# Patient Record
Sex: Female | Born: 1945
Health system: Southern US, Community
[De-identification: ages and names within clinical notes are randomized; demographics above are authoritative.]

## PROBLEM LIST (undated history)

## (undated) DIAGNOSIS — I10 Essential (primary) hypertension: Secondary | ICD-10-CM

## (undated) DIAGNOSIS — K219 Gastro-esophageal reflux disease without esophagitis: Secondary | ICD-10-CM

## (undated) DIAGNOSIS — G4730 Sleep apnea, unspecified: Secondary | ICD-10-CM

## (undated) DIAGNOSIS — I2510 Atherosclerotic heart disease of native coronary artery without angina pectoris: Secondary | ICD-10-CM

## (undated) DIAGNOSIS — I4891 Unspecified atrial fibrillation: Secondary | ICD-10-CM

## (undated) DIAGNOSIS — E785 Hyperlipidemia, unspecified: Secondary | ICD-10-CM

## (undated) DIAGNOSIS — H269 Unspecified cataract: Secondary | ICD-10-CM

## (undated) DIAGNOSIS — E039 Hypothyroidism, unspecified: Secondary | ICD-10-CM

## (undated) DIAGNOSIS — Z8719 Personal history of other diseases of the digestive system: Secondary | ICD-10-CM

## (undated) DIAGNOSIS — C50919 Malignant neoplasm of unspecified site of unspecified female breast: Secondary | ICD-10-CM

## (undated) DIAGNOSIS — M1990 Unspecified osteoarthritis, unspecified site: Secondary | ICD-10-CM

## (undated) HISTORY — DX: Hyperlipidemia, unspecified: E78.5

## (undated) HISTORY — PX: CHOLECYSTECTOMY: SHX55

## (undated) HISTORY — DX: Malignant neoplasm of unspecified site of unspecified female breast: C50.919

## (undated) HISTORY — PX: MASTECTOMY: SHX3

## (undated) HISTORY — DX: Gastro-esophageal reflux disease without esophagitis: K21.9

## (undated) HISTORY — DX: Atherosclerotic heart disease of native coronary artery without angina pectoris: I25.10

## (undated) HISTORY — PX: TUBAL LIGATION: SHX77

## (undated) HISTORY — DX: Essential (primary) hypertension: I10

## (undated) HISTORY — DX: Unspecified atrial fibrillation: I48.91

## (undated) HISTORY — DX: Hypothyroidism, unspecified: E03.9

## (undated) HISTORY — PX: APPENDECTOMY: SHX54

---

## 2010-06-04 ENCOUNTER — Encounter: Payer: Self-pay | Admitting: Physician Assistant

## 2010-06-05 ENCOUNTER — Encounter: Payer: Self-pay | Admitting: Physician Assistant

## 2010-06-05 ENCOUNTER — Encounter: Payer: Self-pay | Admitting: Cardiology

## 2010-06-05 ENCOUNTER — Encounter: Payer: Self-pay | Admitting: *Deleted

## 2010-06-05 ENCOUNTER — Ambulatory Visit (INDEPENDENT_AMBULATORY_CARE_PROVIDER_SITE_OTHER): Payer: 59 | Admitting: Cardiology

## 2010-06-05 DIAGNOSIS — E039 Hypothyroidism, unspecified: Secondary | ICD-10-CM | POA: Insufficient documentation

## 2010-06-05 DIAGNOSIS — E785 Hyperlipidemia, unspecified: Secondary | ICD-10-CM | POA: Insufficient documentation

## 2010-06-05 DIAGNOSIS — R0602 Shortness of breath: Secondary | ICD-10-CM

## 2010-06-05 DIAGNOSIS — R079 Chest pain, unspecified: Secondary | ICD-10-CM | POA: Insufficient documentation

## 2010-06-05 LAB — PROTIME-INR

## 2010-06-05 MED ORDER — NITROGLYCERIN 0.4 MG SL SUBL: 0.4000 mg | SUBLINGUAL_TABLET | SUBLINGUAL | Status: DC | PRN

## 2010-06-05 MED ORDER — METOPROLOL SUCCINATE ER 25 MG PO TB24: 25.0000 mg | ORAL_TABLET | Freq: Every day | ORAL | Status: DC

## 2010-06-05 NOTE — Progress Notes (Addendum)
HPI:  Patient is a 65 year old female, with no known history of heart disease, now referred for evaluation of new onset, exertional chest discomfort.  Patient presents with cardiac risk factors notable only for age, history of dyslipidemia, and family history of premature CAD. She denies any prior formal cardiac evaluation.  Patient states that over the past 2 weeks, or so, she has noted new-onset anterior chest discomfort with associated left bicep pain, precipitated by doing yard work. Symptoms typically resolve after 20 minutes. She denies any symptoms at rest. She denies any associated dyspnea, diaphoresis, nausea, or radiation to the jaw. She denies any similarity to occasional reflux symptoms. Of note, patient denies any similar symptoms precipitated by her usual walking, or household chores.    No Known Allergies  No current outpatient prescriptions on file prior to visit.    Past Medical History  Diagnosis Date  . Hypothyroidism   . Breast cancer   . Hyperlipidemia   . GERD (gastroesophageal reflux disease)     Past Surgical History  Procedure Date  . Mastectomy      left, 1997; S/P chemotherapy/radiation    History   Social History  . Marital Status: Married    Spouse Name: N/A    Number of Children: N/A  . Years of Education: N/A   Occupational History  . Retired    Social History Main Topics  . Smoking status: Never Smoker   . Smokeless tobacco: Never Used  . Alcohol Use: No  . Drug Use: No  . Sexually Active: Not on file   Other Topics Concern  . Not on file   Social History Narrative  . No narrative on file    Family History  Problem Relation Age of Onset  . Heart attack Father 44    ROS:  The patient denies fatigue, malaise, fever, weight gain/loss, vision loss, decreased hearing, hoarseness, chest pain, palpitations, shortness of breath, prolonged cough, wheezing, sleep apnea, coughing up blood, abdominal pain, blood in stool, nausea, vomiting,  diarrhea, heartburn, incontinence, blood in urine, muscle weakness, joint pain, leg swelling, rash, skin lesions, headache, fainting, dizziness, depression, anxiety, enlarged lymph nodes, easy bruising or bleeding, and environmental allergies.     PHYSICAL EXAM:  BP 126/85  Pulse 76  Ht 5\' 4"  (1.626 m)  Wt 197 lb (89.359 kg)  BMI 33.82 kg/m2  General: Well-developed, well-nourished, obese, in no distress head: Normocephalic and atraumatic eyes PERRLA/EOMI intact, conjunctiva and lids normal nose: No deformity or lesions mouth normal dentition, normal posterior pharynx neck: Supple, no JVD.  No masses, thyromegaly or abnormal cervical nodes lungs: Normal breath sounds bilaterally without wheezing.  Normal percussion heart: regular rate and rhythm with normal S1 and S2, no S3 or S4.  PMI is normal.  No pathological murmurs abdomen: Normal bowel sounds, abdomen is soft and nontender without masses, organomegaly or hernias noted.  No hepatosplenomegaly musculoskeletal: Back normal, normal gait muscle strength and tone normal pulsus: Pulse is normal in all 4 extremities Extremities: No peripheral pitting edema neurologic: Alert and oriented x 3 skin: Intact without lesions or rashes cervical nodes: No significant adenopathy psychologic: Normal affect  EKG:  Normal sinus rhythm at 76 bpm; borderline LAD; LVH; approximate 1 mm horizontal ST segment depression in high lateral leads, lead 2, and leads V4-V6, slightly improved from yesterday's study.    ASSESSMENT & PLAN:

## 2010-06-05 NOTE — Assessment & Plan Note (Signed)
Remote history of dyslipidemia. Currently not on medication. We'll need to reassess, following results of cardiac catheterization.

## 2010-06-05 NOTE — Assessment & Plan Note (Signed)
Followed by Dr. Howard. 

## 2010-06-05 NOTE — Assessment & Plan Note (Signed)
Recommendation is to proceed with diagnostic coronary angiography, and possible percutaneous intervention. Patient presents with symptoms worrisome for significant CAD. She denies any symptoms at rest, but does present with an abnormal EKG. We'll try to schedule this for tomorrow in the [facility] lab, or early next week. Patient is on aspirin, and we will add low-dose Toprol and p.r.n. Nitroglycerin. Patient is in agreement with this plan, which was discussed and approved in conjunction with Dr. Simona Huh

## 2010-06-05 NOTE — Progress Notes (Deleted)
   Patient ID: [name] Fry, female    DOB: 07-02-1945, 65 y.o.   MRN: [id number]  HPI    Review of Systems    Physical Exam

## 2010-06-05 NOTE — Patient Instructions (Signed)
Your physician has requested that you have a cardiac catheterization. Cardiac catheterization is used to diagnose and/or treat various heart conditions. Doctors may recommend this procedure for a number of different reasons. The most common reason is to evaluate chest pain. Chest pain can be a symptom of coronary artery disease (CAD), and cardiac catheterization can show whether plaque is narrowing or blocking your heart's arteries. This procedure is also used to evaluate the valves, as well as measure the blood flow and oxygen levels in different parts of your heart. For further information please visit [email or web address] Please follow instruction sheet, as given. Your physician recommends that you go to the [facility] for lab work and a chest x-ray. DO TODAY.

## 2010-06-06 ENCOUNTER — Observation Stay (HOSPITAL_COMMUNITY)
Admission: RE | Admit: 2010-06-06 | Discharge: 2010-06-07 | Disposition: A | Discharge status: Home / Self Care | Payer: Medicare Other | Source: Ambulatory Visit | Attending: Cardiovascular Disease | Admitting: Cardiovascular Disease

## 2010-06-06 ENCOUNTER — Inpatient Hospital Stay (HOSPITAL_BASED_OUTPATIENT_CLINIC_OR_DEPARTMENT_OTHER)
Admission: RE | Admit: 2010-06-06 | Discharge: 2010-06-06 | Disposition: A | Discharge status: Hospice | Payer: Medicare Other | Source: Ambulatory Visit | Attending: Internal Medicine | Admitting: Internal Medicine

## 2010-06-06 DIAGNOSIS — K219 Gastro-esophageal reflux disease without esophagitis: Secondary | ICD-10-CM | POA: Insufficient documentation

## 2010-06-06 DIAGNOSIS — I2510 Atherosclerotic heart disease of native coronary artery without angina pectoris: Principal | ICD-10-CM | POA: Insufficient documentation

## 2010-06-06 DIAGNOSIS — C50919 Malignant neoplasm of unspecified site of unspecified female breast: Secondary | ICD-10-CM | POA: Insufficient documentation

## 2010-06-06 DIAGNOSIS — I200 Unstable angina: Secondary | ICD-10-CM | POA: Insufficient documentation

## 2010-06-06 DIAGNOSIS — E039 Hypothyroidism, unspecified: Secondary | ICD-10-CM | POA: Insufficient documentation

## 2010-06-06 DIAGNOSIS — Z01810 Encounter for preprocedural cardiovascular examination: Secondary | ICD-10-CM | POA: Insufficient documentation

## 2010-06-06 DIAGNOSIS — E785 Hyperlipidemia, unspecified: Secondary | ICD-10-CM | POA: Insufficient documentation

## 2010-06-07 LAB — BASIC METABOLIC PANEL
BUN: 17 mg/dL (ref 6–23)
Chloride: 103 mEq/L (ref 96–112)
Creatinine, Ser: 0.8 mg/dL (ref 0.4–1.2)

## 2010-06-07 LAB — CBC
MCH: 28.5 pg (ref 26.0–34.0)
MCV: 84.1 fL (ref 78.0–100.0)
Platelets: 246 10*3/uL (ref 150–400)
RBC: 4.03 MIL/uL (ref 3.87–5.11)

## 2010-06-11 ENCOUNTER — Telehealth: Payer: Self-pay | Admitting: *Deleted

## 2010-06-11 DIAGNOSIS — E782 Mixed hyperlipidemia: Secondary | ICD-10-CM

## 2010-06-11 DIAGNOSIS — M7090 Unspecified soft tissue disorder related to use, overuse and pressure of unspecified site: Secondary | ICD-10-CM

## 2010-06-11 DIAGNOSIS — Z79899 Other long term (current) drug therapy: Secondary | ICD-10-CM

## 2010-06-11 NOTE — Telephone Encounter (Signed)
Pt states in the evening from 7pm on she feels like heart is pounding too hard. She states she can't sleep b/c of this. She denies chest pain, and actually states she feels "great" other than the pounding at night. She has been taking Metoprolol and Crestor in the evening. She will try metoprolol in the morning and Crestor at bedtime to see if this helps. She is aware a note will be sent to Gene for further review and recommendation.

## 2010-06-12 NOTE — Discharge Summary (Signed)
NAME:  [name] Fry, [name] Fry                   ACCOUNT NO.:  [id number]  MEDICAL RECORD NO.:  [id number]           PATIENT TYPE:  O  LOCATION:  6523                         FACILITY:  [facility]  PHYSICIAN:  Tereso Newcomer, PA-C     DATE OF BIRTH:  04/17/1945  DATE OF ADMISSION:  06/06/2010 DATE OF DISCHARGE:  06/07/2010                              DISCHARGE SUMMARY   CARDIOLOGIST:  Jonelle Sidle, MD  PRIMARY CARE PHYSICIAN:  Selinda Flavin.  REASON FOR ADMISSION:  Coronary artery disease.  DISCHARGE DIAGNOSES: 1. Coronary artery disease.     a.     Status post drug-eluting stent placement to the OM1 this admission. 2. Hypothyroidism. 3. Dyslipidemia. 4. Breast cancer. 5. Gastroesophageal reflux disease.  PROCEDURES PERFORMED THIS ADMISSION: 1. Cardiac catheterization by Dr. Arvilla Meres on June 06, 2010     (please see the dictated note for complete details):  Single vessel     CAD with a 99% OM1 stenosis and normal LV function with an EF of 60%. 2. Status post percutaneous coronary intervention by Dr. Tonny Bollman, June 06, 2010:  Promus Element drug-eluting stent to the     OM1 (4.0 x 16 mm).  ADMISSION HISTORY:  [name] Fry is a 65 year old female who presented to our [facility] on June 05, 2010 for the evaluation of chest discomfort with exertion.  This was felt to represent angina and outpatient cardiac catheterization was arranged.  HOSPITAL COURSE:  The patient presented for outpatient cardiac catheterization on June 06, 2010.  As noted above, she was noted to have high-grade stenosis in obtuse marginal I.  She was admitted for PCI.  This was done by Dr. Excell Seltzer with successful placement of a Promus drug-eluting stent to the obtuse marginal I.  She tolerated the procedure well without any complications.  On the morning of June 07, 2010, she was discharged in stable condition.  She was felt stable for discharge.  LABORATORY DATA:  Hemoglobin 11.5.  Sodium 136,  potassium 3.5, creatinine 0.8, glucose 105.  DISCHARGE MEDICATIONS: 1. She is to continue her amoxicillin 500 mg b.i.d. as previously     directed. 2. Aspirin 325 mg daily. 3. Plavix 75 mg daily. 4. Levothyroxine 112 mcg daily. 5. Metoprolol succinate 25 mg daily. 6. Protonix 40 mg daily - the patient has been advised to stop taking     Prilosec and start on Protonix for her GERD. Marland Kitchen .Of note, she stated     that she just started Prilosec and she was advised to use Protonix     only as needed. 7. Rosuvastatin 40 mg daily.  ALLERGIES:  No known drug allergies.  ACTIVITY:  She is to increase activity slowly.  She is to restrict lifting and sexual activity for 2 weeks.  No driving for 1 week.  DIET:  Low-fat, low-sodium diet.  WOUND CARE: 1. She should call our office in [place] for any swelling, bleeding,     bruising or fever.    FOLLOW UP: 1.  Follow up will be with Dr. Diona Browner in 2 weeks.  The office will contact with an appointment. 2.  She should follow up with Dr. Dimas Aguas as directed. 3.  The patient will need fasting lipids and LFTs drawn when she     reports to the office for her initial followup as statin therapy     was just begun in the setting of significant coronary artery     disease.  Total physician and PA time greater than 30 minutes for this discharge.     Tereso Newcomer, PA-C     SW/MEDQ  D:  06/07/2010  T:  06/07/2010  Job:  [id number]  cc:   Selinda Flavin  Electronically Signed by Tereso Newcomer PA-C on 06/08/2010 12:21:35 PM Electronically Signed by Nona Dell MD on 06/12/2010 06:51:24 PM

## 2010-06-12 NOTE — Procedures (Signed)
  NAME:  Fry, [name]                   ACCOUNT NO.:  [id number]  MEDICAL RECORD NO.:  [id number]           PATIENT TYPE:  O  LOCATION:  6523                         FACILITY:  [facility]  PHYSICIAN:  Bevelyn Buckles. Bensimhon, MDDATE OF BIRTH:  1945/09/26  DATE OF PROCEDURE:  06/06/2010 DATE OF DISCHARGE:                           CARDIAC CATHETERIZATION   PRIMARY CARE PHYSICIAN:  Dr. Selinda Flavin.  CARDIOLOGIST:  Jonelle Sidle, MD  PATIENT IDENTIFICATION:  [name] Fry is a very pleasant 65 year old with history of hyperlipidemia who has had a 2-week history of progressive chest discomfort with exertion.  She was seen by Gene Serpe yesterday and referred for cardiac catheterization.  PROCEDURES PERFORMED: 1. Selective coronary angiography. 2. Left heart catheterization. 3. Left ventriculogram.  DESCRIPTION OF PROCEDURE:  Risks and indications were explained. Consent was signed and placed on the chart.  A 4-French arterial sheath was placed in the right femoral artery using modified Seldinger technique.  Standard catheters including JL-4, 3-DRC, and angled pigtail were used.  All catheter exchanges were made over a wire.  No apparent complications.  Central aortic pressure was 130/72 with a mean of 97.  LV pressure was 130/7 with EDP of 10.  There was no aortic stenosis on pullback.  Left main was normal.  LAD had 20% narrowing proximally and about 30% narrowing in the midsection.  It gave off 2 small diagonals, is otherwise normal.  Left circumflex was made up primarily with a large branching OM-1 which fed the entire lateral wall.  There was a small distal AV groove circumflex.  In the proximal portion of the large OM-1, there was a 99% stenosis.  Right coronary artery is a large dominant vessel that gave off an RV branch and acute marginal, PDA, and 3 posterolaterals.  There was a mild 30% plaque proximally and a 20% plaque distally.  Left ventriculogram done in the RAO  position showed an EF of 60% with no regional wall motion abnormalities.  ASSESSMENT: 1. Severe one-vessel coronary artery disease with high-grade first     obtuse marginal artery lesion.  Otherwise     nonobstructive plaquing. 2. Normal left ventricular function.  PLAN/DISCUSSION:  She will need a percutaneous intervention on her OM-1 today.     Bevelyn Buckles. Bensimhon, MD     DRB/MEDQ  D:  06/06/2010  T:  06/07/2010  Job:  [id number]  cc:   Rozell Searing, PA-C Lyndle Herrlich, MD  Electronically Signed by Arvilla Meres MD on 06/12/2010 06:46:50 PM

## 2010-06-16 NOTE — Procedures (Signed)
  NAME:  Panepinto, Constanza                   ACCOUNT NO.:  [id number]  MEDICAL RECORD NO.:  [id number]           PATIENT TYPE:  O  LOCATION:  6523                         FACILITY:  [facility]  PHYSICIAN:  Veverly Fells. Excell Seltzer, MD  DATE OF BIRTH:  January 17, 1946  DATE OF PROCEDURE:  06/06/2010 DATE OF DISCHARGE:                           CARDIAC CATHETERIZATION   SURGEON:  Veverly Fells. Excell Seltzer, MD  PROCEDURE:  Percutaneous transluminal coronary angioplasty and stenting of the left circumflex.  PROCEDURAL INDICATIONS:  [name] is a very nice 65 year old woman who presented with progressive angina.  She has severe symptoms.  Her catheterization procedure was done in the Outpatient Cath Lab this morning by Dr. Gala Romney.  This demonstrated critical left circumflex/obtuse marginal stenosis with a high-grade 95% lesion.  She otherwise had nonobstructive coronary artery disease.  She was referred for PCI in the setting of single-vessel coronary artery disease.  PROCEDURAL DETAILS:  The right groin had an indwelling 4-French sheath. This was changed out to a 6-French sheath.  This was done under sterile technique.  A 6-French XB 3.5 cm guide catheter was inserted. Bivalirudin was started for anticoagulation.  The patient had been given 600 mg of Plavix in the Outpatient Cath Lab.  Initial angiography confirmed severe focal stenosis of large obtuse marginal branch of the left circumflex.  Once a therapeutic ACT was achieved, a Cougar guidewire was advanced beyond the area of critical stenosis and the vessel was predilated with a 2.5 x 12 mm apex balloon which was dilated to 10 atmospheres.  The vessel was then stented with a 4.0 x 16 Promus Element drug-eluting stent and this was deployed at 12 atmospheres.  It appeared well expanded on the edges, but the midportion of the stent over the lesion appeared somewhat under-expanded.  The stent was then postdilated with a 4.5 x 12 mm [place] Trek balloon which was  positioned in the midportion of the stent and dilated to 14 atmospheres.  Final angiography demonstrated an excellent result with 0% residual stenosis and TIMI 3 flow.  The patient tolerated the procedure well.  There were no immediate complications.  CONCLUSION:  Successful PCI of the mid left circumflex into the first obtuse marginal with a single drug-eluting stent.  RECOMMENDATIONS:  The patient should continue on dual antiplatelet therapy with aspirin and Plavix for a minimum of 12 months.     Veverly Fells. Excell Seltzer, MD     MDC/MEDQ  D:  06/06/2010  T:  06/07/2010  Job:  [id number]  cc:   Jonelle Sidle, MD Selinda Flavin  Electronically Signed by Tonny Bollman MD on 06/16/2010 01:13:22 PM

## 2010-07-04 ENCOUNTER — Ambulatory Visit (INDEPENDENT_AMBULATORY_CARE_PROVIDER_SITE_OTHER): Payer: Medicare Other | Admitting: Cardiology

## 2010-07-04 ENCOUNTER — Encounter: Payer: Self-pay | Admitting: Cardiology

## 2010-07-04 VITALS — BP 125/84 | HR 75 | Ht 64.0 in | Wt 199.0 lb

## 2010-07-04 DIAGNOSIS — E785 Hyperlipidemia, unspecified: Secondary | ICD-10-CM

## 2010-07-04 DIAGNOSIS — I2510 Atherosclerotic heart disease of native coronary artery without angina pectoris: Secondary | ICD-10-CM | POA: Insufficient documentation

## 2010-07-04 MED ORDER — ROSUVASTATIN CALCIUM 40 MG PO TABS: 20.0000 mg | ORAL_TABLET | Freq: Every day | ORAL | Status: DC

## 2010-07-04 NOTE — Progress Notes (Signed)
Clinical Summary [name] Fry is a 65 y.o.female presenting for followup. She was seen recently in late March and referral by Dr. Dimas Fry with symptoms concerning for angina, and was referred for cardiac catheterization, noted below.  She has done well since discharge. Denies any chest pain or unusual shortness of breath. States that she will not be able to do cardiac rehabilitation related to the hours, however plan to start walking regularly. She reports tolerating her medicines in general.  We reviewed her recent lipid profile liver function tests. AST was 23, ALT 25, triglycerides 240, cholesterol 151, HDL 69, LDL 34. She was initiated on high-dose Crestor at [facility].   No Known Allergies  Current outpatient prescriptions:aspirin 325 MG tablet, Take 325 mg by mouth daily.  , Disp: , Rfl: ;  clopidogrel (PLAVIX) 75 MG tablet, Take 75 mg by mouth daily.  , Disp: , Rfl: ;  levothyroxine (SYNTHROID, LEVOTHROID) 112 MCG tablet, Take 112 mcg by mouth daily.  , Disp: , Rfl: ;  metoprolol succinate (TOPROL XL) 25 MG 24 hr tablet, Take 1 tablet (25 mg total) by mouth daily., Disp: 30 tablet, Rfl: 6 nitroGLYCERIN (NITROSTAT) 0.4 MG SL tablet, Place 1 tablet (0.4 mg total) under the tongue every 5 (five) minutes as needed for chest pain., Disp: 25 tablet, Rfl: 3;  rosuvastatin (CRESTOR) 40 MG tablet, Take 40 mg by mouth at bedtime.  , Disp: , Rfl: ;  DISCONTD: aspirin 81 MG tablet, Take 81 mg by mouth daily. Take 4 tablets daily., Disp: , Rfl: ;  DISCONTD: Multiple Vitamin (MULTIVITAMIN) tablet, Take 1 tablet by mouth daily.  , Disp: , Rfl:   Past Medical History  Diagnosis Date  . Hypothyroidism   . Breast cancer   . Hyperlipidemia   . GERD (gastroesophageal reflux disease)   . Coronary atherosclerosis of native coronary artery     DES OM 3/12    Social History [name] Fry reports that she has never smoked. She has never used smokeless tobacco. [name] Fry reports that she does not drink  alcohol.  Review of Systems Occasional palpitations, no syncope. No bleeding problems. Otherwise reviewed and negative.  Physical Examination Filed Vitals:   07/04/10 1339  BP: 125/84  Pulse: 75  Overweight woman in no acute distress. HEENT: Conjunctiva and lids normal, oropharynx with moist mucosa. Neck: Supple, no elevated JVP or bruits. Lungs: Clear to auscultation, nonlabored. Cardiac: Regular rate and rhythm, no S3. Abdomen: Soft, nontender, bowel sounds present. Skin: Warm and dry. Extremities: No pitting edema. Musculoskeletal: No kyphosis. Neuropsychiatric: Alert and oriented x3, affect appropriate.   Studies Cardiac catheterization 06/07/2010: Left main was normal.      LAD had 20% narrowing proximally and about 30% narrowing in the   midsection.  It gave off 2 small diagonals, is otherwise normal.      Left circumflex was made up primarily with a large branching OM-1 which   fed the entire lateral wall.  There was a small distal AV groove   circumflex.  In the proximal portion of the large OM-1, there was a 99%   stenosis.      Right coronary artery is a large dominant vessel that gave off an RV   branch and acute marginal, PDA, and 3 posterolaterals.  There was a mild   30% plaque proximally and a 20% plaque distally.      Left ventriculogram done in the RAO position showed an EF of 60% with no   regional wall motion  abnormalities.  Problem List and Plan

## 2010-07-04 NOTE — Assessment & Plan Note (Signed)
Recent  lipid numbers reviewed. Plan to reduce Crestor to 20 mg daily. Followup fasting lipid profile and liver function tests for her next visit.

## 2010-07-04 NOTE — Assessment & Plan Note (Signed)
Patient clinically stable status post drug-eluting stent placement to the obtuse marginal in March 2012. We discussed the importance of uninterrupted dual antiplatelet therapy at this time. We also discussed diet and exercise. Routine followup to be arranged.

## 2010-07-04 NOTE — Patient Instructions (Signed)
   3 Month follow up as scheduled. Your physician recommends that you go to the [facility] for a FASTING lipid profile and liver function labs. Do not eat or drink after midnight. DO IN 3 MONTHS BEFORE NEXT OFFICE VISIT. Decrease Crestor to 20mg  every night. You may take 1/2 tablet every evening.

## 2010-10-03 ENCOUNTER — Other Ambulatory Visit: Payer: Self-pay | Admitting: Cardiology

## 2010-10-03 DIAGNOSIS — E7800 Pure hypercholesterolemia, unspecified: Secondary | ICD-10-CM

## 2010-10-03 DIAGNOSIS — R0602 Shortness of breath: Secondary | ICD-10-CM

## 2010-10-06 ENCOUNTER — Telehealth: Payer: Self-pay | Admitting: *Deleted

## 2010-10-06 NOTE — Telephone Encounter (Signed)
Spoke with [place] who states pt is asleep. She will notify pt to call.

## 2010-10-06 NOTE — Telephone Encounter (Signed)
Message copied by Arlyss Gandy on Mon Oct 06, 2010  4:06 PM ------      Message from: MCDOWELL, Illene Bolus      Created: Mon Oct 06, 2010  3:26 PM       Reviewed. AST and ALT are normal. Lipid numbers have worsened significantly however, cholesterol of 273, LDL up to 159. At the last visit the recommendation was to decrease Crestor from 40 mg daily to 20 mg daily. If she still taking the medication?

## 2010-10-07 ENCOUNTER — Ambulatory Visit: Payer: Medicare Other | Admitting: Cardiology

## 2010-10-10 ENCOUNTER — Telehealth: Payer: Self-pay | Admitting: *Deleted

## 2010-10-10 NOTE — Telephone Encounter (Signed)
Pt notified of results and verbalized understanding  

## 2010-10-10 NOTE — Telephone Encounter (Signed)
Pt states she stopped the Crestor b/c she was having so much pain in her legs that she couldn't walk. She is willing to try another medication.

## 2010-10-10 NOTE — Telephone Encounter (Signed)
[name] Fry called wanting to know if she could get results of her latest labs. Patient of Dr. Diona Browner

## 2010-10-13 NOTE — Telephone Encounter (Signed)
Noted. Since she was on Crestor 20 mg daily, one option would be to reduce the dose to 5 mg daily to see if this is better tolerated. Otherwise we could switch to a different statin altogether, perhaps atorvastatin 10 mg daily. In either case, would recheck fasting lipid profile and liver function tests in 12 weeks.

## 2010-10-13 NOTE — Telephone Encounter (Signed)
Left message with female at residence for pt to call.

## 2010-10-16 MED ORDER — ROSUVASTATIN CALCIUM 5 MG PO TABS: 5.0000 mg | ORAL_TABLET | Freq: Every day | ORAL | Status: DC

## 2010-10-16 NOTE — Telephone Encounter (Signed)
Left message to call back with female at residence who states there is no other way to reach pt.

## 2010-10-16 NOTE — Telephone Encounter (Signed)
Pt notified and verbalized understanding. She is willing to try 5 mg of Crestor. Pt given voucher for free 30 day supply.

## 2010-12-05 ENCOUNTER — Emergency Department (HOSPITAL_COMMUNITY): Payer: Medicare Other

## 2010-12-05 ENCOUNTER — Encounter (HOSPITAL_COMMUNITY): Payer: Self-pay | Admitting: Emergency Medicine

## 2010-12-05 ENCOUNTER — Other Ambulatory Visit: Payer: Self-pay

## 2010-12-05 ENCOUNTER — Emergency Department (HOSPITAL_COMMUNITY)
Admission: EM | Admit: 2010-12-05 | Discharge: 2010-12-05 | Disposition: A | Discharge status: Home / Self Care | Payer: Medicare Other | Attending: Emergency Medicine | Admitting: Emergency Medicine

## 2010-12-05 DIAGNOSIS — E039 Hypothyroidism, unspecified: Secondary | ICD-10-CM | POA: Insufficient documentation

## 2010-12-05 DIAGNOSIS — Z853 Personal history of malignant neoplasm of breast: Secondary | ICD-10-CM | POA: Insufficient documentation

## 2010-12-05 DIAGNOSIS — Z9861 Coronary angioplasty status: Secondary | ICD-10-CM | POA: Insufficient documentation

## 2010-12-05 DIAGNOSIS — R55 Syncope and collapse: Secondary | ICD-10-CM | POA: Insufficient documentation

## 2010-12-05 DIAGNOSIS — Z9010 Acquired absence of unspecified breast and nipple: Secondary | ICD-10-CM | POA: Insufficient documentation

## 2010-12-05 DIAGNOSIS — I2510 Atherosclerotic heart disease of native coronary artery without angina pectoris: Secondary | ICD-10-CM | POA: Insufficient documentation

## 2010-12-05 DIAGNOSIS — E785 Hyperlipidemia, unspecified: Secondary | ICD-10-CM | POA: Insufficient documentation

## 2010-12-05 LAB — CARDIAC PANEL(CRET KIN+CKTOT+MB+TROPI)
CK, MB: 1.9 ng/mL (ref 0.3–4.0)
Relative Index: INVALID (ref 0.0–2.5)
Total CK: 83 U/L (ref 7–177)
Troponin I: <0.3 ng/mL (ref <0.30)

## 2010-12-05 LAB — DIFFERENTIAL
Lymphocytes Relative: 29 % (ref 12–46)
Lymphs Abs: 2 10*3/uL (ref 0.7–4.0)
Monocytes Relative: 13 % — ABNORMAL HIGH (ref 3–12)
Neutrophils Relative %: 56 % (ref 43–77)

## 2010-12-05 LAB — BASIC METABOLIC PANEL
CO2: 24 mEq/L (ref 19–32)
Calcium: 9.7 mg/dL (ref 8.4–10.5)
GFR calc non Af Amer: >60 mL/min (ref >60)
Glucose, Bld: 124 mg/dL — ABNORMAL HIGH (ref 70–99)
Potassium: 3.7 mEq/L (ref 3.5–5.1)
Sodium: 137 mEq/L (ref 135–145)

## 2010-12-05 LAB — CBC
Hemoglobin: 13.1 g/dL (ref 12.0–15.0)
MCH: 28.9 pg (ref 26.0–34.0)
Platelets: 235 10*3/uL (ref 150–400)
RBC: 4.54 MIL/uL (ref 3.87–5.11)
WBC: 6.7 10*3/uL (ref 4.0–10.5)

## 2010-12-05 MED ORDER — SODIUM CHLORIDE 0.9 % IV BOLUS (SEPSIS)
500.0000 mL | Freq: Once | INTRAVENOUS | Status: AC
  Administered 2010-12-05: 1000 mL via INTRAVENOUS

## 2010-12-05 MED ORDER — SODIUM CHLORIDE 0.9 % IV SOLN: Freq: Once | INTRAVENOUS | Status: DC

## 2010-12-05 MED ORDER — SODIUM CHLORIDE 0.9 % IV SOLN: INTRAVENOUS | Status: DC

## 2010-12-05 NOTE — ED Provider Notes (Signed)
History   Chart scribed for Donnetta Hutching, MD by Enos Fling; the patient was seen in room APA07/APA07; this patient's care was started at 12:20 PM.    CSN: [id number] Arrival date & time: 12/05/2010 12:30 PM  Chief Complaint  Patient presents with  . Near Syncope    HPI  The history is limited by the condition of the patient.  Level 5 caveat for urgent need for intervention [name] Fry is a 65 y.o. female who presents to the Emergency Department complaining of syncope. Pt has been visiting her mother in the ED for the past several hours; she was sitting at the bedside when she suddenly became very weak, diaphoretic and minimally responsive. This was witnessed by her daughter who denies any h/o syncopal episodes for pt. Pt denies cp or sob. Admits to "sinus issues" over last few days but otherwise has been well.     Past Medical History  Diagnosis Date  . Hypothyroidism   . Breast cancer   . Hyperlipidemia   . GERD (gastroesophageal reflux disease)   . Coronary atherosclerosis of native coronary artery     DES OM 3/12    Past Surgical History  Procedure Date  . Mastectomy      left, 1997; S/P chemotherapy/radiation  . Coronary angioplasty with stent placement   . Cholecystectomy     Family History  Problem Relation Age of Onset  . Heart attack Father 80    History  Substance Use Topics  . Smoking status: Never Smoker   . Smokeless tobacco: Never Used  . Alcohol Use: No    OB History    Grav Para Term Preterm Abortions TAB SAB Ect Mult Living                  Review of Systems  Review of Systems  Unable to perform ROS Level 5 caveat for urgent need for intervention  Allergies  Review of patient's allergies indicates no known allergies.  Home Medications   Current Outpatient Rx  Name Route Sig Dispense Refill  . AMOXICILLIN 500 MG PO CAPS Oral Take 1,500 mg by mouth 2 (two) times daily.      . ASPIRIN 325 MG PO TABS Oral Take 325 mg by mouth at bedtime.      . CLOPIDOGREL BISULFATE 75 MG PO TABS Oral Take 75 mg by mouth daily.      Marland Kitchen LEVOTHYROXINE SODIUM 112 MCG PO TABS Oral Take 112 mcg by mouth daily.     Marland Kitchen METOPROLOL SUCCINATE 25 MG PO TB24 Oral Take 1 tablet (25 mg total) by mouth daily. 30 tablet 6  . NAPROXEN SODIUM 220 MG PO TABS Oral Take 220 mg by mouth 2 (two) times daily with a meal. Pain     . ROSUVASTATIN CALCIUM 5 MG PO TABS Oral Take 1 tablet (5 mg total) by mouth at bedtime. 30 tablet 30  . NITROGLYCERIN 0.4 MG SL SUBL Sublingual Place 1 tablet (0.4 mg total) under the tongue every 5 (five) minutes as needed for chest pain. 25 tablet 3    Physical Exam    BP 113/77  Pulse 75  Resp 16  SpO2 100%  Physical Exam  Nursing note and vitals reviewed. Constitutional: She is oriented to person, place, and time. She appears distressed.       Appears very weak; Appearance consistent with age of record  HENT:  Head: Normocephalic and atraumatic.  Right Ear: External ear normal.  Left Ear: External  ear normal.  Nose: Nose normal.  Mouth/Throat: Oropharynx is clear and moist.  Eyes: Conjunctivae are normal.  Neck: Neck supple.  Cardiovascular: Normal rate and regular rhythm.  Exam reveals no gallop and no friction rub.   No murmur heard. Pulmonary/Chest: Effort normal and breath sounds normal. She has no wheezes. She has no rhonchi. She has no rales. She exhibits no tenderness.  Abdominal: Soft. There is no tenderness.  Musculoskeletal: Normal range of motion.       Normal appearance of extremities  Neurological: She is alert and oriented to person, place, and time. No sensory deficit.  Skin: No rash noted. There is pallor.       Mildly diaphoretic  Psychiatric: She has a normal mood and affect.    ED Course  Procedures - none  OTHER DATA REVIEWED: Nursing notes and vital signs reviewed. Prior records reviewed.   DIAGNOSTIC STUDIES:  EKG:   Date: 12/05/2010  Rate: 59  Rhythm: sinus bradycardia  QRS Axis:  normal  Intervals: normal  ST/T Wave abnormalities: nonspecific ST changes  Conduction Disutrbances:none  Narrative Interpretation:   Old EKG Reviewed: changes noted (06/07/10, NSR, rate 65)    LABS / RADIOLOGY: Results for orders placed during the hospital encounter of 12/05/10  BASIC METABOLIC PANEL      Component Value Range   Sodium 137  135 - 145 (mEq/L)   Potassium 3.7  3.5 - 5.1 (mEq/L)   Chloride 101  96 - 112 (mEq/L)   CO2 24  19 - 32 (mEq/L)   Glucose, Bld 124 (*) 70 - 99 (mg/dL)   BUN 13  6 - 23 (mg/dL)   Creatinine, Ser 1.19  0.50 - 1.10 (mg/dL)   Calcium 9.7  8.4 - 14.7 (mg/dL)   GFR calc non Af Amer >60  >60 (mL/min)   GFR calc Af Amer >60  >60 (mL/min)  CBC      Component Value Range   WBC 6.7  4.0 - 10.5 (K/uL)   RBC 4.54  3.87 - 5.11 (MIL/uL)   Hemoglobin 13.1  12.0 - 15.0 (g/dL)   HCT 82.9  56.2 - 13.0 (%)   MCV 83.7  78.0 - 100.0 (fL)   MCH 28.9  26.0 - 34.0 (pg)   MCHC 34.5  30.0 - 36.0 (g/dL)   RDW 86.5  78.4 - 69.6 (%)   Platelets 235  150 - 400 (K/uL)  DIFFERENTIAL      Component Value Range   Neutrophils Relative 56  43 - 77 (%)   Neutro Abs 3.8  1.7 - 7.7 (K/uL)   Lymphocytes Relative 29  12 - 46 (%)   Lymphs Abs 2.0  0.7 - 4.0 (K/uL)   Monocytes Relative 13 (*) 3 - 12 (%)   Monocytes Absolute 0.9  0.1 - 1.0 (K/uL)   Eosinophils Relative 1  0 - 5 (%)   Eosinophils Absolute 0.0  0.0 - 0.7 (K/uL)   Basophils Relative 1  0 - 1 (%)   Basophils Absolute 0.1  0.0 - 0.1 (K/uL)  CARDIAC PANEL(CRET KIN+CKTOT+MB+TROPI)      Component Value Range   Total CK 83  7 - 177 (U/L)   CK, MB 1.9  0.3 - 4.0 (ng/mL)   Troponin I <0.30  <0.30 (ng/mL)   Relative Index RELATIVE INDEX IS INVALID  0.0 - 2.5     Dg Chest Portable 1 View  12/05/2010  *RADIOLOGY REPORT*  Clinical Data: Presyncopal episode.  PORTABLE CHEST - 1  VIEW  Comparison: None.  Findings: Heart size and vascularity are normal and the lungs are clear.  No osseous abnormality. Surgical clips in the  left axilla.  IMPRESSION: No acute disease.  Original Report Authenticated By: Gwynn Burly, M.D.     ED COURSE: 12:20 PM - Pt has been in ED for several hours with mother who fractured her hip this AM. Pt sitting at bedside when she suddenly had near-syncopal episode and became very weak and diaphoretic; pt denied cp or sob. Pt's daughter present during episode. Placed pt in bed and moved to room 7. Will give IVFs and order EKG, Bmet, CBC, cardiac markers, and chest x-ray. Pt's BP and heart rate checked at onset of symptoms- BP 88/50 and HR 45 1:45 PM - Pt feeling better after 500 ml bolus IVFs, will give more fluids and discharge if still feeling well. All results reviewed and are benign.  MDM: Patient was in emergency department today with her mother who had fractured her hip. Patient became presyncopal. She was monitored and hydrated. EKG and labs done. Patient feeling back to normal on discharge.  IMPRESSION: No diagnosis found.   PLAN: Discharge home with PCP follow up All results reviewed and discussed with pt, questions answered, pt agreeable with plan.   MEDS GIVEN IN ED:  Medications  0.9 %  sodium chloride infusion (not administered)  sodium chloride 0.9 % bolus 500 mL (1000 mL Intravenous Given 12/05/10 1235)  sodium chloride 0.9 % bolus 500 mL (1000 mL Intravenous Given 12/05/10 1351)  0.9 %  sodium chloride infusion (not administered)  naproxen sodium (ALEVE) 220 MG tablet (not administered)  amoxicillin (AMOXIL) 500 MG capsule (not administered)     DISCHARGE MEDICATIONS: New Prescriptions   No medications on file     SCRIBE ATTESTATION: I personally performed the services described in this documentation, which was scribed in my presence. The recorded information has been reviewed and considered. Donnetta Hutching, MD          Donnetta Hutching, MD 12/05/10 [phone]

## 2010-12-05 NOTE — ED Notes (Signed)
Pt repositioned in bed. Skin is pink in color. Pt is alert and oriented, stating she feels better. NAD. Warm blanket provided. NAD.

## 2010-12-05 NOTE — ED Notes (Signed)
Pt visiting mother in ED and became weak, almost unresponsive. In room and pt pale, diaphoretic but responding, staring out. Tries to follow commands. Lips slightly cyanotic. Pt placed in room 7. Pt still responding. Denies pain or sob. Pt c/o being real weak all over. Pt moving all extremities. No s/s of stroke at this time. Pupils perrla.

## 2010-12-05 NOTE — ED Notes (Signed)
cbg 122 

## 2010-12-08 ENCOUNTER — Ambulatory Visit: Payer: Medicare Other | Admitting: Cardiology

## 2010-12-11 LAB — GLUCOSE, CAPILLARY: Glucose-Capillary: 122 mg/dL — ABNORMAL HIGH (ref 70–99)

## 2010-12-23 ENCOUNTER — Telehealth: Payer: Self-pay | Admitting: *Deleted

## 2010-12-23 DIAGNOSIS — E785 Hyperlipidemia, unspecified: Secondary | ICD-10-CM

## 2010-12-23 DIAGNOSIS — Z79899 Other long term (current) drug therapy: Secondary | ICD-10-CM

## 2010-12-23 NOTE — Telephone Encounter (Signed)
Pt left message on voicemail asking for a return call. She would like to know if she needs labs before her next appt on Nov 20th.   Left message to call back on voicemail.  (Pt could repeat FLP/LFT before appt if she wanted too.)

## 2010-12-24 NOTE — Telephone Encounter (Signed)
Pt left message on voice mail asking for return call.  Returned call and left message to call back on voicemail.

## 2010-12-24 NOTE — Telephone Encounter (Signed)
Pt returned call while nurse on the phone with another patient. When attempted to call pt back within about 10 minutes of her call, received voicemail.

## 2010-12-26 NOTE — Telephone Encounter (Signed)
Pt walked into the office. Order given for labs.

## 2011-01-31 ENCOUNTER — Other Ambulatory Visit: Payer: Self-pay | Admitting: Physician Assistant

## 2011-02-03 ENCOUNTER — Encounter: Payer: Self-pay | Admitting: Cardiology

## 2011-02-03 ENCOUNTER — Ambulatory Visit (INDEPENDENT_AMBULATORY_CARE_PROVIDER_SITE_OTHER): Payer: Medicare Other | Admitting: Cardiology

## 2011-02-03 VITALS — BP 138/86 | HR 73 | Ht 64.0 in | Wt 200.4 lb

## 2011-02-03 DIAGNOSIS — I2510 Atherosclerotic heart disease of native coronary artery without angina pectoris: Secondary | ICD-10-CM

## 2011-02-03 DIAGNOSIS — E785 Hyperlipidemia, unspecified: Secondary | ICD-10-CM

## 2011-02-03 MED ORDER — FISH OIL 1000 MG PO CAPS: 1.0000 | ORAL_CAPSULE | Freq: Two times a day (BID) | ORAL | Status: DC

## 2011-02-03 MED ORDER — ASPIRIN EC 81 MG PO TBEC: 81.0000 mg | DELAYED_RELEASE_TABLET | Freq: Every day | ORAL | Status: DC

## 2011-02-03 NOTE — Assessment & Plan Note (Signed)
Symptomatically stable on medical therapy. Reinforced importance of DAPT. Also discussed walking regimen. Continue observation.

## 2011-02-03 NOTE — Progress Notes (Signed)
Clinical Summary [name] Fry is a 65 y.o.female presenting for followup. She was seen in April.  Recent lab work showed normal LFTs, cholesterol 191, triglycerides 368, HDL 64, LDL 53. She reports compliance low dose Crestor. We did discuss omega-3 supplements.  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 We discussed trying to get in some walking  She states she was having some reflux symptoms and decided to stop her aspirin a few weeks ago. I reviewed the importance of continuing the DAPT in light of her drug-eluting stent, and I recommended that she go back on aspirin 81 mg daily, taken with meals.  No Known Allergies  Medication list reviewed.  Past Medical History  Diagnosis Date  . Hypothyroidism   . Breast cancer   . Hyperlipidemia   . GERD (gastroesophageal reflux disease)   . Coronary atherosclerosis of native coronary artery     DES OM 3/12    Past Surgical History  Procedure Date  . Mastectomy      left, 1997; S/P chemotherapy/radiation  . Coronary angioplasty with stent placement   . Cholecystectomy     Family History  Problem Relation Age of Onset  . Heart attack Father 70    Social History [name] Fry reports that she has never smoked. She has never used smokeless tobacco. [name] Fry reports that she does not drink alcohol.  Review of Systems No palpitations, dizziness, syncope. No unusual bleeding problems. Otherwise negative except as outlined.  Physical Examination Filed Vitals:   02/03/11 0938  BP: 138/86  Pulse: 73    Overweight woman in no acute distress.  HEENT: Conjunctiva and lids normal, oropharynx with moist mucosa.  Neck: Supple, no elevated JVP or bruits.  Lungs: Clear to auscultation, nonlabored.  Cardiac: Regular rate and rhythm, no S3.  Abdomen: Soft, nontender, bowel sounds present.  Skin: Warm and dry.  Extremities: No pitting edema.    Problem List and Plan

## 2011-02-03 NOTE — Assessment & Plan Note (Signed)
Continue low-dose Crestor and add omega-3 supplements beginning at 1000 mg p.o. B.i.d. Followup fasting lipid profile and liver function tests for next visit.

## 2011-02-03 NOTE — Patient Instructions (Signed)
Your physician wants you to follow-up in: 6 months. You will receive a reminder letter in the mail one-two months in advance. If you don't receive a letter, please call our office to schedule the follow-up appointment. Your physician recommends that you go to the [facility] for a FASTING lipid profile and liver function labs. Do not eat or drink after midnight. DO IN 6 MONTHS BEFORE YOUR APPT. Omega 3 Fish Oil 1000 mg two times a day. Coated Aspirin 81 mg daily.

## 2011-06-08 ENCOUNTER — Other Ambulatory Visit: Payer: Self-pay | Admitting: *Deleted

## 2011-06-08 MED ORDER — CLOPIDOGREL BISULFATE 75 MG PO TABS: 75.0000 mg | ORAL_TABLET | Freq: Every day | ORAL | Status: DC

## 2011-07-13 ENCOUNTER — Other Ambulatory Visit: Payer: Self-pay | Admitting: *Deleted

## 2011-07-13 DIAGNOSIS — E782 Mixed hyperlipidemia: Secondary | ICD-10-CM

## 2011-07-13 DIAGNOSIS — Z79899 Other long term (current) drug therapy: Secondary | ICD-10-CM

## 2011-08-05 ENCOUNTER — Telehealth: Payer: Self-pay | Admitting: *Deleted

## 2011-08-05 NOTE — Telephone Encounter (Signed)
Patient informed and said she stopped taking crestor about 3 months ago and doesn't take the fish oil on a regular basis. Nurse informed patient that this would be discussed further at office visit.

## 2011-08-05 NOTE — Telephone Encounter (Signed)
Message copied by Eustace Moore on Wed Aug 05, 2011 10:37 AM ------      Message from: Jonelle Sidle      Created: Tue Aug 04, 2011  8:09 AM       Reviewed. Total cholesterol has increased to 260, triglycerides now at 317, and LDL up significantly to 148. At the last visit we added omega-3 to her Crestor regimen. Please check on compliance and we can discuss further at OV.

## 2011-08-05 NOTE — Telephone Encounter (Signed)
Left message for patient to call office.  

## 2011-08-06 ENCOUNTER — Encounter: Payer: Self-pay | Admitting: Cardiology

## 2011-08-06 ENCOUNTER — Ambulatory Visit (INDEPENDENT_AMBULATORY_CARE_PROVIDER_SITE_OTHER): Payer: Medicare Other | Admitting: Cardiology

## 2011-08-06 VITALS — BP 136/92 | HR 70 | Ht 64.0 in | Wt 205.4 lb

## 2011-08-06 DIAGNOSIS — E785 Hyperlipidemia, unspecified: Secondary | ICD-10-CM

## 2011-08-06 DIAGNOSIS — I2510 Atherosclerotic heart disease of native coronary artery without angina pectoris: Secondary | ICD-10-CM

## 2011-08-06 MED ORDER — SIMVASTATIN 10 MG PO TABS: 10.0000 mg | ORAL_TABLET | Freq: Every day | ORAL | Status: DC

## 2011-08-06 NOTE — Patient Instructions (Addendum)
Your physician recommends that you schedule a follow-up appointment in: 6 months. You will receive a reminder letter in the mail in about 4 months reminding you to call and schedule your appointment. If you don't receive this letter, please contact our office.   Your physician recommends that you return for a FASTING lipid/liver profile: in 3 months ONLY IF YOU ARE ABLE TO TOLERATE SIMVASTATIN.  Your physician has recommended you make the following change in your medication: START SIMVASTATIN 10 MG. Take one tablet every other day,then gradually increase to daily if your are able to tolerate this. Your new prescription has been sent to your pharmacy.

## 2011-08-06 NOTE — Progress Notes (Signed)
   Clinical Summary Ms. Braaten is a 66 y.o.female presenting for followup. She was seen in November 2012.  Recent lab work reviewed showing normal LFTs, triglycerides 317, cholesterol 260, HDL 49, LDL 148. This is a significant change compared to last values. Patient states that she has not been taking her Crestor over the last 3 months. She reports achiness which has improved off the medicine.  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 Complains of being fatigued. No chest pain.  Denies any bleeding problems on DAPT.  No Known Allergies  Current Outpatient Prescriptions  Medication Sig Dispense Refill  . aspirin EC 81 MG tablet Take 81 mg by mouth daily. Take 2 tablets daily. One in the morning and one in the evening.      . clopidogrel (PLAVIX) 75 MG tablet Take 1 tablet (75 mg total) by mouth daily.  30 tablet  6  . levothyroxine (SYNTHROID, LEVOTHROID) 112 MCG tablet Take 112 mcg by mouth daily.       . metoprolol succinate (TOPROL-XL) 25 MG 24 hr tablet TAKE 1 TABLET BY MOUTH EVERY DAY  30 tablet  6  . naproxen sodium (ALEVE) 220 MG tablet Take 220 mg by mouth as needed. Pain      . Omega-3 Fatty Acids (FISH OIL) 1000 MG CAPS Take 1 capsule by mouth as needed.      . simvastatin (ZOCOR) 10 MG tablet Take 1 tablet (10 mg total) by mouth at bedtime.  30 tablet  3  . DISCONTD: simvastatin (ZOCOR) 10 MG tablet Take 10 mg by mouth at bedtime.      . nitroGLYCERIN (NITROSTAT) 0.4 MG SL tablet Place 1 tablet (0.4 mg total) under the tongue every 5 (five) minutes as needed for chest pain.  25 tablet  3    Past Medical History  Diagnosis Date  . Hypothyroidism   . Breast cancer   . Hyperlipidemia   . GERD (gastroesophageal reflux disease)   . Coronary atherosclerosis of native coronary artery     DES OM 3/12    Social History Ms. Lemma reports that she has never smoked. She has never used smokeless tobacco. Ms. Olvera reports that she does not  drink alcohol.  Review of Systems Stable appetite. No cough, fevers or chills. Otherwise negative except as outlined.  Physical Examination Filed Vitals:   08/06/11 1423  BP: 136/92  Pulse: 70    Overweight woman in no acute distress.  HEENT: Conjunctiva and lids normal, oropharynx with moist mucosa.  Neck: Supple, no elevated JVP or bruits.  Lungs: Clear to auscultation, nonlabored.  Cardiac: Regular rate and rhythm, no S3.  Abdomen: Soft, nontender, bowel sounds present.  Skin: Warm and dry.  Extremities: No pitting edema.    ECG Sinus rhythm with LVH, PAC, nonspecific ST changes.  Problem List and Plan   Coronary atherosclerosis of native coronary artery Symptomatically stable on medical therapy. Followup ECG reviewed. Continue observation.  Hyperlipidemia Discussed her lipid status today. Plan to try simvastatin beginning at 10 mg every other day, advancing to daily over a few weeks. If she tolerates this, would check FLP and LFT in 3 months.     Jonelle Sidle, M.D., F.A.C.C.

## 2011-08-06 NOTE — Assessment & Plan Note (Signed)
Discussed her lipid status today. Plan to try simvastatin beginning at 10 mg every other day, advancing to daily over a few weeks. If she tolerates this, would check FLP and LFT in 3 months.

## 2011-08-06 NOTE — Assessment & Plan Note (Signed)
Symptomatically stable on medical therapy. Followup ECG reviewed. Continue observation. 

## 2011-09-07 ENCOUNTER — Other Ambulatory Visit: Payer: Self-pay | Admitting: Cardiology

## 2011-11-18 ENCOUNTER — Telehealth: Payer: Self-pay | Admitting: *Deleted

## 2011-11-18 DIAGNOSIS — E785 Hyperlipidemia, unspecified: Secondary | ICD-10-CM

## 2011-11-18 DIAGNOSIS — Z79899 Other long term (current) drug therapy: Secondary | ICD-10-CM

## 2011-11-18 NOTE — Telephone Encounter (Signed)
Message copied by Eustace Moore on Wed Nov 18, 2011 10:03 AM ------      Message from: Eustace Moore      Created: Thu Aug 06, 2011  2:54 PM      Regarding: FLP/LFT IN 3 MONTHS ONLY IF PATIENT ABLE TO TOLERATE ZOCOR       IF SHE DOESN'T TAKE THE MEDS/NO LABS NEEDED IN 3 MONTHS PER MD

## 2011-11-18 NOTE — Telephone Encounter (Signed)
Spoke with patient and she states she is able to tolerate the simvastatin much better than the crestor. Patient states she has some leg pain but its not intolerable. Nurse advised patient that she needed to have f/u labs and the orders would be faxed to [facility] lab. Patient verbalized understanding.

## 2011-11-25 ENCOUNTER — Telehealth: Payer: Self-pay | Admitting: *Deleted

## 2011-11-25 NOTE — Telephone Encounter (Signed)
Patient informed. 

## 2011-11-25 NOTE — Telephone Encounter (Signed)
Message copied by Eustace Moore on Wed Nov 25, 2011  4:15 PM ------      Message from: MCDOWELL, Illene Bolus      Created: Tue Nov 24, 2011  8:53 AM       LFTs normal. Cholesterol has come down significantly to 180, LDL at goal at 74. Triglycerides are mildly elevated 241, but much better than last time.

## 2012-01-06 ENCOUNTER — Other Ambulatory Visit: Payer: Self-pay | Admitting: Cardiology

## 2012-01-06 MED ORDER — SIMVASTATIN 10 MG PO TABS: 10.0000 mg | ORAL_TABLET | Freq: Every day | ORAL | Status: DC

## 2012-02-08 ENCOUNTER — Encounter: Payer: Self-pay | Admitting: Cardiology

## 2012-02-08 ENCOUNTER — Ambulatory Visit (INDEPENDENT_AMBULATORY_CARE_PROVIDER_SITE_OTHER): Payer: Medicare Other | Admitting: Cardiology

## 2012-02-08 VITALS — BP 149/90 | HR 82 | Ht 64.0 in | Wt 203.0 lb

## 2012-02-08 DIAGNOSIS — E785 Hyperlipidemia, unspecified: Secondary | ICD-10-CM

## 2012-02-08 DIAGNOSIS — I2510 Atherosclerotic heart disease of native coronary artery without angina pectoris: Secondary | ICD-10-CM

## 2012-02-08 NOTE — Assessment & Plan Note (Signed)
Symptomatically stable on medical therapy. I encouraged regular exercise, observation for now. Followup is arranged.

## 2012-02-08 NOTE — Progress Notes (Signed)
   Clinical Summary Ms. Dutta is a 66 y.o.female presenting for followup. She was seen in May. She reports no angina, has not required any nitroglycerin. She reports compliance with her medications, has generally tolerated Zocor much better than Crestor. 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  Followup lab work in September showed normal LFTs with improvement in lipid status on Zocor, triglycerides 241, cholesterol 180, HDL 58, and LDL 74. We reviewed this today.  No Known Allergies  Current Outpatient Prescriptions  Medication Sig Dispense Refill  . aspirin EC 81 MG tablet Take 81 mg by mouth daily.      . clopidogrel (PLAVIX) 75 MG tablet Take 1 tablet (75 mg total) by mouth daily.  30 tablet  6  . levothyroxine (SYNTHROID, LEVOTHROID) 112 MCG tablet Take 112 mcg by mouth daily.       . metoprolol succinate (TOPROL-XL) 25 MG 24 hr tablet TAKE 1 TABLET BY MOUTH EVERY DAY  30 tablet  6  . Multiple Vitamin (MULTIVITAMIN) tablet Take 1 tablet by mouth daily.      . naproxen sodium (ALEVE) 220 MG tablet Take 220 mg by mouth as needed. Pain      . Omega-3 Fatty Acids (FISH OIL) 1000 MG CAPS Take 1 capsule by mouth as needed.      . simvastatin (ZOCOR) 10 MG tablet Take 1 tablet (10 mg total) by mouth at bedtime.  30 tablet  3  . nitroGLYCERIN (NITROSTAT) 0.4 MG SL tablet Place 1 tablet (0.4 mg total) under the tongue every 5 (five) minutes as needed for chest pain.  25 tablet  3    Past Medical History  Diagnosis Date  . Hypothyroidism   . Breast cancer   . Hyperlipidemia   . GERD (gastroesophageal reflux disease)   . Coronary atherosclerosis of native coronary artery     DES OM 3/12    Social History Ms. Emery reports that she has never smoked. She has never used smokeless tobacco. Ms. Harewood reports that she does not drink alcohol.  Review of Systems No  reported bleeding episodes, occasional leg pain and achiness. No orthopnea or PND. No palpitations. Otherwise negative.  Physical Examination Filed Vitals:   02/08/12 1301  BP: 149/90  Pulse: 82   Filed Weights   02/08/12 1301  Weight: 203 lb (92.08 kg)    HEENT: Conjunctiva and lids normal, oropharynx with moist mucosa.  Neck: Supple, no elevated JVP or bruits.  Lungs: Clear to auscultation, nonlabored.  Cardiac: Regular rate and rhythm, no S3.  Abdomen: Soft, nontender, bowel sounds present.  Skin: Warm and dry.  Extremities: No pitting edema.    Problem List and Plan   Coronary atherosclerosis of native coronary artery Symptomatically stable on medical therapy. I encouraged regular exercise, observation for now. Followup is arranged.  Hyperlipidemia Good lipid control on low-dose Zocor. No changes made. Followup FLP and LFT for next visit.    Jonelle Sidle, M.D., F.A.C.C.

## 2012-02-08 NOTE — Patient Instructions (Addendum)
Your physician recommends that you schedule a follow-up appointment in: 6 months. You will receive a reminder letter in the mail in about 4 months reminding you to call and schedule your appointment. If you don't receive this letter, please contact our office.  Your physician recommends that you continue on your current medications as directed. Please refer to the Current Medication list given to you today.  Your physician recommends that you return for a FASTING lipid/liver profile in 6 months just before your next visit is due. You will receive a reminder letter/call in about 4 months reminding you to call and have this done. If you don't receive this letter/call, please contact our office.

## 2012-02-08 NOTE — Assessment & Plan Note (Signed)
Good lipid control on low-dose Zocor. No changes made. Followup FLP and LFT for next visit.

## 2012-03-06 ENCOUNTER — Other Ambulatory Visit: Payer: Self-pay | Admitting: Cardiology

## 2012-03-07 NOTE — Telephone Encounter (Signed)
Please advise [name] Fry pt

## 2012-03-24 ENCOUNTER — Other Ambulatory Visit: Payer: Self-pay | Admitting: Cardiology

## 2012-03-24 MED ORDER — METOPROLOL SUCCINATE ER 25 MG PO TB24: 25.0000 mg | ORAL_TABLET | Freq: Every day | ORAL | Status: DC

## 2012-03-24 MED ORDER — SIMVASTATIN 10 MG PO TABS: 10.0000 mg | ORAL_TABLET | Freq: Every day | ORAL | Status: DC

## 2012-05-22 ENCOUNTER — Other Ambulatory Visit: Payer: Self-pay | Admitting: Physician Assistant

## 2012-07-28 ENCOUNTER — Encounter (INDEPENDENT_AMBULATORY_CARE_PROVIDER_SITE_OTHER): Payer: Medicare Other | Admitting: Ophthalmology

## 2012-07-28 DIAGNOSIS — H35379 Puckering of macula, unspecified eye: Secondary | ICD-10-CM

## 2012-07-28 DIAGNOSIS — H43819 Vitreous degeneration, unspecified eye: Secondary | ICD-10-CM

## 2012-07-28 DIAGNOSIS — I10 Essential (primary) hypertension: Secondary | ICD-10-CM

## 2012-07-28 DIAGNOSIS — H35039 Hypertensive retinopathy, unspecified eye: Secondary | ICD-10-CM

## 2012-07-28 DIAGNOSIS — H3530 Unspecified macular degeneration: Secondary | ICD-10-CM

## 2012-08-11 ENCOUNTER — Other Ambulatory Visit: Payer: Self-pay | Admitting: Cardiology

## 2012-09-13 ENCOUNTER — Encounter (HOSPITAL_COMMUNITY): Payer: Self-pay | Admitting: Pharmacy Technician

## 2012-09-13 ENCOUNTER — Other Ambulatory Visit: Payer: Self-pay | Admitting: Cardiology

## 2012-09-13 NOTE — Telephone Encounter (Signed)
Medication sent via escribe for clopidogrel (PLAVIX) 75 MG tablet.

## 2012-09-19 ENCOUNTER — Ambulatory Visit (INDEPENDENT_AMBULATORY_CARE_PROVIDER_SITE_OTHER): Payer: Medicare Other | Admitting: Cardiology

## 2012-09-19 ENCOUNTER — Encounter: Payer: Self-pay | Admitting: Cardiology

## 2012-09-19 VITALS — BP 147/86 | HR 76 | Ht 63.0 in | Wt 202.0 lb

## 2012-09-19 DIAGNOSIS — E785 Hyperlipidemia, unspecified: Secondary | ICD-10-CM

## 2012-09-19 DIAGNOSIS — I2510 Atherosclerotic heart disease of native coronary artery without angina pectoris: Secondary | ICD-10-CM

## 2012-09-19 NOTE — Assessment & Plan Note (Signed)
Symptomatically stable on medical therapy, ECG reviewed. Should have an acceptable perioperative cardiac risk for upcoming eye surgery, and reasonable to hold Plavix for the procedure. We will in fact plan not to resume Plavix afterward, she should go on a full dose aspirin daily at that time. Otherwise continue observation.

## 2012-09-19 NOTE — Assessment & Plan Note (Signed)
Will hold Zocor for a few weeks to see if her leg discomfort improves. If so resume Zocor to see if symptoms return. If this is the case, we can discuss considering a different treatment option.

## 2012-09-19 NOTE — Patient Instructions (Addendum)
After your eye procedure you will continue off the Plavix and just start Asprin 325 mg daily  Ok to hold your Simvastatin (Zocor) for 2 to 3 weeks as discussed. Call back with update  Your physician wants you to follow-up in: 6 months You will receive a reminder letter in the mail two months in advance. If you don't receive a letter, please call our office to schedule the follow-up appointment.

## 2012-09-19 NOTE — Progress Notes (Signed)
   Clinical Summary [name] Fry is a 67 y.o.female last seen in November 2013. 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 Does not report any progressive angina symptoms. She will be having eye surgery in the near future with Dr. Ashley Royalty. She will be coming off Plavix for the procedure.  Cardiac medications reviewed. We discussed not resuming Plavix after her eye surgery since she is greater than 2 years out from previous DES to the OM. ECG reviewed and stable showing sinus rhythm with increased voltage.  She has been having some leg discomfort, questioned whether it could be related to Zocor.   No Known Allergies  Current Outpatient Prescriptions  Medication Sig Dispense Refill  . aspirin EC 81 MG tablet Take 81 mg by mouth as needed.       . cholecalciferol (VITAMIN D) 1000 UNITS tablet Take 1,000 Units by mouth every 7 (seven) days.       . clopidogrel (PLAVIX) 75 MG tablet TAKE 1 TABLET BY MOUTH EVERY DAY  30 tablet  0  . levothyroxine (SYNTHROID, LEVOTHROID) 112 MCG tablet Take 112 mcg by mouth daily.       . metoprolol succinate (TOPROL-XL) 25 MG 24 hr tablet Take 25 mg by mouth daily.      . Multiple Vitamins-Minerals (OCUVITE PRESERVISION PO) Take 2 tablets by mouth daily.      . nitroGLYCERIN (NITROSTAT) 0.4 MG SL tablet Place 0.4 mg under the tongue every 5 (five) minutes as needed for chest pain.      . Omega-3 Fatty Acids (FISH OIL) 1000 MG CAPS Take 1 capsule by mouth as needed.      . simvastatin (ZOCOR) 10 MG tablet Take 10 mg by mouth at bedtime.       No current facility-administered medications for this visit.    Past Medical History  Diagnosis Date  . Hypothyroidism   . Breast cancer   . Hyperlipidemia   . GERD (gastroesophageal reflux disease)   . Coronary atherosclerosis of native coronary artery     DES OM 3/12    Social History [name] Fry reports that she has never smoked. She has never used smokeless tobacco. [name] Fry  reports that she does not drink alcohol.  Review of Systems Negative except as outlined.  Physical Examination Filed Vitals:   09/19/12 1125  BP: 147/86  Pulse: 76   Filed Weights   09/19/12 1125  Weight: 202 lb (91.627 kg)   Comfortable at rest. HEENT: Conjunctiva and lids normal, oropharynx with moist mucosa.  Neck: Supple, no elevated JVP or bruits.  Lungs: Clear to auscultation, nonlabored.  Cardiac: Regular rate and rhythm, no S3.  Abdomen: Soft, nontender, bowel sounds present.  Skin: Warm and dry.  Extremities: No pitting edema.    Problem List and Plan   Coronary atherosclerosis of native coronary artery Symptomatically stable on medical therapy, ECG reviewed. Should have an acceptable perioperative cardiac risk for upcoming eye surgery, and reasonable to hold Plavix for the procedure. We will in fact plan not to resume Plavix afterward, she should go on a full dose aspirin daily at that time. Otherwise continue observation.  Hyperlipidemia Will hold Zocor for a few weeks to see if her leg discomfort improves. If so resume Zocor to see if symptoms return. If this is the case, we can discuss considering a different treatment option.    Jonelle Sidle, M.D., F.A.C.C.

## 2012-09-27 ENCOUNTER — Encounter (INDEPENDENT_AMBULATORY_CARE_PROVIDER_SITE_OTHER): Payer: Medicare Other | Admitting: Ophthalmology

## 2012-09-28 ENCOUNTER — Encounter (HOSPITAL_COMMUNITY): Payer: Self-pay | Admitting: *Deleted

## 2012-09-28 NOTE — Progress Notes (Signed)
Pt denies SOB, chest pain. Pt states that Dr. Diona Browner is her cardiologist. Pt denies having a chest x ray, stress test, and echo but states that she had a cardiac cath in 2012.

## 2012-09-29 ENCOUNTER — Ambulatory Visit (HOSPITAL_COMMUNITY): Payer: Medicare Other

## 2012-09-29 ENCOUNTER — Ambulatory Visit (HOSPITAL_COMMUNITY)
Admission: RE | Admit: 2012-09-29 | Discharge: 2012-09-29 | Disposition: A | Discharge status: Home / Self Care | Payer: Medicare Other | Source: Ambulatory Visit | Attending: Ophthalmology | Admitting: Ophthalmology

## 2012-09-29 ENCOUNTER — Ambulatory Visit (HOSPITAL_COMMUNITY): Payer: Medicare Other | Admitting: Anesthesiology

## 2012-09-29 ENCOUNTER — Encounter (HOSPITAL_COMMUNITY)
Admission: RE | Disposition: A | Discharge status: Home / Self Care | Payer: Self-pay | Source: Ambulatory Visit | Attending: Ophthalmology

## 2012-09-29 ENCOUNTER — Encounter (HOSPITAL_COMMUNITY): Payer: Self-pay | Admitting: *Deleted

## 2012-09-29 ENCOUNTER — Encounter (HOSPITAL_COMMUNITY): Payer: Self-pay | Admitting: Anesthesiology

## 2012-09-29 ENCOUNTER — Encounter (INDEPENDENT_AMBULATORY_CARE_PROVIDER_SITE_OTHER): Payer: Medicare Other | Admitting: Ophthalmology

## 2012-09-29 DIAGNOSIS — H35379 Puckering of macula, unspecified eye: Secondary | ICD-10-CM | POA: Insufficient documentation

## 2012-09-29 DIAGNOSIS — I2510 Atherosclerotic heart disease of native coronary artery without angina pectoris: Secondary | ICD-10-CM | POA: Insufficient documentation

## 2012-09-29 DIAGNOSIS — H43819 Vitreous degeneration, unspecified eye: Secondary | ICD-10-CM

## 2012-09-29 DIAGNOSIS — H3530 Unspecified macular degeneration: Secondary | ICD-10-CM

## 2012-09-29 DIAGNOSIS — H35039 Hypertensive retinopathy, unspecified eye: Secondary | ICD-10-CM

## 2012-09-29 DIAGNOSIS — I10 Essential (primary) hypertension: Secondary | ICD-10-CM

## 2012-09-29 HISTORY — PX: 25 GAUGE PARS PLANA VITRECTOMY WITH 20 GAUGE MVR PORT: SHX6041

## 2012-09-29 HISTORY — PX: MEMBRANE PEEL: SHX5967

## 2012-09-29 HISTORY — PX: LASER PHOTO ABLATION: SHX5942

## 2012-09-29 HISTORY — DX: Personal history of other diseases of the digestive system: Z87.19

## 2012-09-29 HISTORY — DX: Unspecified osteoarthritis, unspecified site: M19.90

## 2012-09-29 HISTORY — DX: Unspecified cataract: H26.9

## 2012-09-29 HISTORY — PX: GAS/FLUID EXCHANGE: SHX5334

## 2012-09-29 LAB — BASIC METABOLIC PANEL
BUN: 18 mg/dL (ref 6–23)
CO2: 25 mEq/L (ref 19–32)
Glucose, Bld: 104 mg/dL — ABNORMAL HIGH (ref 70–99)
Potassium: 4.4 mEq/L (ref 3.5–5.1)
Sodium: 139 mEq/L (ref 135–145)

## 2012-09-29 LAB — CBC
HCT: 38.2 % (ref 36.0–46.0)
Hemoglobin: 13 g/dL (ref 12.0–15.0)
MCH: 28.5 pg (ref 26.0–34.0)
MCV: 83.8 fL (ref 78.0–100.0)
RBC: 4.56 MIL/uL (ref 3.87–5.11)

## 2012-09-29 SURGERY — 25 GAUGE PARS PLANA VITRECTOMY WITH 20 GAUGE MVR PORT
Anesthesia: General | Site: Eye | Laterality: Right

## 2012-09-29 MED ORDER — CEFAZOLIN SODIUM-DEXTROSE 2-3 GM-% IV SOLR
INTRAVENOUS | Status: AC
  Filled 2012-09-29: qty 50

## 2012-09-29 MED ORDER — GLYCOPYRROLATE 0.2 MG/ML IJ SOLN
INTRAMUSCULAR | Status: DC | PRN
  Administered 2012-09-29: 0.6 mg via INTRAVENOUS

## 2012-09-29 MED ORDER — HYPROMELLOSE (GONIOSCOPIC) 2.5 % OP SOLN
OPHTHALMIC | Status: AC
  Filled 2012-09-29: qty 15

## 2012-09-29 MED ORDER — NEOSTIGMINE METHYLSULFATE 1 MG/ML IJ SOLN
INTRAMUSCULAR | Status: DC | PRN
  Administered 2012-09-29: 4 mg via INTRAVENOUS

## 2012-09-29 MED ORDER — SODIUM CHLORIDE 0.9 % IV SOLN
INTRAVENOUS | Status: DC
  Administered 2012-09-29 (×2): via INTRAVENOUS

## 2012-09-29 MED ORDER — FENTANYL CITRATE 0.05 MG/ML IJ SOLN
INTRAMUSCULAR | Status: DC | PRN
  Administered 2012-09-29: 50 ug via INTRAVENOUS

## 2012-09-29 MED ORDER — GATIFLOXACIN 0.5 % OP SOLN
OPHTHALMIC | Status: AC
  Administered 2012-09-29: 1 [drp] via OPHTHALMIC
  Filled 2012-09-29: qty 2.5

## 2012-09-29 MED ORDER — OXYCODONE HCL 5 MG/5ML PO SOLN
5.0000 mg | Freq: Once | ORAL | Status: AC | PRN
  Administered 2012-09-29: 5 mg via ORAL

## 2012-09-29 MED ORDER — SODIUM CHLORIDE 0.9 % IJ SOLN
INTRAMUSCULAR | Status: DC | PRN
  Administered 2012-09-29: 15:00:00

## 2012-09-29 MED ORDER — ROCURONIUM BROMIDE 100 MG/10ML IV SOLN
INTRAVENOUS | Status: DC | PRN
  Administered 2012-09-29: 10 mg via INTRAVENOUS
  Administered 2012-09-29: 30 mg via INTRAVENOUS

## 2012-09-29 MED ORDER — ATROPINE SULFATE 1 % OP SOLN
OPHTHALMIC | Status: AC
  Filled 2012-09-29: qty 2

## 2012-09-29 MED ORDER — BACITRACIN-POLYMYXIN B 500-10000 UNIT/GM OP OINT
TOPICAL_OINTMENT | OPHTHALMIC | Status: DC | PRN
  Administered 2012-09-29: 1 via OPHTHALMIC

## 2012-09-29 MED ORDER — BSS IO SOLN
INTRAOCULAR | Status: AC
  Filled 2012-09-29: qty 15

## 2012-09-29 MED ORDER — TROPICAMIDE 1 % OP SOLN
1.0000 [drp] | OPHTHALMIC | Status: AC | PRN
  Administered 2012-09-29 (×2): 1 [drp] via OPHTHALMIC

## 2012-09-29 MED ORDER — LIDOCAINE HCL (CARDIAC) 20 MG/ML IV SOLN
INTRAVENOUS | Status: DC | PRN
  Administered 2012-09-29: 80 mg via INTRAVENOUS

## 2012-09-29 MED ORDER — HYALURONIDASE HUMAN 150 UNIT/ML IJ SOLN
INTRAMUSCULAR | Status: AC
  Filled 2012-09-29: qty 1

## 2012-09-29 MED ORDER — OXYCODONE HCL 5 MG/5ML PO SOLN
ORAL | Status: AC
  Filled 2012-09-29: qty 5

## 2012-09-29 MED ORDER — EPINEPHRINE HCL 1 MG/ML IJ SOLN
INTRAOCULAR | Status: DC | PRN
  Administered 2012-09-29: 14:00:00

## 2012-09-29 MED ORDER — BUPIVACAINE HCL (PF) 0.75 % IJ SOLN
INTRAMUSCULAR | Status: AC
  Filled 2012-09-29: qty 10

## 2012-09-29 MED ORDER — PHENYLEPHRINE HCL 2.5 % OP SOLN
1.0000 [drp] | OPHTHALMIC | Status: AC | PRN
  Administered 2012-09-29 (×2): 1 [drp] via OPHTHALMIC

## 2012-09-29 MED ORDER — TRIAMCINOLONE ACETONIDE 40 MG/ML IJ SUSP
INTRAMUSCULAR | Status: AC
  Filled 2012-09-29: qty 5

## 2012-09-29 MED ORDER — MINERAL OIL LIGHT 100 % EX OIL
TOPICAL_OIL | CUTANEOUS | Status: AC
  Filled 2012-09-29: qty 25

## 2012-09-29 MED ORDER — PROPOFOL 10 MG/ML IV BOLUS
INTRAVENOUS | Status: DC | PRN
  Administered 2012-09-29: 150 mg via INTRAVENOUS

## 2012-09-29 MED ORDER — SODIUM HYALURONATE 10 MG/ML IO SOLN
INTRAOCULAR | Status: AC
  Filled 2012-09-29: qty 0.85

## 2012-09-29 MED ORDER — CEFAZOLIN SODIUM-DEXTROSE 2-3 GM-% IV SOLR
2.0000 g | INTRAVENOUS | Status: AC
  Administered 2012-09-29: 2 g via INTRAVENOUS

## 2012-09-29 MED ORDER — ONDANSETRON HCL 4 MG/2ML IJ SOLN
INTRAMUSCULAR | Status: DC | PRN
  Administered 2012-09-29: 4 mg via INTRAVENOUS

## 2012-09-29 MED ORDER — OXYCODONE HCL 5 MG PO TABS: 5.0000 mg | ORAL_TABLET | Freq: Once | ORAL | Status: AC | PRN

## 2012-09-29 MED ORDER — CYCLOPENTOLATE HCL 1 % OP SOLN
1.0000 [drp] | OPHTHALMIC | Status: AC | PRN
  Administered 2012-09-29 (×2): 1 [drp] via OPHTHALMIC

## 2012-09-29 MED ORDER — FENTANYL CITRATE 0.05 MG/ML IJ SOLN
25.0000 ug | INTRAMUSCULAR | Status: DC | PRN
  Administered 2012-09-29 (×2): 25 ug via INTRAVENOUS

## 2012-09-29 MED ORDER — FENTANYL CITRATE 0.05 MG/ML IJ SOLN
INTRAMUSCULAR | Status: AC
  Administered 2012-09-29: 25 ug via INTRAVENOUS
  Filled 2012-09-29: qty 2

## 2012-09-29 MED ORDER — ONDANSETRON HCL 4 MG/2ML IJ SOLN: 4.0000 mg | Freq: Four times a day (QID) | INTRAMUSCULAR | Status: DC | PRN

## 2012-09-29 MED ORDER — PHENYLEPHRINE HCL 2.5 % OP SOLN
OPHTHALMIC | Status: AC
  Administered 2012-09-29: 1 [drp] via OPHTHALMIC
  Filled 2012-09-29: qty 2

## 2012-09-29 MED ORDER — CYCLOPENTOLATE HCL 1 % OP SOLN
OPHTHALMIC | Status: AC
  Administered 2012-09-29: 1 [drp] via OPHTHALMIC
  Filled 2012-09-29: qty 2

## 2012-09-29 MED ORDER — SODIUM HYALURONATE 10 MG/ML IO SOLN
INTRAOCULAR | Status: DC | PRN
  Administered 2012-09-29: 0.85 mL via INTRAOCULAR

## 2012-09-29 MED ORDER — 0.9 % SODIUM CHLORIDE (POUR BTL) OPTIME
TOPICAL | Status: DC | PRN
  Administered 2012-09-29: 1000 mL

## 2012-09-29 MED ORDER — DEXAMETHASONE SODIUM PHOSPHATE 10 MG/ML IJ SOLN
INTRAMUSCULAR | Status: DC | PRN
  Administered 2012-09-29: 10 mg

## 2012-09-29 MED ORDER — BACITRACIN-POLYMYXIN B 500-10000 UNIT/GM OP OINT
TOPICAL_OINTMENT | OPHTHALMIC | Status: AC
  Filled 2012-09-29: qty 3.5

## 2012-09-29 MED ORDER — GENTAMICIN SULFATE 40 MG/ML IJ SOLN
INTRAMUSCULAR | Status: AC
  Filled 2012-09-29: qty 2

## 2012-09-29 MED ORDER — ACETAZOLAMIDE SODIUM 500 MG IJ SOLR
INTRAMUSCULAR | Status: AC
  Filled 2012-09-29: qty 500

## 2012-09-29 MED ORDER — HYPROMELLOSE (GONIOSCOPIC) 2.5 % OP SOLN
OPHTHALMIC | Status: DC | PRN
  Administered 2012-09-29: 2 [drp] via OPHTHALMIC

## 2012-09-29 MED ORDER — TROPICAMIDE 1 % OP SOLN
OPHTHALMIC | Status: AC
  Administered 2012-09-29: 1 [drp] via OPHTHALMIC
  Filled 2012-09-29: qty 3

## 2012-09-29 MED ORDER — LIDOCAINE HCL 2 % IJ SOLN
INTRAMUSCULAR | Status: AC
  Filled 2012-09-29: qty 20

## 2012-09-29 MED ORDER — GATIFLOXACIN 0.5 % OP SOLN
1.0000 [drp] | OPHTHALMIC | Status: AC | PRN
  Administered 2012-09-29 (×2): 1 [drp] via OPHTHALMIC

## 2012-09-29 MED ORDER — POLYMYXIN B SULFATE 500000 UNITS IJ SOLR
INTRAMUSCULAR | Status: AC
  Filled 2012-09-29: qty 1

## 2012-09-29 MED ORDER — BUPIVACAINE HCL (PF) 0.75 % IJ SOLN
INTRAMUSCULAR | Status: DC | PRN
  Administered 2012-09-29: 10 mL

## 2012-09-29 MED ORDER — MIDAZOLAM HCL 5 MG/5ML IJ SOLN
INTRAMUSCULAR | Status: DC | PRN
  Administered 2012-09-29: 2 mg via INTRAVENOUS

## 2012-09-29 MED ORDER — EPINEPHRINE HCL 1 MG/ML IJ SOLN
INTRAMUSCULAR | Status: AC
  Filled 2012-09-29: qty 1

## 2012-09-29 MED ORDER — DEXAMETHASONE SODIUM PHOSPHATE 10 MG/ML IJ SOLN
INTRAMUSCULAR | Status: AC
  Filled 2012-09-29: qty 1

## 2012-09-29 SURGICAL SUPPLY — 68 items
APL SRG 3 HI ABS STRL LF PLS (MISCELLANEOUS)
APPLICATOR DR MATTHEWS STRL (MISCELLANEOUS) IMPLANT
BALL CTTN LRG ABS STRL LF (GAUZE/BANDAGES/DRESSINGS) ×3
BLADE MVR KNIFE 19G (BLADE) IMPLANT
BLADE MVR KNIFE 20G (BLADE) IMPLANT
CANNULA DUAL BORE 23G (CANNULA) IMPLANT
CANNULA FLEX TIP 25G (CANNULA) ×1 IMPLANT
CANNULA VLV SOFT TIP 25G (OPHTHALMIC) ×1 IMPLANT
CANNULA VLV SOFT TIP 25GA (OPHTHALMIC) ×4 IMPLANT
CLOTH BEACON ORANGE TIMEOUT ST (SAFETY) ×2 IMPLANT
CORDS BIPOLAR (ELECTRODE) ×1 IMPLANT
COTTONBALL LRG STERILE PKG (GAUZE/BANDAGES/DRESSINGS) ×6 IMPLANT
COVER MAYO STAND STRL (DRAPES) ×2 IMPLANT
DRAPE INCISE 51X51 W/FILM STRL (DRAPES) ×1 IMPLANT
DRAPE OPHTHALMIC 77X100 STRL (CUSTOM PROCEDURE TRAY) ×2 IMPLANT
FORCEPS ECKARDT ILM 25G SERR (OPHTHALMIC RELATED) IMPLANT
FORCEPS GRIESHABER ILM 25G A (INSTRUMENTS) ×1 IMPLANT
GLOVE SS BIOGEL STRL SZ 6.5 (GLOVE) ×1 IMPLANT
GLOVE SS BIOGEL STRL SZ 7 (GLOVE) ×1 IMPLANT
GLOVE SUPERSENSE BIOGEL SZ 6.5 (GLOVE) ×2
GLOVE SUPERSENSE BIOGEL SZ 7 (GLOVE) ×1
GLOVE SURG 8.5 LATEX PF (GLOVE) ×2 IMPLANT
GOWN STRL NON-REIN LRG LVL3 (GOWN DISPOSABLE) ×6 IMPLANT
HANDLE PNEUMATIC FOR CONSTEL (OPHTHALMIC) ×1 IMPLANT
KIT BASIN OR (CUSTOM PROCEDURE TRAY) ×2 IMPLANT
KIT ROOM TURNOVER OR (KITS) ×2 IMPLANT
KNIFE CRESCENT 2.5 55 ANG (BLADE) IMPLANT
MASK EYE SHIELD (GAUZE/BANDAGES/DRESSINGS) ×1 IMPLANT
MICROPICK 25G (MISCELLANEOUS)
NDL 18GX1X1/2 (RX/OR ONLY) (NEEDLE) ×1 IMPLANT
NDL 25GX 5/8IN NON SAFETY (NEEDLE) ×1 IMPLANT
NDL FILTER BLUNT 18X1 1/2 (NEEDLE) ×1 IMPLANT
NDL HYPO 30X.5 LL (NEEDLE) ×1 IMPLANT
NEEDLE 18GX1X1/2 (RX/OR ONLY) (NEEDLE) ×2 IMPLANT
NEEDLE 25GX 5/8IN NON SAFETY (NEEDLE) ×2 IMPLANT
NEEDLE 27GAX1X1/2 (NEEDLE) IMPLANT
NEEDLE FILTER BLUNT 18X 1/2SAF (NEEDLE) ×1
NEEDLE FILTER BLUNT 18X1 1/2 (NEEDLE) ×1 IMPLANT
NEEDLE HYPO 30X.5 LL (NEEDLE) ×2 IMPLANT
NS IRRIG 1000ML POUR BTL (IV SOLUTION) ×2 IMPLANT
PACK VITRECTOMY CUSTOM (CUSTOM PROCEDURE TRAY) ×2 IMPLANT
PAD ARMBOARD 7.5X6 YLW CONV (MISCELLANEOUS) ×4 IMPLANT
PAD EYE OVAL STERILE LF (GAUZE/BANDAGES/DRESSINGS) ×1 IMPLANT
PAK PIK VITRECTOMY CVS 25GA (OPHTHALMIC) ×2 IMPLANT
PAK VITRECTOMY PIK 25 GA (OPHTHALMIC RELATED) ×1 IMPLANT
PENCIL BIPOLAR 25GA STR DISP (OPHTHALMIC RELATED) IMPLANT
PIC ILLUMINATED 25G (OPHTHALMIC) ×2
PICK MICROPICK 25G (MISCELLANEOUS) IMPLANT
PIK ILLUMINATED 25G (OPHTHALMIC) ×1 IMPLANT
PROBE DIRECTIONAL LASER (MISCELLANEOUS) IMPLANT
PROBE LASER ILLUM FLEX CVD 25G (OPHTHALMIC) ×2 IMPLANT
REPL STRA BRUSH NDL (NEEDLE) IMPLANT
REPL STRA BRUSH NEEDLE (NEEDLE) IMPLANT
RESERVOIR BACK FLUSH (MISCELLANEOUS) IMPLANT
ROLLS DENTAL (MISCELLANEOUS) ×4 IMPLANT
SCRAPER DIAMOND DUST MEMBRANE (MISCELLANEOUS) ×1 IMPLANT
SPONGE SURGIFOAM ABS GEL 12-7 (HEMOSTASIS) ×2 IMPLANT
SUT CHROMIC 7 0 TG140 8 (SUTURE) IMPLANT
SUT ETHILON 10 0 CS140 6 (SUTURE) IMPLANT
SUT ETHILON 9 0 TG140 8 (SUTURE) ×1 IMPLANT
SYR 20CC LL (SYRINGE) ×2 IMPLANT
SYR BULB 3OZ (MISCELLANEOUS) ×2 IMPLANT
SYR TB 1ML LUER SLIP (SYRINGE) ×2 IMPLANT
TAPE SURG TRANSPORE 1 IN (GAUZE/BANDAGES/DRESSINGS) IMPLANT
TAPE SURGICAL TRANSPORE 1 IN (GAUZE/BANDAGES/DRESSINGS) ×1
TOWEL OR 17X24 6PK STRL BLUE (TOWEL DISPOSABLE) ×6 IMPLANT
WATER STERILE IRR 1000ML POUR (IV SOLUTION) ×2 IMPLANT
WIPE INSTRUMENT VISIWIPE 73X73 (MISCELLANEOUS) ×2 IMPLANT

## 2012-09-29 NOTE — H&P (Signed)
[name] Fry is an 67 y.o. female.   Chief Complaint: poor vision right eye HPI: progressive loss of vision right eye  Past Medical History  Diagnosis Date  . Hypothyroidism   . Breast cancer   . Hyperlipidemia   . GERD (gastroesophageal reflux disease)   . Coronary atherosclerosis of native coronary artery     DES OM 3/12  . Hypertension   . H/O hiatal hernia   . Arthritis   . Cataracts, bilateral     Hx: of    Past Surgical History  Procedure Laterality Date  . Mastectomy       left, 1997; S/P chemotherapy/radiation  . Coronary angioplasty with stent placement    . Cholecystectomy    . Appendectomy    . Tubal ligation      Family History  Problem Relation Age of Onset  . Heart attack Father 88   Social History:  reports that she has never smoked. She has never used smokeless tobacco. She reports that she does not drink alcohol or use illicit drugs.  Allergies: No Known Allergies  Medications Prior to Admission  Medication Sig Dispense Refill  . cholecalciferol (VITAMIN D) 1000 UNITS tablet Take 1,000 Units by mouth every 7 (seven) days.       Marland Kitchen levothyroxine (SYNTHROID, LEVOTHROID) 112 MCG tablet Take 112 mcg by mouth daily.       . metoprolol succinate (TOPROL-XL) 25 MG 24 hr tablet Take 25 mg by mouth daily.      . Multiple Vitamins-Minerals (OCUVITE PRESERVISION PO) Take 2 tablets by mouth daily.      . Omega-3 Fatty Acids (FISH OIL) 1000 MG CAPS Take 1 capsule by mouth as needed.      . simvastatin (ZOCOR) 10 MG tablet Take 10 mg by mouth at bedtime.      Marland Kitchen aspirin EC 81 MG tablet Take 81 mg by mouth as needed.       . clopidogrel (PLAVIX) 75 MG tablet TAKE 1 TABLET BY MOUTH EVERY DAY  30 tablet  0  . nitroGLYCERIN (NITROSTAT) 0.4 MG SL tablet Place 0.4 mg under the tongue every 5 (five) minutes as needed for chest pain.        Review of systems otherwise negative  Blood pressure 152/85, pulse 76, temperature 97.7 F (36.5 C), temperature source Oral, resp.  rate 18, height 5\' 3"  (1.6 m), weight 202 lb (91.627 kg), last menstrual period 09/28/2012, SpO2 98.00%.  Physical exam: Mental status: oriented x3. Eyes: See eye exam associated with this date of surgery in media tab.  Scanned in by scanning center Ears, Nose, Throat: within normal limits Neck: Within Normal limits General: within normal limits Chest: Within normal limits Breast: deferred Heart: Within normal limits Abdomen: Within normal limits GU: deferred Extremities: within normal limits Skin: within normal limits  Assessment/Plan Pre retinal fibrosis right eye Plan: To [facility] for Pars plana vitrectomy, laser, gas  Right eye  Sherrie George 09/29/2012, 9:26 AM

## 2012-09-29 NOTE — H&P (Signed)
I examined the patient today and there is no change in the medical status 

## 2012-09-29 NOTE — Anesthesia Postprocedure Evaluation (Signed)
Anesthesia Post Note  Patient: [name] Fry  Procedure(s) Performed: Procedure(s) (LRB): 25 GAUGE PARS PLANA VITRECTOMY WITH 20 GAUGE MVR PORT (Right) LASER PHOTO ABLATION (Right) MEMBRANE PEEL (Right) GAS/FLUID EXCHANGE (Right)  Anesthesia type: general  Patient location: PACU  Post pain: Pain level controlled  Post assessment: Patient's Cardiovascular Status Stable  Last Vitals:  Filed Vitals:   09/29/12 1545  BP: 140/66  Pulse: 73  Temp:   Resp: 19    Post vital signs: Reviewed and stable  Level of consciousness: sedated  Complications: No apparent anesthesia complications

## 2012-09-29 NOTE — Transfer of Care (Signed)
Immediate Anesthesia Transfer of Care Note  Patient: [name] Fry  Procedure(s) Performed: Procedure(s) with comments: 25 GAUGE PARS PLANA VITRECTOMY WITH 20 GAUGE MVR PORT (Right) LASER PHOTO ABLATION (Right) - Headscope laser MEMBRANE PEEL (Right) GAS/FLUID EXCHANGE (Right)  Patient Location: PACU  Anesthesia Type:General  Level of Consciousness: awake, alert  and oriented  Airway & Oxygen Therapy: Patient Spontanous Breathing and Patient connected to nasal cannula oxygen  Post-op Assessment: Report given to PACU RN, Post -op Vital signs reviewed and stable and Patient moving all extremities X 4  Post vital signs: Reviewed and stable  Complications: No apparent anesthesia complications

## 2012-09-29 NOTE — Progress Notes (Signed)
Rec'd report from [employer]

## 2012-09-29 NOTE — Preoperative (Signed)
Beta Blockers   Reason not to administer Beta Blockers:Not Applicable  Pt took am toprol.

## 2012-09-29 NOTE — Anesthesia Preprocedure Evaluation (Signed)
Anesthesia Evaluation  Patient identified by MRN, date of birth, ID band Patient awake    Reviewed: Allergy & Precautions, H&P , NPO status , Patient's Chart, lab work & pertinent test results  Airway Mallampati: II  Neck ROM: full    Dental   Pulmonary          Cardiovascular hypertension, + CAD and + Cardiac Stents     Neuro/Psych    GI/Hepatic hiatal hernia, GERD-  ,  Endo/Other  Hypothyroidism obese  Renal/GU      Musculoskeletal  (+) Arthritis -,   Abdominal   Peds  Hematology   Anesthesia Other Findings   Reproductive/Obstetrics                           Anesthesia Physical Anesthesia Plan  ASA: III  Anesthesia Plan: General   Post-op Pain Management:    Induction: Intravenous  Airway Management Planned: Oral ETT  Additional Equipment:   Intra-op Plan:   Post-operative Plan: Extubation in OR  Informed Consent: I have reviewed the patients History and Physical, chart, labs and discussed the procedure including the risks, benefits and alternatives for the proposed anesthesia with the patient or authorized representative who has indicated his/her understanding and acceptance.     Plan Discussed with: CRNA, Anesthesiologist and Surgeon  Anesthesia Plan Comments:         Anesthesia Quick Evaluation

## 2012-09-29 NOTE — Brief Op Note (Signed)
Brief Operative note   Preoperative diagnosis:  Pre-Op Diagnosis Codes:    * Macular puckering of retina, right [362.56] Postoperative diagnosis  Post-Op Diagnosis Codes:    * Macular puckering of retina, right [362.56]  Procedure:  Pars plana vitrectomy, laser treatment, membrane peel, gas injection right eye  Surgeon:  Sherrie George, MD...  Assistant:  Rosalie Doctor SA  Charyl Dancer RN  Anesthesia: General  Specimen: none  Estimated blood loss:  1cc  Complications: none  Patient sent to PACU in good condition  Composed by Sherrie George MD  Dictation number: [phone]

## 2012-09-29 NOTE — Progress Notes (Signed)
Patient assisted to bathroom offered warm blankets

## 2012-09-30 ENCOUNTER — Inpatient Hospital Stay (INDEPENDENT_AMBULATORY_CARE_PROVIDER_SITE_OTHER): Payer: Medicare Other | Admitting: Ophthalmology

## 2012-09-30 DIAGNOSIS — H35379 Puckering of macula, unspecified eye: Secondary | ICD-10-CM

## 2012-09-30 NOTE — Op Note (Signed)
NAME:  Manago, Bronda                   ACCOUNT NO.:  [id number]  MEDICAL RECORD NO.:  [id number]  LOCATION:  [facility]                         FACILITY:  [facility]  PHYSICIAN:  Ariela Mochizuki D. Ashley Royalty, M.D. DATE OF BIRTH:  May 29, 1945  DATE OF PROCEDURE:  09/29/2012 DATE OF DISCHARGE:  09/29/2012                              OPERATIVE REPORT   ADMISSION DIAGNOSIS:  Preretinal fibrosis, right eye.  PROCEDURES:  Pars plana vitrectomy, retinal photocoagulation, membrane peel, gas fluid exchange, right eye.  SURGEON:  Beulah Gandy. Ashley Royalty, M.D.  ASSISTANT:  Rosalie Doctor, SA  ANESTHESIA:  General.  DETAILS:  Usual prep and drape, 25-gauge trocars placed at 8, 10, and 2 o'clock, infusion at 8 o'clock.  Contact lens ring was anchored into place at 6 and 12 o'clock.  The Provisc was placed on the corneal surface in the Y and the magnifying contact lens was placed.  Pars plana vitrectomy was begun just behind the cataractous lens.  The vitrectomy was carried posteriorly down to the macular surface where her white glistening membrane was seen.  The diamond-dusted scraper was used to engage the membrane and create an edge.  The ILM forceps were used to grasp the membrane from different angles and peeled it across the macula.  The membrane came forth in a 1 large sheet.  The diamond-dusted scraper was used again to smooth the retina in the perimacular region. The vitrectomy was carried out into the far periphery.  The contact lens was removed, and the BIOM viewing system was moved into place.  The vitrectomy was carried out in the mid periphery and far periphery.  Once all the vitreous was removed.  The indirect ophthalmoscope laser was moved into place, 887 burns were placed around the retinal periphery. The power was 400 mW 1000 microns each at 0.1 seconds each.  A total gas fluid exchange was carried out with a sterile room air.  The instruments removed from the eye.  The trocars were removed from the eye.   Polymyxin and gentamicin were irrigated into tenon space.  The wounds were tested and found to be secure.  Decadron 10 mg was injected into the lower subconjunctival space.  Marcaine was injected around the globe for postop pain.  Closing pressure was 10 with a [place] tonometer. Polysporin ophthalmic ointment, a patch and shield were placed.  The patient was awakened and taken to recovery in satisfactory condition. Operative time 1 hour.     Beulah Gandy. Ashley Royalty, M.D.     JDM/MEDQ  D:  09/29/2012  T:  09/30/2012  Job:  [id number]

## 2012-09-30 NOTE — Discharge Summary (Signed)
Discharge summary not needed on [facility] patients per medical records. 

## 2012-10-04 ENCOUNTER — Encounter (HOSPITAL_COMMUNITY): Payer: Self-pay | Admitting: Ophthalmology

## 2012-10-05 ENCOUNTER — Inpatient Hospital Stay (INDEPENDENT_AMBULATORY_CARE_PROVIDER_SITE_OTHER): Payer: Medicare Other | Admitting: Ophthalmology

## 2012-10-05 ENCOUNTER — Encounter (INDEPENDENT_AMBULATORY_CARE_PROVIDER_SITE_OTHER): Payer: Medicare Other | Admitting: Ophthalmology

## 2012-10-05 DIAGNOSIS — H35379 Puckering of macula, unspecified eye: Secondary | ICD-10-CM

## 2012-10-28 ENCOUNTER — Encounter (INDEPENDENT_AMBULATORY_CARE_PROVIDER_SITE_OTHER): Payer: Medicare Other | Admitting: Ophthalmology

## 2012-10-28 DIAGNOSIS — H35379 Puckering of macula, unspecified eye: Secondary | ICD-10-CM

## 2012-11-18 ENCOUNTER — Other Ambulatory Visit: Payer: Self-pay | Admitting: Cardiology

## 2012-11-28 ENCOUNTER — Encounter (INDEPENDENT_AMBULATORY_CARE_PROVIDER_SITE_OTHER): Payer: Medicare Other | Admitting: Ophthalmology

## 2012-11-28 DIAGNOSIS — H35379 Puckering of macula, unspecified eye: Secondary | ICD-10-CM

## 2013-01-30 ENCOUNTER — Encounter (INDEPENDENT_AMBULATORY_CARE_PROVIDER_SITE_OTHER): Payer: Medicare Other | Admitting: Ophthalmology

## 2013-01-30 DIAGNOSIS — I10 Essential (primary) hypertension: Secondary | ICD-10-CM

## 2013-01-30 DIAGNOSIS — H43819 Vitreous degeneration, unspecified eye: Secondary | ICD-10-CM

## 2013-01-30 DIAGNOSIS — H3530 Unspecified macular degeneration: Secondary | ICD-10-CM

## 2013-01-30 DIAGNOSIS — H2510 Age-related nuclear cataract, unspecified eye: Secondary | ICD-10-CM

## 2013-01-30 DIAGNOSIS — H35039 Hypertensive retinopathy, unspecified eye: Secondary | ICD-10-CM

## 2013-01-30 DIAGNOSIS — H35379 Puckering of macula, unspecified eye: Secondary | ICD-10-CM

## 2013-04-10 ENCOUNTER — Encounter: Payer: Self-pay | Admitting: Cardiology

## 2013-04-10 NOTE — Progress Notes (Signed)
    Clinical Summary Ms. Axon is a 68 y.o.female last seen in July 2014. Since then Plavix has been stopped and she was taken off Zocor to see if it improved her leg discomfort. She tells me that her legs feel much better since stopping Zocor, she has had prior statin intolerance and does not want to go on a different statin medication.  Her mother has passed away since the last visit - she had been declining with Alzheimer's dementia  We talked about this some today. She seems to be appropriately grieving.  She does admit that she has not been exercising during the cold months her weight has increased. We discussed some options today.  No Known Allergies  Current Outpatient Prescriptions  Medication Sig Dispense Refill  . aspirin 325 MG tablet Take 325 mg by mouth daily.      . cholecalciferol (VITAMIN D) 1000 UNITS tablet Take 1,000 Units by mouth every 7 (seven) days.       . Coenzyme Q10 (CO Q 10) 10 MG CAPS Take 2 tablets by mouth daily.      Marland Kitchen levothyroxine (SYNTHROID, LEVOTHROID) 112 MCG tablet Take 112 mcg by mouth daily.       . metoprolol succinate (TOPROL-XL) 25 MG 24 hr tablet Take 25 mg by mouth daily.      . Multiple Vitamins-Minerals (OCUVITE PRESERVISION PO) Take 2 tablets by mouth daily.      . nitroGLYCERIN (NITROSTAT) 0.4 MG SL tablet Place 0.4 mg under the tongue every 5 (five) minutes as needed for chest pain.      . Omega-3 Fatty Acids (FISH OIL) 1000 MG CAPS Take 1 capsule by mouth as needed.      . Turmeric 500 MG CAPS Take 500 mg by mouth daily.       No current facility-administered medications for this visit.    Past Medical History  Diagnosis Date  . Hypothyroidism   . Breast cancer   . Hyperlipidemia   . GERD (gastroesophageal reflux disease)   . Coronary atherosclerosis of native coronary artery     DES OM 3/12  . Hypertension   . H/O hiatal hernia   . Arthritis   . Cataracts, bilateral     Hx: of    Social History Ms. Diehl reports that she has  never smoked. She has never used smokeless tobacco. Ms. Peery reports that she does not drink alcohol.  Review of Systems No palpitations, dizziness, syncope. No claudication. Otherwise as outlined.  Physical Examination Filed Vitals:   04/11/13 1040  BP: 143/85  Pulse: 63   Filed Weights   04/11/13 1040  Weight: 211 lb (95.709 kg)    Comfortable at rest.  HEENT: Conjunctiva and lids normal, oropharynx with moist mucosa.  Neck: Supple, no elevated JVP or bruits.  Lungs: Clear to auscultation, nonlabored.  Cardiac: Regular rate and rhythm, no S3.  Abdomen: Soft, nontender, bowel sounds present.  Skin: Warm and dry.  Extremities: No pitting edema.  Neuropsychiatric: Alert and oriented x3, affect appropriate.    Problem List and Plan   Coronary atherosclerosis of native coronary artery Symptomatically stable status post DES to the obtuse marginal in 2012. She is now on aspirin, Plavix has been discontinued. Continue observation.  Hyperlipidemia Statin intolerance as outlined. She does not work try a different statin preparation. Continues on coenzyme Q Q10 and omega-3 supplements. Followup FLP.    Satira Sark, M.D., F.A.C.C.

## 2013-04-11 ENCOUNTER — Ambulatory Visit (INDEPENDENT_AMBULATORY_CARE_PROVIDER_SITE_OTHER): Payer: Medicare Other | Admitting: Cardiology

## 2013-04-11 ENCOUNTER — Encounter: Payer: Self-pay | Admitting: Cardiology

## 2013-04-11 VITALS — BP 143/85 | HR 63 | Ht 63.5 in | Wt 211.0 lb

## 2013-04-11 DIAGNOSIS — I2510 Atherosclerotic heart disease of native coronary artery without angina pectoris: Secondary | ICD-10-CM

## 2013-04-11 DIAGNOSIS — E039 Hypothyroidism, unspecified: Secondary | ICD-10-CM

## 2013-04-11 DIAGNOSIS — E785 Hyperlipidemia, unspecified: Secondary | ICD-10-CM

## 2013-04-11 NOTE — Patient Instructions (Addendum)
Your physician recommends that you schedule a follow-up appointment in: 6 months. You will receive a reminder letter in the mail in about 4 months reminding you to call and schedule your appointment. If you don't receive this letter, please contact our office. Your physician recommends that you continue on your current medications as directed. Please refer to the Current Medication list given to you today. Your physician recommends that you have a FASTING lipid profile done. TSH WILL ALSO BE DONE PER PATIENT REQUEST AND SENT TO DR. Lyman Speller OFFICE FOR REVIEW.

## 2013-04-11 NOTE — Assessment & Plan Note (Signed)
Statin intolerance as outlined. She does not work try a different statin preparation. Continues on coenzyme Q Q10 and omega-3 supplements. Followup FLP.

## 2013-04-11 NOTE — Addendum Note (Signed)
Addended by: [name] on: 04/11/2013 11:04 AM   Modules accepted: Orders

## 2013-04-11 NOTE — Assessment & Plan Note (Addendum)
Symptomatically stable status post DES to the obtuse marginal in 2012. She is now on aspirin, Plavix has been discontinued. Continue observation.

## 2013-04-14 ENCOUNTER — Telehealth: Payer: Self-pay | Admitting: *Deleted

## 2013-04-14 MED ORDER — EZETIMIBE 10 MG PO TABS: 10.0000 mg | ORAL_TABLET | Freq: Every day | ORAL | Status: DC

## 2013-04-14 MED ORDER — FISH OIL 1000 MG PO CAPS: 1.0000 | ORAL_CAPSULE | Freq: Two times a day (BID) | ORAL | Status: DC

## 2013-04-14 NOTE — Telephone Encounter (Signed)
Notes Recorded by Laurine Blazer, LPN on 5/44/9201 at 00:71 AM Patient notified and verbalized understanding. Will send new rx to [facility]. Also will mail reference info on diet.

## 2013-04-14 NOTE — Telephone Encounter (Signed)
Message copied by Laurine Blazer on Fri Apr 14, 2013 11:46 AM ------      Message from: MCDOWELL, Aloha Gell      Created: Thu Apr 13, 2013 12:41 PM       Reviewed. Please let her know that lipid numbers are quite high, total cholesterol 309, LDL 187. As per our recent visit, she was not interested in trying a different statin. She could consider Zetia 10 mg daily. Triglycerides 356. Would consider increasing her omega-3 supplements to 1000 mg twice daily and work toward a total of 2 tablets twice daily. ------

## 2013-04-21 ENCOUNTER — Telehealth: Payer: Self-pay | Admitting: Cardiology

## 2013-04-21 NOTE — Telephone Encounter (Signed)
Pt called and stated that when she went to pick up Zetia from pharmacy it was going to cost her $217.00. Pt stated that she could not afford medication. I informed pt that I would call pharmacy to see if this medication needed a PA. Called [facility] and spoke with Joellen Jersey. She informed that pt had deductible. Pharmacy said that pt would need to call her insurance to see what her deductible is. I informed pt of this. Can we change medication. Pt states that she can not afford $217 for medication. Pt still not taking anything for cholesterol?

## 2013-04-21 NOTE — Telephone Encounter (Signed)
She has had statin intolerances, and does not want to try any other statin medications. This is why we picked Zetia. She is already on omega-3 supplements and CoQ10. Other option would be to go and be seen in our lipid clinic in [place].

## 2013-04-24 NOTE — Telephone Encounter (Signed)
Pt does not want to go to lipid clinic in [place]. Pt stated that she is going to contact her insurance company and see how long she has to pay the $217 for the medication. She said if its not long she may just pay the $217 until the deductible is meet. I informed pt to inform office of her decision she verbalized understanding.

## 2013-05-03 ENCOUNTER — Encounter (HOSPITAL_COMMUNITY): Payer: Self-pay | Admitting: Pharmacy Technician

## 2013-05-11 ENCOUNTER — Encounter (HOSPITAL_COMMUNITY): Admission: RE | Admit: 2013-05-11 | Payer: Medicare Other | Source: Ambulatory Visit

## 2013-05-15 ENCOUNTER — Encounter (HOSPITAL_COMMUNITY): Payer: Self-pay

## 2013-05-15 ENCOUNTER — Encounter (HOSPITAL_COMMUNITY)
Admission: RE | Admit: 2013-05-15 | Discharge: 2013-05-15 | Disposition: A | Discharge status: Home / Self Care | Payer: Medicare Other | Source: Ambulatory Visit | Attending: Ophthalmology | Admitting: Ophthalmology

## 2013-05-15 LAB — BASIC METABOLIC PANEL
BUN: 17 mg/dL (ref 6–23)
CHLORIDE: 102 meq/L (ref 96–112)
CO2: 27 meq/L (ref 19–32)
Calcium: 9.9 mg/dL (ref 8.4–10.5)
Creatinine, Ser: 0.7 mg/dL (ref 0.50–1.10)
GFR calc Af Amer: >90 mL/min (ref >90)
GFR calc non Af Amer: 87 mL/min — ABNORMAL LOW (ref >90)
Glucose, Bld: 99 mg/dL (ref 70–99)
Potassium: 4.3 mEq/L (ref 3.7–5.3)
Sodium: 141 mEq/L (ref 137–147)

## 2013-05-15 LAB — HEMOGLOBIN AND HEMATOCRIT, BLOOD
HEMATOCRIT: 36.9 % (ref 36.0–46.0)
Hemoglobin: 12.7 g/dL (ref 12.0–15.0)

## 2013-05-15 NOTE — Patient Instructions (Signed)
[name] Fry  05/15/2013   Your procedure is scheduled on:  05/18/13  Report to [facility] at 06:30 AM.  Call this number if you have problems the morning of surgery: [phone]   Remember:   Do not eat food or drink liquids after midnight.   Take these medicines the morning of surgery with A SIP OF WATER: Metoprolol and Levothyroxine.   Do not wear jewelry, make-up or nail polish.  Do not wear lotions, powders, or perfumes. You may wear deodorant.  Do not shave 48 hours prior to surgery. Men may shave face and neck.  Do not bring valuables to the hospital.  [facility] is not responsible for any belongings or valuables.               Contacts, dentures or bridgework may not be worn into surgery.  Leave suitcase in the car. After surgery it may be brought to your room.  For patients admitted to the hospital, discharge time is determined by your treatment team.               Patients discharged the day of surgery will not be allowed to drive home.    Special Instructions: Start using your eye drops prior to surgery as directed by your eye doctor.   Please read over the following fact sheets that you were given: Anesthesia Post-op Instructions and Care and Recovery After Surgery     Cataract Surgery  A cataract is a clouding of the lens of the eye. When a lens becomes cloudy, vision is reduced based on the degree and nature of the clouding. Surgery may be needed to improve vision. Surgery removes the cloudy lens and usually replaces it with a substitute lens (intraocular lens, IOL). LET YOUR EYE DOCTOR KNOW ABOUT:  Allergies to food or medicine.  Medicines taken including herbs, eyedrops, over-the-counter medicines, and creams.  Use of steroids (by mouth or creams).  Previous problems with anesthetics or numbing medicine.  History of bleeding problems or blood clots.  Previous surgery.  Other health problems, including diabetes and kidney problems.  Possibility of  pregnancy, if this applies. RISKS AND COMPLICATIONS  Infection.  Inflammation of the eyeball (endophthalmitis) that can spread to both eyes (sympathetic ophthalmia).  Poor wound healing.  If an IOL is inserted, it can later fall out of proper position. This is very uncommon.  Clouding of the part of your eye that holds an IOL in place. This is called an "after-cataract." These are uncommon, but easily treated. BEFORE THE PROCEDURE  Do not eat or drink anything except small amounts of water for 8 to 12 before your surgery, or as directed by your caregiver.  Unless you are told otherwise, continue any eyedrops you have been prescribed.  Talk to your primary caregiver about all other medicines that you take (both prescription and non-prescription). In some cases, you may need to stop or change medicines near the time of your surgery. This is most important if you are taking blood-thinning medicine.Do not stop medicines unless you are told to do so.  Arrange for someone to drive you to and from the procedure.  Do not put contact lenses in either eye on the day of your surgery. PROCEDURE There is more than one method for safely removing a cataract. Your doctor can explain the differences and help determine which is best for you. Phacoemulsification surgery is the most common form of cataract surgery.  An injection is given  behind the eye or eyedrops are given to make this a painless procedure.  A small cut (incision) is made on the edge of the clear, dome-shaped surface that covers the front of the eye (cornea).  A tiny probe is painlessly inserted into the eye. This device gives off ultrasound waves that soften and break up the cloudy center of the lens. This makes it easier for the cloudy lens to be removed by suction.  An IOL may be implanted.  The normal lens of the eye is covered by a clear capsule. Part of that capsule is intentionally left in the eye to support the IOL.  Your  surgeon may or may not use stitches to close the incision. There are other forms of cataract surgery that require a larger incision and stiches to close the eye. This approach is taken in cases where the doctor feels that the cataract cannot be easily removed using phacoemulsification. AFTER THE PROCEDURE  When an IOL is implanted, it does not need care. It becomes a permanent part of your eye and cannot be seen or felt.  Your doctor will schedule follow-up exams to check on your progress.  Review your other medicines with your doctor to see which can be resumed after surgery.  Use eyedrops or take medicine as prescribed by your doctor. Document Released: 02/19/2011 Document Revised: 05/25/2011 Document Reviewed: 02/19/2011 [facility] Patient Information 2014 [place], [place].    PATIENT INSTRUCTIONS POST-ANESTHESIA  IMMEDIATELY FOLLOWING SURGERY:  Do not drive or operate machinery for the first twenty four hours after surgery.  Do not make any important decisions for twenty four hours after surgery or while taking narcotic pain medications or sedatives.  If you develop intractable nausea and vomiting or a severe headache please notify your doctor immediately.  FOLLOW-UP:  Please make an appointment with your surgeon as instructed. You do not need to follow up with anesthesia unless specifically instructed to do so.  WOUND CARE INSTRUCTIONS (if applicable):  Keep a dry clean dressing on the anesthesia/puncture wound site if there is drainage.  Once the wound has quit draining you may leave it open to air.  Generally you should leave the bandage intact for twenty four hours unless there is drainage.  If the epidural site drains for more than 36-48 hours please call the anesthesia department.  QUESTIONS?:  Please feel free to call your physician or the hospital operator if you have any questions, and they will be happy to assist you.

## 2013-05-17 MED ORDER — LIDOCAINE HCL (PF) 1 % IJ SOLN
INTRAMUSCULAR | Status: AC
  Filled 2013-05-17: qty 2

## 2013-05-17 MED ORDER — CYCLOPENTOLATE-PHENYLEPHRINE OP SOLN OPTIME - NO CHARGE
OPHTHALMIC | Status: AC
  Filled 2013-05-17: qty 2

## 2013-05-17 MED ORDER — NEOMYCIN-POLYMYXIN-DEXAMETH 3.5-10000-0.1 OP SUSP
OPHTHALMIC | Status: AC
  Filled 2013-05-17: qty 5

## 2013-05-17 MED ORDER — PHENYLEPHRINE HCL 2.5 % OP SOLN
OPHTHALMIC | Status: AC
  Filled 2013-05-17: qty 15

## 2013-05-17 MED ORDER — TETRACAINE HCL 0.5 % OP SOLN
OPHTHALMIC | Status: AC
  Filled 2013-05-17: qty 2

## 2013-05-17 MED ORDER — LIDOCAINE HCL 3.5 % OP GEL
OPHTHALMIC | Status: AC
  Filled 2013-05-17: qty 1

## 2013-05-18 ENCOUNTER — Encounter (HOSPITAL_COMMUNITY): Payer: Self-pay | Admitting: *Deleted

## 2013-05-18 ENCOUNTER — Encounter (HOSPITAL_COMMUNITY)
Admission: RE | Disposition: A | Discharge status: Home / Self Care | Payer: Self-pay | Source: Ambulatory Visit | Attending: Ophthalmology

## 2013-05-18 ENCOUNTER — Encounter (HOSPITAL_COMMUNITY): Payer: Medicare Other | Admitting: Anesthesiology

## 2013-05-18 ENCOUNTER — Ambulatory Visit (HOSPITAL_COMMUNITY): Payer: Medicare Other | Admitting: Anesthesiology

## 2013-05-18 ENCOUNTER — Ambulatory Visit (HOSPITAL_COMMUNITY)
Admission: RE | Admit: 2013-05-18 | Discharge: 2013-05-18 | Disposition: A | Discharge status: Home / Self Care | Payer: Medicare Other | Source: Ambulatory Visit | Attending: Ophthalmology | Admitting: Ophthalmology

## 2013-05-18 DIAGNOSIS — Z7982 Long term (current) use of aspirin: Secondary | ICD-10-CM | POA: Insufficient documentation

## 2013-05-18 DIAGNOSIS — M129 Arthropathy, unspecified: Secondary | ICD-10-CM | POA: Insufficient documentation

## 2013-05-18 DIAGNOSIS — E669 Obesity, unspecified: Secondary | ICD-10-CM | POA: Insufficient documentation

## 2013-05-18 DIAGNOSIS — I10 Essential (primary) hypertension: Secondary | ICD-10-CM | POA: Insufficient documentation

## 2013-05-18 DIAGNOSIS — H2589 Other age-related cataract: Secondary | ICD-10-CM | POA: Insufficient documentation

## 2013-05-18 HISTORY — PX: CATARACT EXTRACTION W/PHACO: SHX586

## 2013-05-18 SURGERY — PHACOEMULSIFICATION, CATARACT, WITH IOL INSERTION
Anesthesia: Monitor Anesthesia Care | Site: Eye | Laterality: Right

## 2013-05-18 MED ORDER — LACTATED RINGERS IV SOLN
INTRAVENOUS | Status: DC
  Administered 2013-05-18: 08:00:00 via INTRAVENOUS

## 2013-05-18 MED ORDER — EPINEPHRINE HCL 1 MG/ML IJ SOLN
INTRAOCULAR | Status: DC | PRN
  Administered 2013-05-18: 08:00:00

## 2013-05-18 MED ORDER — TETRACAINE HCL 0.5 % OP SOLN
1.0000 [drp] | OPHTHALMIC | Status: AC
  Administered 2013-05-18 (×3): 1 [drp] via OPHTHALMIC

## 2013-05-18 MED ORDER — EPINEPHRINE HCL 1 MG/ML IJ SOLN
INTRAMUSCULAR | Status: AC
  Filled 2013-05-18: qty 1

## 2013-05-18 MED ORDER — LIDOCAINE HCL 3.5 % OP GEL
1.0000 "application " | Freq: Once | OPHTHALMIC | Status: AC
  Administered 2013-05-18: 1 via OPHTHALMIC

## 2013-05-18 MED ORDER — LIDOCAINE HCL (PF) 1 % IJ SOLN
INTRAMUSCULAR | Status: DC | PRN
  Administered 2013-05-18: .5 mL

## 2013-05-18 MED ORDER — PROVISC 10 MG/ML IO SOLN
INTRAOCULAR | Status: DC | PRN
Start: 2013-05-18 — End: 2013-05-18
  Administered 2013-05-18: 0.85 mL via INTRAOCULAR

## 2013-05-18 MED ORDER — PHENYLEPHRINE HCL 2.5 % OP SOLN
1.0000 [drp] | OPHTHALMIC | Status: AC
  Administered 2013-05-18 (×3): 1 [drp] via OPHTHALMIC

## 2013-05-18 MED ORDER — CYCLOPENTOLATE-PHENYLEPHRINE 0.2-1 % OP SOLN
1.0000 [drp] | OPHTHALMIC | Status: AC
  Administered 2013-05-18 (×3): 1 [drp] via OPHTHALMIC

## 2013-05-18 MED ORDER — POVIDONE-IODINE 5 % OP SOLN
OPHTHALMIC | Status: DC | PRN
  Administered 2013-05-18: 1 via OPHTHALMIC

## 2013-05-18 MED ORDER — NEOMYCIN-POLYMYXIN-DEXAMETH 3.5-10000-0.1 OP SUSP
OPHTHALMIC | Status: DC | PRN
  Administered 2013-05-18: 2 [drp] via OPHTHALMIC

## 2013-05-18 MED ORDER — BSS IO SOLN
INTRAOCULAR | Status: DC | PRN
  Administered 2013-05-18: 15 mL via INTRAOCULAR

## 2013-05-18 MED ORDER — MIDAZOLAM HCL 2 MG/2ML IJ SOLN
1.0000 mg | INTRAMUSCULAR | Status: DC | PRN
  Administered 2013-05-18 (×2): 2 mg via INTRAVENOUS
  Filled 2013-05-18 (×3): qty 2

## 2013-05-18 SURGICAL SUPPLY — 32 items

## 2013-05-18 NOTE — Transfer of Care (Signed)
Immediate Anesthesia Transfer of Care Note  Patient: [name] Fry  Procedure(s) Performed: Procedure(s) with comments: CATARACT EXTRACTION PHACO AND INTRAOCULAR LENS PLACEMENT RIGHT EYE (Right) - CDE:13.35  Patient Location: Short Stay  Anesthesia Type:MAC  Level of Consciousness: awake, alert , oriented and patient cooperative  Airway & Oxygen Therapy: Patient Spontanous Breathing  Post-op Assessment: Report given to PACU RN, Post -op Vital signs reviewed and stable and Patient moving all extremities  Post vital signs: Reviewed and stable  Complications: No apparent anesthesia complications

## 2013-05-18 NOTE — Anesthesia Postprocedure Evaluation (Signed)
  Anesthesia Post-op Note  Patient: [name] Fry  Procedure(s) Performed: Procedure(s) with comments: CATARACT EXTRACTION PHACO AND INTRAOCULAR LENS PLACEMENT RIGHT EYE (Right) - CDE:13.35  Patient Location: Short Stay  Anesthesia Type:MAC  Level of Consciousness: awake, alert , oriented and patient cooperative  Airway and Oxygen Therapy: Patient Spontanous Breathing  Post-op Pain: none  Post-op Assessment: Post-op Vital signs reviewed, Patient's Cardiovascular Status Stable, Respiratory Function Stable and Pain level controlled  Post-op Vital Signs: Reviewed and stable  Complications: No apparent anesthesia complications

## 2013-05-18 NOTE — Discharge Instructions (Signed)

## 2013-05-18 NOTE — Op Note (Signed)
Date of Admission: 05/18/2013  Date of Surgery: 05/18/2013  Pre-Op Dx: Cataract  Right  Eye  Post-Op Dx: Combined Cataract  Right  Eye,  Dx Code 366.19  Surgeon: Tonny Branch, M.D.  Assistants: None  Anesthesia: Topical with MAC  Indications: Painless, progressive loss of vision with compromise of daily activities.  Surgery: Cataract Extraction with Intraocular lens Implant Right Eye  Discription: The patient had dilating drops and viscous lidocaine placed into the right eye in the pre-op holding area. After transfer to the operating room, a time out was performed. The patient was then prepped and draped. Beginning with a 15 degree blade a paracentesis port was made at the surgeon's 2 o'clock position. The anterior chamber was then filled with 1% non-preserved lidocaine. This was followed by filling the anterior chamber with Provisc.  A 2.57mm keratome blade was used to make a clear corneal incision at the temporal limbus.  A bent cystatome needle was used to create a continuous tear capsulotomy. Hydrodissection was performed with balanced salt solution on a Fine canula. The lens nucleus was then removed using the phacoemulsification handpiece. Residual cortex was removed with the I&A handpiece. The anterior chamber and capsular bag were refilled with Provisc. A posterior chamber intraocular lens was placed into the capsular bag with it's injector. The implant was positioned with the Kuglan hook. The Provisc was then removed from the anterior chamber and capsular bag with the I&A handpiece. Stromal hydration of the main incision and paracentesis port was performed with BSS on a Fine canula. The wounds were tested for leak which was negative. The patient tolerated the procedure well. There were no operative complications. The patient was then transferred to the recovery room in stable condition.  Complications: None  Specimen: None  EBL: None  Prosthetic device: B&L enVista, MX60, power 19.5D, SN  [id number].

## 2013-05-18 NOTE — Anesthesia Preprocedure Evaluation (Signed)
Anesthesia Evaluation  Patient identified by MRN, date of birth, ID band Patient awake    Reviewed: Allergy & Precautions, H&P , NPO status , Patient's Chart, lab work & pertinent test results  Airway Mallampati: III TM Distance: >3 FB Neck ROM: full  Mouth opening: Limited Mouth Opening  Dental  (+) Teeth Intact   Pulmonary neg pulmonary ROS,  breath sounds clear to auscultation        Cardiovascular hypertension, Pt. on medications + CAD and + Cardiac Stents Rhythm:Regular Rate:Normal     Neuro/Psych    GI/Hepatic hiatal hernia, GERD-  Medicated,  Endo/Other  Hypothyroidism obese  Renal/GU      Musculoskeletal  (+) Arthritis -,   Abdominal   Peds  Hematology   Anesthesia Other Findings   Reproductive/Obstetrics                           Anesthesia Physical Anesthesia Plan  ASA: III  Anesthesia Plan: MAC   Post-op Pain Management:    Induction: Intravenous  Airway Management Planned: Nasal Cannula  Additional Equipment:   Intra-op Plan:   Post-operative Plan:   Informed Consent: I have reviewed the patients History and Physical, chart, labs and discussed the procedure including the risks, benefits and alternatives for the proposed anesthesia with the patient or authorized representative who has indicated his/her understanding and acceptance.     Plan Discussed with:   Anesthesia Plan Comments:         Anesthesia Quick Evaluation

## 2013-05-18 NOTE — H&P (Signed)
I have reviewed the H&P, the patient was re-examined, and I have identified no interval changes in medical condition and plan of care since the history and physical of record  

## 2013-05-19 ENCOUNTER — Encounter (HOSPITAL_COMMUNITY): Payer: Self-pay | Admitting: Ophthalmology

## 2013-06-23 ENCOUNTER — Other Ambulatory Visit: Payer: Self-pay | Admitting: Cardiology

## 2013-07-31 ENCOUNTER — Ambulatory Visit (INDEPENDENT_AMBULATORY_CARE_PROVIDER_SITE_OTHER): Payer: Medicare Other | Admitting: Ophthalmology

## 2013-07-31 DIAGNOSIS — I10 Essential (primary) hypertension: Secondary | ICD-10-CM

## 2013-07-31 DIAGNOSIS — H35039 Hypertensive retinopathy, unspecified eye: Secondary | ICD-10-CM

## 2013-07-31 DIAGNOSIS — H3530 Unspecified macular degeneration: Secondary | ICD-10-CM

## 2013-07-31 DIAGNOSIS — H35379 Puckering of macula, unspecified eye: Secondary | ICD-10-CM

## 2013-07-31 DIAGNOSIS — H43819 Vitreous degeneration, unspecified eye: Secondary | ICD-10-CM

## 2013-10-23 ENCOUNTER — Ambulatory Visit (INDEPENDENT_AMBULATORY_CARE_PROVIDER_SITE_OTHER): Payer: Medicare Other | Admitting: Cardiology

## 2013-10-23 ENCOUNTER — Encounter: Payer: Self-pay | Admitting: Cardiology

## 2013-10-23 VITALS — BP 138/82 | HR 65 | Ht 63.0 in | Wt 210.0 lb

## 2013-10-23 DIAGNOSIS — E785 Hyperlipidemia, unspecified: Secondary | ICD-10-CM

## 2013-10-23 DIAGNOSIS — I2510 Atherosclerotic heart disease of native coronary artery without angina pectoris: Secondary | ICD-10-CM

## 2013-10-23 NOTE — Patient Instructions (Signed)

## 2013-10-23 NOTE — Assessment & Plan Note (Signed)
Symptomatically stable on medical therapy. ECG reviewed. Continue observation for now. I did recommend that she consider a regular walking regimen for exercise, possibly even pool aerobics.

## 2013-10-23 NOTE — Assessment & Plan Note (Signed)
Intolerance to statins. Lipids are not well controlled based on last assessment. I did recommend Zetia and increasing omega-3 supplements. She does not want to use Zetia at this point. Also not interested in referral to the lipid clinic.

## 2013-10-23 NOTE — Progress Notes (Signed)
    Clinical Summary Ms. Sapien is a 68 y.o.female last seen in January. She reports no angina symptoms. Complains of not being able to lose any weight. She does not however exercise with any regularity. Also states that her diet has not been as well controlled.  She has a history of statin intolerance. She continues on coenzyme Q Q10 and omega-3 supplements. She has preferred not to try any additional statin agents, we did recommend Zetia advancing her omega-3 supplements. Lipid panel in January showed cholesterol 309, triglycerides 356, HDL 51, LDL 187. She was not interested in taking Zetia.  ECG today shows sinus rhythm with decreased R-wave progression, nonspecific T-wave changes.   Allergies  Allergen Reactions  . Doxycycline Rash    Current Outpatient Prescriptions  Medication Sig Dispense Refill  . aspirin 325 MG tablet Take 325 mg by mouth daily.      Marland Kitchen levothyroxine (SYNTHROID, LEVOTHROID) 112 MCG tablet Take 112 mcg by mouth daily.       . metoprolol succinate (TOPROL-XL) 25 MG 24 hr tablet Take 25 mg by mouth daily.      . Multiple Vitamins-Minerals (OCUVITE PRESERVISION PO) Take 2 tablets by mouth daily.      . nitroGLYCERIN (NITROSTAT) 0.4 MG SL tablet Place 0.4 mg under the tongue every 5 (five) minutes as needed for chest pain.      . Omega-3 Fatty Acids (FISH OIL) 1000 MG CAPS Take 1 capsule (1,000 mg total) by mouth 2 (two) times daily.      . Turmeric 500 MG CAPS Take 500 mg by mouth daily.       No current facility-administered medications for this visit.    Past Medical History  Diagnosis Date  . Hypothyroidism   . Breast cancer   . Hyperlipidemia   . GERD (gastroesophageal reflux disease)   . Coronary atherosclerosis of native coronary artery     DES OM 3/12  . Essential hypertension, benign   . H/O hiatal hernia   . Arthritis   . Cataracts, bilateral     Social History Ms. Vaux reports that she has never smoked. She has never used smokeless  tobacco. Ms. Malacara reports that she does not drink alcohol.  Review of Systems No palpitations, dizziness, syncope. No bleeding episodes. Arthritic pains. Leg discomfort at nighttime. No claudication. Other systems reviewed and negative.  Physical Examination Filed Vitals:   10/23/13 1121  BP: 138/82  Pulse: 65   Filed Weights   10/23/13 1121  Weight: 210 lb (95.255 kg)    Comfortable at rest.  HEENT: Conjunctiva and lids normal, oropharynx with moist mucosa.  Neck: Supple, no elevated JVP or bruits.  Lungs: Clear to auscultation, nonlabored.  Cardiac: Regular rate and rhythm, no S3.  Abdomen: Soft, nontender, bowel sounds present.  Skin: Warm and dry.  Extremities: No pitting edema.  Neuropsychiatric: Alert and oriented x3, affect appropriate.     Problem List and Plan   Coronary atherosclerosis of native coronary artery Symptomatically stable on medical therapy. ECG reviewed. Continue observation for now. I did recommend that she consider a regular walking regimen for exercise, possibly even pool aerobics.  Hyperlipidemia Intolerance to statins. Lipids are not well controlled based on last assessment. I did recommend Zetia and increasing omega-3 supplements. She does not want to use Zetia at this point. Also not interested in referral to the lipid clinic.    Satira Sark, M.D., F.A.C.C.

## 2014-05-03 ENCOUNTER — Encounter: Payer: Self-pay | Admitting: Cardiology

## 2014-05-03 ENCOUNTER — Encounter: Payer: Self-pay | Admitting: *Deleted

## 2014-05-03 ENCOUNTER — Ambulatory Visit (INDEPENDENT_AMBULATORY_CARE_PROVIDER_SITE_OTHER): Payer: Medicare Other | Admitting: Cardiology

## 2014-05-03 VITALS — BP 160/90 | HR 72 | Ht 63.0 in | Wt 209.0 lb

## 2014-05-03 DIAGNOSIS — R0602 Shortness of breath: Secondary | ICD-10-CM | POA: Diagnosis not present

## 2014-05-03 DIAGNOSIS — E782 Mixed hyperlipidemia: Secondary | ICD-10-CM | POA: Diagnosis not present

## 2014-05-03 DIAGNOSIS — I2510 Atherosclerotic heart disease of native coronary artery without angina pectoris: Secondary | ICD-10-CM | POA: Diagnosis not present

## 2014-05-03 DIAGNOSIS — I10 Essential (primary) hypertension: Secondary | ICD-10-CM | POA: Diagnosis not present

## 2014-05-03 DIAGNOSIS — E669 Obesity, unspecified: Secondary | ICD-10-CM | POA: Diagnosis not present

## 2014-05-03 MED ORDER — AMLODIPINE BESYLATE 2.5 MG PO TABS: 2.5000 mg | ORAL_TABLET | Freq: Every day | ORAL | Status: DC

## 2014-05-03 NOTE — Progress Notes (Signed)
Cardiology Office Note  Date: 05/03/2014   ID: MEKIA DIPINTO, DOB February 16, 1946, MRN [id number]  PCP: [name], MD  Primary Cardiologist: Rozann Lesches, MD   Chief Complaint  Patient presents with  . Coronary Artery Disease  . Hypertension  . Hyperlipidemia    History of Present Illness: [name] Fry is a 69 y.o. female last seen in August 2015. She presents for a routine visit. She has been concerned about her blood pressure, we reviewed home checks. She has been hypertensive the large majority of the time, sometimes significantly so. Heart rate has generally been well controlled. She reports compliance with Toprol-XL.  She does not endorse any angina, does get short of breath with activity such as walking up hill or up steps. She has not been exercising regularly, although seems to be focused on trying to lose some weight and will be getting a treadmill soon.  She has a history of statin intolerance, has also declined Zetia and evaluation in the lipid clinic.  Today we reviewed her medications and cardiac history including DES to the obtuse marginal in 2012. She has not had follow-up ischemic testing.   Past Medical History  Diagnosis Date  . Hypothyroidism   . Breast cancer   . Hyperlipidemia   . GERD (gastroesophageal reflux disease)   . Coronary atherosclerosis of native coronary artery     DES OM 3/12  . Essential hypertension, benign   . H/O hiatal hernia   . Arthritis   . Cataracts, bilateral     Past Surgical History  Procedure Laterality Date  . Mastectomy Left     1997; S/P chemotherapy/radiation  . Cholecystectomy    . Appendectomy    . Tubal ligation    . 25 gauge pars plana vitrectomy with 20 gauge mvr port Right 09/29/2012    Procedure: 25 GAUGE PARS PLANA VITRECTOMY WITH 20 GAUGE MVR PORT;  Surgeon: Hayden Pedro, MD;  Location: [place] [facility];  Service: Ophthalmology;  Laterality: Right;  . Laser photo ablation Right 09/29/2012    Procedure: LASER PHOTO  ABLATION;  Surgeon: Hayden Pedro, MD;  Location: [place];  Service: Ophthalmology;  Laterality: Right;  Headscope laser  . Membrane peel Right 09/29/2012    Procedure: MEMBRANE PEEL;  Surgeon: Hayden Pedro, MD;  Location: [place];  Service: Ophthalmology;  Laterality: Right;  . Gas/fluid exchange Right 09/29/2012    Procedure: GAS/FLUID EXCHANGE;  Surgeon: Hayden Pedro, MD;  Location: [place];  Service: Ophthalmology;  Laterality: Right;  . Cataract extraction w/phaco Right 05/18/2013    Procedure: CATARACT EXTRACTION PHACO AND INTRAOCULAR LENS PLACEMENT RIGHT EYE;  Surgeon: Tonny Branch, MD;  Location: [facility];  Service: Ophthalmology;  Laterality: Right;  CDE:13.35    Current Outpatient Prescriptions  Medication Sig Dispense Refill  . aspirin 325 MG tablet Take 325 mg by mouth daily.    Marland Kitchen levothyroxine (SYNTHROID, LEVOTHROID) 112 MCG tablet Take 112 mcg by mouth daily.     . metoprolol succinate (TOPROL-XL) 25 MG 24 hr tablet Take 25 mg by mouth daily.    . Multiple Vitamins-Minerals (OCUVITE PRESERVISION PO) Take 2 tablets by mouth daily.    . nitroGLYCERIN (NITROSTAT) 0.4 MG SL tablet Place 0.4 mg under the tongue every 5 (five) minutes as needed for chest pain.    . Omega-3 Fatty Acids (FISH OIL) 1000 MG CAPS Take 1 capsule (1,000 mg total) by mouth 2 (two) times daily.    . Turmeric 500 MG  CAPS Take 500 mg by mouth daily.    Marland Kitchen amLODipine (NORVASC) 2.5 MG tablet Take 1 tablet (2.5 mg total) by mouth daily. 90 tablet 3   No current facility-administered medications for this visit.    Allergies:  Doxycycline   Social History: The patient  reports that she has never smoked. She has never used smokeless tobacco. She reports that she does not drink alcohol or use illicit drugs.   ROS:  Please see the history of present illness. Otherwise, complete review of systems is positive for none.  All other systems are reviewed and negative.    Physical Exam: VS:  BP 160/90 mmHg  Pulse 72  Ht 5'  3" (1.6 m)  Wt 209 lb (94.802 kg)  BMI 37.03 kg/m2  LMP 09/28/2012, BMI Body mass index is 37.03 kg/(m^2).  Wt Readings from Last 3 Encounters:  05/03/14 209 lb (94.802 kg)  10/23/13 210 lb (95.255 kg)  04/11/13 211 lb (95.709 kg)     Comfortable at rest.  HEENT: Conjunctiva and lids normal, oropharynx with moist mucosa.  Neck: Supple, no elevated JVP or bruits.  Lungs: Clear to auscultation, nonlabored.  Cardiac: Regular rate and rhythm, no S3.  Abdomen: Soft, nontender, bowel sounds present.  Skin: Warm and dry.  Extremities: No pitting edema.  Neuropsychiatric: Alert and oriented x3, affect appropriate.    ECG: ECG is not ordered today.   Recent Labwork: 05/15/2013: BUN 17; Creatinine 0.70; Hemoglobin 12.7; Potassium 4.3; Sodium 141  No results found for: CHOL, TRIG, HDL, CHOLHDL, VLDL, LDLCALC, LDLDIRECT   Assessment and Plan:  1. Dyspnea on exertion. This could be multifactorial in the setting of CAD, obesity, deconditioning, and hypertension. She underwent DES to the obtuse marginal in 2012, has not had follow-up ischemic evaluation. Plan is to add Norvasc 2.5 mg daily for better blood pressure control, also as antianginal treatment. We will proceed with a Lexiscan Cardiolite to follow-up ischemic burden. Office follow-up arranged.  2. CAD status post DES to the obtuse marginal in 2012.  3. Essential hypertension, blood pressure is not optimally controlled. We are adding Norvasc as outlined above.  4. Obesity, patient is more focused on trying to lose weight.  5. Hyperlipidemia with statin intolerance.  Current medicines are reviewed at length with the patient today.  The patient does not have concerns regarding medicines.   Orders Placed This Encounter  Procedures  . NM Myocar Multi W/Spect W/Wall Motion / EF  . Myocardial Perfusion Imaging    Disposition: FU with me in 1 month.   Signed, Satira Sark, MD, [facility] 05/03/2014 12:05 PM    [facility], [place], [place] [address] Phone: [phone]; Fax: [phone]

## 2014-05-03 NOTE — Patient Instructions (Signed)
Your physician recommends that you schedule a follow-up appointment in: 1 month. Your physician has recommended you make the following change in your medication:  Start amlodipine 2.5 mg daily. Continue all other medications the same. Your physician has requested that you have a lexiscan myoview. For further information please visit [email or web address]. Please follow instruction sheet, as given.

## 2014-05-10 ENCOUNTER — Encounter (HOSPITAL_COMMUNITY)
Admission: RE | Admit: 2014-05-10 | Discharge: 2014-05-10 | Disposition: A | Discharge status: Home / Self Care | Payer: Medicare Other | Source: Ambulatory Visit | Attending: Cardiology | Admitting: Cardiology

## 2014-05-10 ENCOUNTER — Ambulatory Visit (HOSPITAL_COMMUNITY)
Admission: RE | Admit: 2014-05-10 | Discharge: 2014-05-10 | Disposition: A | Discharge status: Home / Self Care | Payer: Medicare Other | Source: Ambulatory Visit | Attending: Cardiology | Admitting: Cardiology

## 2014-05-10 ENCOUNTER — Encounter (HOSPITAL_COMMUNITY): Payer: Self-pay

## 2014-05-10 DIAGNOSIS — E785 Hyperlipidemia, unspecified: Secondary | ICD-10-CM | POA: Diagnosis not present

## 2014-05-10 DIAGNOSIS — R0609 Other forms of dyspnea: Secondary | ICD-10-CM | POA: Insufficient documentation

## 2014-05-10 DIAGNOSIS — I2510 Atherosclerotic heart disease of native coronary artery without angina pectoris: Secondary | ICD-10-CM | POA: Insufficient documentation

## 2014-05-10 DIAGNOSIS — E669 Obesity, unspecified: Secondary | ICD-10-CM | POA: Diagnosis not present

## 2014-05-10 DIAGNOSIS — I10 Essential (primary) hypertension: Secondary | ICD-10-CM | POA: Diagnosis not present

## 2014-05-10 MED ORDER — REGADENOSON 0.4 MG/5ML IV SOLN
INTRAVENOUS | Status: AC
Start: 2014-05-10 — End: 2014-05-10
  Administered 2014-05-10: 0.4 mg via INTRAVENOUS
  Filled 2014-05-10: qty 5

## 2014-05-10 MED ORDER — SODIUM CHLORIDE 0.9 % IJ SOLN
10.0000 mL | INTRAMUSCULAR | Status: DC | PRN
  Administered 2014-05-10: 10 mL via INTRAVENOUS
  Filled 2014-05-10: qty 10

## 2014-05-10 MED ORDER — SODIUM CHLORIDE 0.9 % IJ SOLN
INTRAMUSCULAR | Status: AC
  Administered 2014-05-10: 10 mL via INTRAVENOUS
  Filled 2014-05-10: qty 3

## 2014-05-10 MED ORDER — REGADENOSON 0.4 MG/5ML IV SOLN
0.4000 mg | Freq: Once | INTRAVENOUS | Status: AC | PRN
  Administered 2014-05-10: 0.4 mg via INTRAVENOUS

## 2014-05-10 MED ORDER — TECHNETIUM TC 99M SESTAMIBI - CARDIOLITE
10.0000 | Freq: Once | INTRAVENOUS | Status: AC | PRN
  Administered 2014-05-10: 08:00:00 10 via INTRAVENOUS

## 2014-05-10 MED ORDER — TECHNETIUM TC 99M SESTAMIBI GENERIC - CARDIOLITE
30.0000 | Freq: Once | INTRAVENOUS | Status: AC | PRN
  Administered 2014-05-10: 30 via INTRAVENOUS

## 2014-05-10 NOTE — Progress Notes (Signed)
Stress Lab Nurses Notes - [place] 05/10/2014 Reason for doing test: CAD and DOE Type of test: Leane Call Nurse performing test: Gerrit Halls, RN Nuclear Medicine Tech: Melburn Hake Echo Tech: Not Applicable MD performing test: Branch/K.Purcell Nails NP Family MD: Nadara Mustard Test explained and consent signed: Yes.   IV started: Saline lock flushed, No redness or edema and Saline lock started in radiology Symptoms: pressure in chest Treatment/Intervention: None Reason test stopped: protocol completed After recovery IV was: Discontinued via X-ray tech and No redness or edema Patient to return to Nuc. Med at :9:45 Patient discharged: Home Patient's Condition upon discharge was: stable Comments: During test BP 109/70 & HR 88.  Recovery BP 119/70 & HR 76.  Symptoms resolved in recovery. Geanie Cooley T

## 2014-05-14 ENCOUNTER — Telehealth: Payer: Self-pay | Admitting: *Deleted

## 2014-05-14 ENCOUNTER — Other Ambulatory Visit: Payer: Self-pay | Admitting: *Deleted

## 2014-05-14 MED ORDER — NITROGLYCERIN 0.4 MG SL SUBL: 0.4000 mg | SUBLINGUAL_TABLET | SUBLINGUAL | Status: DC | PRN

## 2014-05-14 NOTE — Telephone Encounter (Signed)
-----   Message from Satira Sark, MD sent at 05/10/2014  4:44 PM EST ----- Reviewed report. Stress test was abnormal with a mild degree of inferoseptal ischemia but overall low risk findings and normal LVEF. We added Norvasc recently for antianginal and blood pressure benefit. Keep office follow-up and we will see how she is doing to determine if any additional evaluation is needed.

## 2014-05-14 NOTE — Telephone Encounter (Signed)
Patient informed. 

## 2014-06-01 ENCOUNTER — Ambulatory Visit (INDEPENDENT_AMBULATORY_CARE_PROVIDER_SITE_OTHER): Payer: Medicare Other | Admitting: Cardiology

## 2014-06-01 ENCOUNTER — Encounter: Payer: Self-pay | Admitting: Cardiology

## 2014-06-01 VITALS — BP 129/81 | HR 71 | Ht 63.0 in | Wt 207.1 lb

## 2014-06-01 DIAGNOSIS — I25119 Atherosclerotic heart disease of native coronary artery with unspecified angina pectoris: Secondary | ICD-10-CM | POA: Diagnosis not present

## 2014-06-01 DIAGNOSIS — I10 Essential (primary) hypertension: Secondary | ICD-10-CM

## 2014-06-01 DIAGNOSIS — E782 Mixed hyperlipidemia: Secondary | ICD-10-CM

## 2014-06-01 NOTE — Patient Instructions (Signed)
Your physician recommends that you schedule a follow-up appointment in: 3 months. Your physician recommends that you continue on your current medications as directed. Please refer to the Current Medication list given to you today. 

## 2014-06-01 NOTE — Progress Notes (Signed)
Cardiology Office Note  Date: 06/01/2014   ID: [name] Fry, DOB 02/07/1946, MRN [id number]  PCP: [name], MD  Primary Cardiologist: Rozann Lesches, MD   Chief Complaint  Patient presents with  . Coronary Artery Disease  . Follow-up testing    History of Present Illness: [name] Fry is a 69 y.o. female last seen in February. At that time we added Norvasc to her regimen and arranged follow-up ischemic testing which is outlined below. She is here with her daughter today to discuss the results. Overall she reports no change in symptoms, still has shortness of breath with activity intermittently. Her stress test was overall low risk indicating probable mild area of ischemia in the inferoseptal wall and therefore some progression in CAD. We discussed options including medical therapy with walking regimen and weight loss, versus pursuing a cardiac catheterization if her symptoms worsen. For now she was comfortable with the former.   Past Medical History  Diagnosis Date  . Hypothyroidism   . Hyperlipidemia   . GERD (gastroesophageal reflux disease)   . Coronary atherosclerosis of native coronary artery     DES OM 3/12  . Essential hypertension, benign   . H/O hiatal hernia   . Arthritis   . Cataracts, bilateral   . Breast cancer     Left    Past Surgical History  Procedure Laterality Date  . Mastectomy Left     1997; S/P chemotherapy/radiation  . Cholecystectomy    . Appendectomy    . Tubal ligation    . 25 gauge pars plana vitrectomy with 20 gauge mvr port Right 09/29/2012    Procedure: 25 GAUGE PARS PLANA VITRECTOMY WITH 20 GAUGE MVR PORT;  Surgeon: Hayden Pedro, MD;  Location: [place];  Service: Ophthalmology;  Laterality: Right;  . Laser photo ablation Right 09/29/2012    Procedure: LASER PHOTO ABLATION;  Surgeon: Hayden Pedro, MD;  Location: [place];  Service: Ophthalmology;  Laterality: Right;  Headscope laser  . Membrane peel Right 09/29/2012    Procedure:  MEMBRANE PEEL;  Surgeon: Hayden Pedro, MD;  Location: [place];  Service: Ophthalmology;  Laterality: Right;  . Gas/fluid exchange Right 09/29/2012    Procedure: GAS/FLUID EXCHANGE;  Surgeon: Hayden Pedro, MD;  Location: [place];  Service: Ophthalmology;  Laterality: Right;  . Cataract extraction w/phaco Right 05/18/2013    Procedure: CATARACT EXTRACTION PHACO AND INTRAOCULAR LENS PLACEMENT RIGHT EYE;  Surgeon: Tonny Branch, MD;  Location: [facility];  Service: Ophthalmology;  Laterality: Right;  CDE:13.35    Current Outpatient Prescriptions  Medication Sig Dispense Refill  . amLODipine (NORVASC) 2.5 MG tablet Take 1 tablet (2.5 mg total) by mouth daily. 90 tablet 3  . aspirin 325 MG tablet Take 325 mg by mouth daily.    Marland Kitchen levothyroxine (SYNTHROID, LEVOTHROID) 112 MCG tablet Take 112 mcg by mouth daily.     . metoprolol succinate (TOPROL-XL) 25 MG 24 hr tablet Take 25 mg by mouth daily.    . Multiple Vitamins-Minerals (OCUVITE PRESERVISION PO) Take 2 tablets by mouth daily.    . nitroGLYCERIN (NITROSTAT) 0.4 MG SL tablet Place 1 tablet (0.4 mg total) under the tongue every 5 (five) minutes x 3 doses as needed for chest pain. 25 tablet 3  . Omega-3 Fatty Acids (FISH OIL) 1000 MG CAPS Take 1 capsule (1,000 mg total) by mouth 2 (two) times daily.    . Turmeric 500 MG CAPS Take 500 mg by mouth daily.  No current facility-administered medications for this visit.    Allergies:  Doxycycline   Social History: The patient  reports that she has never smoked. She has never used smokeless tobacco. She reports that she does not drink alcohol or use illicit drugs.   ROS:  Please see the history of present illness. Otherwise, complete review of systems is positive for back pain, leg weakness.  All other systems are reviewed and negative.    Physical Exam: VS:  BP 129/81 mmHg  Pulse 71  Ht 5\' 3"  (1.6 m)  Wt 207 lb 1.8 oz (93.945 kg)  BMI 36.70 kg/m2  SpO2 98%  LMP 09/28/2012, BMI Body mass index is  36.7 kg/(m^2).  Wt Readings from Last 3 Encounters:  06/01/14 207 lb 1.8 oz (93.945 kg)  05/03/14 209 lb (94.802 kg)  10/23/13 210 lb (95.255 kg)     Comfortable at rest.  HEENT: Conjunctiva and lids normal, oropharynx with moist mucosa.  Neck: Supple, no elevated JVP or bruits.  Lungs: Clear to auscultation, nonlabored.  Cardiac: Regular rate and rhythm, no S3.  Abdomen: Soft, nontender, bowel sounds present.  Skin: Warm and dry.  Extremities: No pitting edema.  Neuropsychiatric: Alert and oriented x3, affect appropriate.    ECG: ECG is not ordered today.   Other Studies Reviewed Today:  Lexiscan Cardiolite 05/10/2014: MYOCARDIAL IMAGING WITH SPECT (REST AND PHARMACOLOGIC-STRESS)  GATED LEFT VENTRICULAR WALL MOTION STUDY  LEFT VENTRICULAR EJECTION FRACTION  TECHNIQUE: Standard myocardial SPECT imaging was performed after resting intravenous injection of 10 mCi Tc-59m sestamibi. Subsequently, intravenous infusion of Lexiscan was performed under the supervision of the Cardiology staff. At peak effect of the drug, 30 mCi Tc-97m sestamibi was injected intravenously and standard myocardial SPECT imaging was performed. Quantitative gated imaging was also performed to evaluate left ventricular wall motion, and estimate left ventricular ejection fraction.  COMPARISON: None.  FINDINGS: Stress/ECG data: The patient was stressed according to the Lexiscan protocol. The heart rate ranged from 65-88 beats per min. The blood pressure averaged 127/75.  Perfusion: There is a small area of mild to moderately decreased uptake in the inferoseptal wall extending from the mid left ventricle to the base with partial reversibility. This is suggestive of a mild degree of ischemia. However, variable soft tissue attenuation cannot entirely be ruled out.  Wall Motion: Inferoseptal hypokinesis. No left ventricular dilation.  Left Ventricular Ejection Fraction: 64  %  End diastolic volume 64 ml  End systolic volume 23 ml  IMPRESSION: 1. Probable mild degree of ischemia in inferoseptal wall.  2. Inferoseptal hypokinesis.  3. Left ventricular ejection fraction 64%  4. Low-risk stress test findings*.  Assessment and Plan:  1. CAD status post DES to the obtuse marginal in 2012. Recent Cardiolite shows probable mild area of ischemia in the inferoseptal wall. She likely has had some progression in CAD. For now she prefers observation with recent medication adjustments, will try walking regimen and weight loss. If her symptoms worsen however, we have discussed pursuing a cardiac catheterization. Follow-up scheduled.  2. Essential hypertension, blood pressure is better today.  3. Hyperlipidemia, she continues on omega-3 supplements.  Current medicines are reviewed at length with the patient today.     Disposition: FU with me in 3 months.   Signed, Satira Sark, MD, [facility] 06/01/2014 11:10 AM    [facility] [place], [place], [place] [address] Phone: [phone]; Fax: [phone]

## 2014-07-23 DIAGNOSIS — Z0000 Encounter for general adult medical examination without abnormal findings: Secondary | ICD-10-CM | POA: Diagnosis not present

## 2014-07-23 DIAGNOSIS — R531 Weakness: Secondary | ICD-10-CM | POA: Diagnosis not present

## 2014-07-23 DIAGNOSIS — E780 Pure hypercholesterolemia: Secondary | ICD-10-CM | POA: Diagnosis not present

## 2014-07-23 DIAGNOSIS — Z23 Encounter for immunization: Secondary | ICD-10-CM | POA: Diagnosis not present

## 2014-07-23 DIAGNOSIS — Z1389 Encounter for screening for other disorder: Secondary | ICD-10-CM | POA: Diagnosis not present

## 2014-07-23 DIAGNOSIS — E039 Hypothyroidism, unspecified: Secondary | ICD-10-CM | POA: Diagnosis not present

## 2014-08-07 ENCOUNTER — Ambulatory Visit (INDEPENDENT_AMBULATORY_CARE_PROVIDER_SITE_OTHER): Payer: Medicare Other | Admitting: Ophthalmology

## 2014-08-09 ENCOUNTER — Ambulatory Visit (INDEPENDENT_AMBULATORY_CARE_PROVIDER_SITE_OTHER): Payer: Medicare Other | Admitting: Ophthalmology

## 2014-08-09 DIAGNOSIS — H3531 Nonexudative age-related macular degeneration: Secondary | ICD-10-CM | POA: Diagnosis not present

## 2014-08-09 DIAGNOSIS — H35033 Hypertensive retinopathy, bilateral: Secondary | ICD-10-CM | POA: Diagnosis not present

## 2014-08-09 DIAGNOSIS — H43812 Vitreous degeneration, left eye: Secondary | ICD-10-CM | POA: Diagnosis not present

## 2014-08-09 DIAGNOSIS — I10 Essential (primary) hypertension: Secondary | ICD-10-CM | POA: Diagnosis not present

## 2014-08-28 ENCOUNTER — Ambulatory Visit: Payer: Medicare Other | Admitting: Cardiology

## 2014-09-03 DIAGNOSIS — D519 Vitamin B12 deficiency anemia, unspecified: Secondary | ICD-10-CM | POA: Diagnosis not present

## 2014-09-03 DIAGNOSIS — E559 Vitamin D deficiency, unspecified: Secondary | ICD-10-CM | POA: Diagnosis not present

## 2014-09-06 DIAGNOSIS — E039 Hypothyroidism, unspecified: Secondary | ICD-10-CM | POA: Diagnosis not present

## 2014-09-06 DIAGNOSIS — D519 Vitamin B12 deficiency anemia, unspecified: Secondary | ICD-10-CM | POA: Diagnosis not present

## 2014-09-06 DIAGNOSIS — E559 Vitamin D deficiency, unspecified: Secondary | ICD-10-CM | POA: Diagnosis not present

## 2014-09-06 DIAGNOSIS — E780 Pure hypercholesterolemia: Secondary | ICD-10-CM | POA: Diagnosis not present

## 2014-09-10 ENCOUNTER — Other Ambulatory Visit: Payer: Self-pay

## 2014-09-28 ENCOUNTER — Encounter: Payer: Self-pay | Admitting: Cardiology

## 2014-09-28 ENCOUNTER — Ambulatory Visit (INDEPENDENT_AMBULATORY_CARE_PROVIDER_SITE_OTHER): Payer: Medicare Other | Admitting: Cardiology

## 2014-09-28 VITALS — BP 138/82 | HR 72 | Ht 63.0 in | Wt 206.0 lb

## 2014-09-28 DIAGNOSIS — I2510 Atherosclerotic heart disease of native coronary artery without angina pectoris: Secondary | ICD-10-CM | POA: Diagnosis not present

## 2014-09-28 DIAGNOSIS — I10 Essential (primary) hypertension: Secondary | ICD-10-CM

## 2014-09-28 NOTE — Progress Notes (Signed)
Cardiology Office Note  Date: 09/28/2014   ID: [name] Fry, DOB Sep 17, 1945, MRN [id number]  PCP: [name], MD  Primary Cardiologist: Rozann Lesches, MD   Chief Complaint  Patient presents with  . Coronary Artery Disease    History of Present Illness: [name] Fry is a 69 y.o. female last seen in March. We have been managing her medically for CAD. She is here with her daughter for a follow-up visit. Reports that she has actually been feeling fairly well, no significant angina, has better energy. She attributes this to vitamin D and B12 supplements as well. She has been getting out in her garden and doing some walking for exercise.  Last addition to her cardiac regimen was Norvasc which she has tolerated well. Stress testing reviewed below.   Past Medical History  Diagnosis Date  . Hypothyroidism   . Hyperlipidemia   . GERD (gastroesophageal reflux disease)   . Coronary atherosclerosis of native coronary artery     DES OM 3/12  . Essential hypertension, benign   . H/O hiatal hernia   . Arthritis   . Cataracts, bilateral   . Breast cancer     Left    Current Outpatient Prescriptions  Medication Sig Dispense Refill  . amLODipine (NORVASC) 2.5 MG tablet Take 1 tablet (2.5 mg total) by mouth daily. 90 tablet 3  . aspirin 325 MG tablet Take 325 mg by mouth daily.    Marland Kitchen levothyroxine (SYNTHROID, LEVOTHROID) 112 MCG tablet Take 112 mcg by mouth daily.     . metoprolol succinate (TOPROL-XL) 25 MG 24 hr tablet Take 25 mg by mouth daily.    . Multiple Vitamins-Minerals (OCUVITE PRESERVISION PO) Take 2 tablets by mouth daily.    . nitroGLYCERIN (NITROSTAT) 0.4 MG SL tablet Place 1 tablet (0.4 mg total) under the tongue every 5 (five) minutes x 3 doses as needed for chest pain. 25 tablet 3  . Omega-3 Fatty Acids (FISH OIL) 1000 MG CAPS Take 1 capsule (1,000 mg total) by mouth 2 (two) times daily.    . Turmeric 500 MG CAPS Take 500 mg by mouth daily.     No current  facility-administered medications for this visit.    Allergies:  Doxycycline   Social History: The patient  reports that she has never smoked. She has never used smokeless tobacco. She reports that she does not drink alcohol or use illicit drugs.   ROS:  Please see the history of present illness. Otherwise, complete review of systems is positive for minor leg edema.  All other systems are reviewed and negative.   Physical Exam: VS:  BP 138/82 mmHg  Pulse 72  Ht 5\' 3"  (1.6 m)  Wt 206 lb (93.441 kg)  BMI 36.50 kg/m2  SpO2 97%  LMP 09/28/2012, BMI Body mass index is 36.5 kg/(m^2).  Wt Readings from Last 3 Encounters:  09/28/14 206 lb (93.441 kg)  06/01/14 207 lb 1.8 oz (93.945 kg)  05/03/14 209 lb (94.802 kg)     Comfortable at rest.  HEENT: Conjunctiva and lids normal, oropharynx with moist mucosa.  Neck: Supple, no elevated JVP or bruits.  Lungs: Clear to auscultation, nonlabored.  Cardiac: Regular rate and rhythm, no S3.  Abdomen: Soft, nontender, bowel sounds present.  Skin: Warm and dry.  Extremities: No pitting edema.  Neuropsychiatric: Alert and oriented x3, affect appropriate.   ECG: ECG is not ordered today.   Other Studies Reviewed Today:  Lexiscan Cardiolite 06/01/2014: FINDINGS: Stress/ECG data: The  patient was stressed according to the Lexiscan protocol. The heart rate ranged from 65-88 beats per min. The blood pressure averaged 127/75.  Perfusion: There is a small area of mild to moderately decreased uptake in the inferoseptal wall extending from the mid left ventricle to the base with partial reversibility. This is suggestive of a mild degree of ischemia. However, variable soft tissue attenuation cannot entirely be ruled out.  Wall Motion: Inferoseptal hypokinesis. No left ventricular dilation.  Left Ventricular Ejection Fraction: 64 %  End diastolic volume 64 ml  End systolic volume 23 ml  IMPRESSION: 1. Probable mild degree of  ischemia in inferoseptal wall.  2. Inferoseptal hypokinesis.  3. Left ventricular ejection fraction 64%  4. Low-risk stress test findings*.  Assessment and Plan:  1. Symptomatically stable CAD status post DES to the obtuse marginal in 2012. Stress testing from earlier in the year showed mild degree of ischemia in the inferior septal wall. She is not reporting any significant angina on medical therapy, we will continue observation unless symptoms progress.  2. Essential hypertension, no change to current regimen.  Current medicines were reviewed with the patient today.  Disposition: FU with me in 6 months.   Signed, Satira Sark, MD, [facility] 09/28/2014 2:46 PM    [facility] [place], [place], [place] [address] Phone: [phone]; Fax: [phone]

## 2014-09-28 NOTE — Patient Instructions (Signed)
Your physician recommends that you continue on your current medications as directed. Please refer to the Current Medication list given to you today. Your physician recommends that you schedule a follow-up appointment in: 6 months. You will receive a reminder letter in the mail in about 4 months reminding you to call and schedule your appointment. If you don't receive this letter, please contact our office. 

## 2014-10-26 DIAGNOSIS — Z9012 Acquired absence of left breast and nipple: Secondary | ICD-10-CM | POA: Diagnosis not present

## 2014-10-26 DIAGNOSIS — Z1231 Encounter for screening mammogram for malignant neoplasm of breast: Secondary | ICD-10-CM | POA: Diagnosis not present

## 2014-12-07 DIAGNOSIS — E559 Vitamin D deficiency, unspecified: Secondary | ICD-10-CM | POA: Diagnosis not present

## 2015-01-08 DIAGNOSIS — H1013 Acute atopic conjunctivitis, bilateral: Secondary | ICD-10-CM | POA: Diagnosis not present

## 2015-02-15 DIAGNOSIS — D519 Vitamin B12 deficiency anemia, unspecified: Secondary | ICD-10-CM | POA: Diagnosis not present

## 2015-02-15 DIAGNOSIS — E7800 Pure hypercholesterolemia, unspecified: Secondary | ICD-10-CM | POA: Diagnosis not present

## 2015-02-15 DIAGNOSIS — E039 Hypothyroidism, unspecified: Secondary | ICD-10-CM | POA: Diagnosis not present

## 2015-02-15 DIAGNOSIS — E559 Vitamin D deficiency, unspecified: Secondary | ICD-10-CM | POA: Diagnosis not present

## 2015-02-22 DIAGNOSIS — E039 Hypothyroidism, unspecified: Secondary | ICD-10-CM | POA: Diagnosis not present

## 2015-02-22 DIAGNOSIS — Z13220 Encounter for screening for lipoid disorders: Secondary | ICD-10-CM | POA: Diagnosis not present

## 2015-02-22 DIAGNOSIS — Z0000 Encounter for general adult medical examination without abnormal findings: Secondary | ICD-10-CM | POA: Diagnosis not present

## 2015-02-22 DIAGNOSIS — I209 Angina pectoris, unspecified: Secondary | ICD-10-CM | POA: Diagnosis not present

## 2015-02-22 DIAGNOSIS — D519 Vitamin B12 deficiency anemia, unspecified: Secondary | ICD-10-CM | POA: Diagnosis not present

## 2015-02-22 DIAGNOSIS — E559 Vitamin D deficiency, unspecified: Secondary | ICD-10-CM | POA: Diagnosis not present

## 2015-04-11 ENCOUNTER — Ambulatory Visit (INDEPENDENT_AMBULATORY_CARE_PROVIDER_SITE_OTHER): Payer: Medicare Other | Admitting: Cardiovascular Disease

## 2015-04-11 ENCOUNTER — Encounter: Payer: Self-pay | Admitting: Cardiovascular Disease

## 2015-04-11 VITALS — BP 137/78 | HR 56 | Ht 63.0 in | Wt 206.0 lb

## 2015-04-11 DIAGNOSIS — Z7189 Other specified counseling: Secondary | ICD-10-CM | POA: Diagnosis not present

## 2015-04-11 DIAGNOSIS — I4891 Unspecified atrial fibrillation: Secondary | ICD-10-CM | POA: Diagnosis not present

## 2015-04-11 DIAGNOSIS — I2510 Atherosclerotic heart disease of native coronary artery without angina pectoris: Secondary | ICD-10-CM

## 2015-04-11 DIAGNOSIS — Z7901 Long term (current) use of anticoagulants: Secondary | ICD-10-CM | POA: Diagnosis not present

## 2015-04-11 DIAGNOSIS — I10 Essential (primary) hypertension: Secondary | ICD-10-CM | POA: Diagnosis not present

## 2015-04-11 DIAGNOSIS — R002 Palpitations: Secondary | ICD-10-CM

## 2015-04-11 MED ORDER — RIVAROXABAN 20 MG PO TABS: 20.0000 mg | ORAL_TABLET | Freq: Every day | ORAL | Status: DC

## 2015-04-11 MED ORDER — ASPIRIN 81 MG PO TABS: 81.0000 mg | ORAL_TABLET | Freq: Every day | ORAL | Status: DC

## 2015-04-11 MED ORDER — METOPROLOL SUCCINATE ER 50 MG PO TB24: 50.0000 mg | ORAL_TABLET | Freq: Every day | ORAL | Status: DC

## 2015-04-11 NOTE — Progress Notes (Signed)
Patient ID: [name] Fry, female   DOB: [birth year], 70 y.o.   MRN: [id number]      SUBJECTIVE: The patient is a 70 year old woman with a history of coronary artery disease and normally follows with Dr. Domenic Polite. She was added onto my schedule for complains of palpitations.  Approximately one week ago she experienced the sudden onset of her heart racing. She tried some medication adjustments on her own which temporarily relieved her symptoms but she has essentially constantly had them over the past one week.   She denies chest pain and shortness of breath. She has a history of vertigo and has felt some lightheadedness but denies syncope. She also denies leg swelling. She denies any history of bleeding problems such as hematuria and hematochezia.  ECG performed in the office today demonstrates atrial flutter/fibrillation with variable conduction, heart rate 136 bpm, with an isolated PVC.   Review of Systems: As per "subjective", otherwise negative.  Allergies  Allergen Reactions  . Doxycycline Rash    Current Outpatient Prescriptions  Medication Sig Dispense Refill  . amLODipine (NORVASC) 2.5 MG tablet Take 1 tablet (2.5 mg total) by mouth daily. 90 tablet 3  . aspirin 325 MG tablet Take 325 mg by mouth daily.    . cholecalciferol (VITAMIN D) 1000 units tablet Take 1,000 Units by mouth 2 (two) times daily.    Marland Kitchen levothyroxine (SYNTHROID, LEVOTHROID) 112 MCG tablet Take 112 mcg by mouth daily.     . metoprolol succinate (TOPROL-XL) 25 MG 24 hr tablet Take 25 mg by mouth daily.    . Multiple Vitamins-Minerals (OCUVITE PRESERVISION PO) Take 2 tablets by mouth daily.    . nitroGLYCERIN (NITROSTAT) 0.4 MG SL tablet Place 1 tablet (0.4 mg total) under the tongue every 5 (five) minutes x 3 doses as needed for chest pain. 25 tablet 3  . Omega-3 Fatty Acids (FISH OIL) 1000 MG CAPS Take 1 capsule (1,000 mg total) by mouth 2 (two) times daily.    . vitamin B-12 (CYANOCOBALAMIN) 250 MCG tablet Take 500  mcg by mouth daily.    . Multiple Vitamins-Minerals (ECHINACEA ACZ PO) Take 1 tablet by mouth daily. Reported on 04/11/2015    . Turmeric 500 MG CAPS Take 500 mg by mouth daily. Reported on 04/11/2015     No current facility-administered medications for this visit.    Past Medical History  Diagnosis Date  . Hypothyroidism   . Hyperlipidemia   . GERD (gastroesophageal reflux disease)   . Coronary atherosclerosis of native coronary artery     DES OM 3/12  . Essential hypertension, benign   . H/O hiatal hernia   . Arthritis   . Cataracts, bilateral   . Breast cancer ([place])     Left    Past Surgical History  Procedure Laterality Date  . Mastectomy Left     1997; S/P chemotherapy/radiation  . Cholecystectomy    . Appendectomy    . Tubal ligation    . 25 gauge pars plana vitrectomy with 20 gauge mvr port Right 09/29/2012    Procedure: 25 GAUGE PARS PLANA VITRECTOMY WITH 20 GAUGE MVR PORT;  Surgeon: Hayden Pedro, MD;  Location: [place];  Service: Ophthalmology;  Laterality: Right;  . Laser photo ablation Right 09/29/2012    Procedure: LASER PHOTO ABLATION;  Surgeon: Hayden Pedro, MD;  Location: [place];  Service: Ophthalmology;  Laterality: Right;  Headscope laser  . Membrane peel Right 09/29/2012    Procedure: MEMBRANE PEEL;  Surgeon: Jenny Reichmann  Eber Jones, MD;  Location: [place];  Service: Ophthalmology;  Laterality: Right;  . Gas/fluid exchange Right 09/29/2012    Procedure: GAS/FLUID EXCHANGE;  Surgeon: Hayden Pedro, MD;  Location: [place];  Service: Ophthalmology;  Laterality: Right;  . Cataract extraction w/phaco Right 05/18/2013    Procedure: CATARACT EXTRACTION PHACO AND INTRAOCULAR LENS PLACEMENT RIGHT EYE;  Surgeon: Tonny Branch, MD;  Location: [facility];  Service: Ophthalmology;  Laterality: Right;  CDE:13.35    Social History   Social History  . Marital Status: Widowed    Spouse Name: N/A  . Number of Children: N/A  . Years of Education: N/A   Occupational History  . Retired     Social History Main Topics  . Smoking status: Never Smoker   . Smokeless tobacco: Never Used  . Alcohol Use: No  . Drug Use: No  . Sexual Activity: Not on file   Other Topics Concern  . Not on file   Social History Narrative     Filed Vitals:   04/11/15 1329  BP: 137/78  Pulse: 56  Height: 5\' 3"  (1.6 m)  Weight: 206 lb (93.441 kg)    PHYSICAL EXAM General: NAD HEENT: Normal. Neck: No JVD, no thyromegaly. Lungs: Clear to auscultation bilaterally with normal respiratory effort. CV: Tachycardic, irregular rhythm, normal S1/S2, no S3, no murmur. No pretibial or periankle edema.  No carotid bruit.    Abdomen: Soft, nontender, obese. Neurologic: Alert and oriented x 3.  Psych: Normal affect. Skin: Normal. Musculoskeletal: No gross deformities. Extremities: No clubbing or cyanosis.   ECG: Most recent ECG reviewed.      ASSESSMENT AND PLAN: 1. Atrial flutter/fibrillation with RVR: Increase Toprol-XL to 50 mg daily. CHA2DS2VASc score 4 thus elevated thromboembolic risk. Will initiate Xarelto 20 mg daily. No obvious murmurs to suggest valvular pathology. Would obtain echocardiogram once HR is more optimally controlled to evaluate cardiac structure and function. Will obtain BMET and CBC. Will make appt for anticoagulation clinic for initial visit.  2. CAD with PCI of obtuse marginal in 2012: Symptomatically stable in spite or tachycardia which is reassuring. Mild degree of inferoseptal ischemia as per stress test on 05/10/14 but low risk study overall. Will reduce ASA to 81 mg daily. On fish oil, not on statin therapy.  3. Essential HTN: Controlled. No changes.  Dispo: f/u 1 week with Dr. Domenic Polite.  Time spent: 40 minutes, of which greater than 50% was spent reviewing symptoms, relevant blood tests and studies, and discussing management plan with the patient.   Kate Sable, M.D., F.A.C.C.

## 2015-04-11 NOTE — Patient Instructions (Addendum)
   Decrease Aspirin to 81mg  daily   Begin Xarelto 20mg  daily with evening meal - samples, free 30-day supply card, & printed scripts given today.  Increase Toprol XL to 50mg  daily - new printed script given today.  Continue all other medications.   Labs for CBC, BMET - orders given today. Office will contact with results via phone or letter.   Follow up with anticoagulation nurse Lattie Haw) in 1 month. Follow up in 1 week with Dr. Domenic Polite

## 2015-04-15 ENCOUNTER — Telehealth: Payer: Self-pay | Admitting: *Deleted

## 2015-04-15 NOTE — Telephone Encounter (Signed)
Pt aware, routed to pcp 

## 2015-04-15 NOTE — Telephone Encounter (Signed)
-----   Message from [place] sent at 04/12/2015  1:54 PM EST -----   ----- Message -----    From: Herminio Commons, MD    Sent: 04/12/2015   1:50 PM      To: Onika Gudiel T Romeo Zielinski, CMA  [place].

## 2015-04-22 ENCOUNTER — Ambulatory Visit (INDEPENDENT_AMBULATORY_CARE_PROVIDER_SITE_OTHER): Payer: Medicare Other | Admitting: Cardiology

## 2015-04-22 ENCOUNTER — Encounter: Payer: Self-pay | Admitting: Cardiology

## 2015-04-22 VITALS — BP 144/83 | HR 61 | Ht 63.0 in | Wt 207.8 lb

## 2015-04-22 DIAGNOSIS — I10 Essential (primary) hypertension: Secondary | ICD-10-CM

## 2015-04-22 DIAGNOSIS — I2510 Atherosclerotic heart disease of native coronary artery without angina pectoris: Secondary | ICD-10-CM | POA: Diagnosis not present

## 2015-04-22 DIAGNOSIS — I484 Atypical atrial flutter: Secondary | ICD-10-CM | POA: Diagnosis not present

## 2015-04-22 DIAGNOSIS — Z7189 Other specified counseling: Secondary | ICD-10-CM

## 2015-04-22 DIAGNOSIS — I4891 Unspecified atrial fibrillation: Secondary | ICD-10-CM | POA: Diagnosis not present

## 2015-04-22 NOTE — Patient Instructions (Signed)
Your physician has recommended you make the following change in your medication:  Stop aspirin. Continue all other medications the same. Your physician recommends that you return for lab work in: 3 months just before your next visit in May 2017 to check your BMET and CBC. Your physician recommends that you schedule a follow-up appointment in: 3 months.

## 2015-04-22 NOTE — Progress Notes (Signed)
Cardiology Office Note  Date: 04/22/2015   ID: ELIANI REYNAUD, DOB 06-Jan-1946, MRN [id number]  PCP: [name], MD  Primary Cardiologist: Rozann Lesches, MD   Chief Complaint  Patient presents with  . Coronary Artery Disease  . Atrial Fibrillation    History of Present Illness: [name] Fry is a 70 y.o. female seen recently in the office by Dr. Bronson Ing in late January with newly documented atrial fibrillation with RVR and CHADSVASC score of 4.Toprol-XL dose was increased to 50 mg daily and she was started on Xarelto 20 mg daily. Office follow-up was arranged with me.  She is here with her daughter today. She states that she feels better, not entirely certain when her heart rate came back down, but she is in sinus rhythm today. She reports tolerating recent addition of Xarelto and the increase in Toprol-XL.  I reviewed her tracing from 04/11/2015 which shows a probable atypical atrial flutter versus ectopic atrial tachycardia with variable conduction. Today's tracing shows sinus bradycardia.   Today we discussed rationale for continuing anticoagulation for stroke prophylaxis. Also plan to stop aspirin altogether at this time to reduce her risk of bleeding.  Past Medical History  Diagnosis Date  . Hypothyroidism   . Hyperlipidemia   . GERD (gastroesophageal reflux disease)   . Coronary atherosclerosis of native coronary artery     DES OM 3/12  . Essential hypertension, benign   . H/O hiatal hernia   . Arthritis   . Cataracts, bilateral   . Breast cancer ([place])     Left  . Atrial fibrillation [facility])     Documented January 2017    Current Outpatient Prescriptions  Medication Sig Dispense Refill  . amLODipine (NORVASC) 2.5 MG tablet Take 1 tablet (2.5 mg total) by mouth daily. 90 tablet 3  . levothyroxine (SYNTHROID, LEVOTHROID) 112 MCG tablet Take 112 mcg by mouth daily.     . metoprolol succinate (TOPROL-XL) 50 MG 24 hr tablet Take 1 tablet (50 mg total) by mouth daily. 30  tablet 6  . Multiple Vitamins-Minerals (ECHINACEA ACZ PO) Take 1 tablet by mouth daily. Reported on 04/11/2015    . Multiple Vitamins-Minerals (OCUVITE PRESERVISION PO) Take 2 tablets by mouth daily.    . nitroGLYCERIN (NITROSTAT) 0.4 MG SL tablet Place 1 tablet (0.4 mg total) under the tongue every 5 (five) minutes x 3 doses as needed for chest pain. 25 tablet 3  . Omega-3 Fatty Acids (FISH OIL) 1000 MG CAPS Take 1 capsule (1,000 mg total) by mouth 2 (two) times daily.    . rivaroxaban (XARELTO) 20 MG TABS tablet Take 1 tablet (20 mg total) by mouth daily with supper. 30 tablet 6  . Turmeric 500 MG CAPS Take 500 mg by mouth daily. Reported on 04/11/2015    . vitamin B-12 (CYANOCOBALAMIN) 250 MCG tablet Take 500 mcg by mouth daily.     No current facility-administered medications for this visit.   Allergies:  Doxycycline   Social History: The patient  reports that she has never smoked. She has never used smokeless tobacco. She reports that she does not drink alcohol or use illicit drugs.   ROS:  Please see the history of present illness. Otherwise, complete review of systems is positive for none.  All other systems are reviewed and negative.   Physical Exam: VS:  BP 144/83 mmHg  Pulse 61  Ht 5\' 3"  (1.6 m)  Wt 207 lb 12.8 oz (94.257 kg)  BMI 36.82 kg/m2  SpO2 98%  LMP 09/28/2012, BMI Body mass index is 36.82 kg/(m^2).  Wt Readings from Last 3 Encounters:  04/22/15 207 lb 12.8 oz (94.257 kg)  04/11/15 206 lb (93.441 kg)  09/28/14 206 lb (93.441 kg)    Comfortable at rest.  HEENT: Conjunctiva and lids normal, oropharynx with moist mucosa.  Neck: Supple, no elevated JVP or bruits.  Lungs: Clear to auscultation, nonlabored.  Cardiac: Regular rate and rhythm, no S3.  Abdomen: Soft, nontender, bowel sounds present.  Skin: Warm and dry.  Extremities: No pitting edema.   ECG: ECG is ordered today.  Recent Labwork:  January 2017: Hemoglobin 14.3, platelets 297, BUN 17,  creatinine 0.8, potassium 4.2  Other Studies Reviewed Today:  Lexiscan Cardiolite 06/01/2014: FINDINGS: Stress/ECG data: The patient was stressed according to the Lexiscan protocol. The heart rate ranged from 65-88 beats per min. The blood pressure averaged 127/75.  Perfusion: There is a small area of mild to moderately decreased uptake in the inferoseptal wall extending from the mid left ventricle to the base with partial reversibility. This is suggestive of a mild degree of ischemia. However, variable soft tissue attenuation cannot entirely be ruled out.  Wall Motion: Inferoseptal hypokinesis. No left ventricular dilation.  Left Ventricular Ejection Fraction: 64 %  End diastolic volume 64 ml  End systolic volume 23 ml  IMPRESSION: 1. Probable mild degree of ischemia in inferoseptal wall.  2. Inferoseptal hypokinesis.  3. Left ventricular ejection fraction 64%  4. Low-risk stress test findings*.  Assessment and Plan:  1. Recently documented atypical atrial flutter, spontaneously converted to sinus rhythm. Plan to continue current dose of Toprol-XL as well as Xarelto. We will stop aspirin altogether. Follow-up arranged in 3 months with CBC and BMET. I did review her recent lab work.  2. Symptomatically stable CAD status post DES to the OM in 2012. No active angina symptoms.  Current medicines were reviewed with the patient today.   Orders Placed This Encounter  Procedures  . Basic metabolic panel  . CBC  . EKG 12-Lead    Disposition: FU with me in 3 months.   Signed, Satira Sark, MD, [facility] 04/22/2015 4:54 PM    [facility] [place], [place], [place] [address] Phone: [phone]; Fax: [phone]

## 2015-05-09 ENCOUNTER — Ambulatory Visit (INDEPENDENT_AMBULATORY_CARE_PROVIDER_SITE_OTHER): Payer: Medicare Other | Admitting: *Deleted

## 2015-05-09 DIAGNOSIS — I480 Paroxysmal atrial fibrillation: Secondary | ICD-10-CM

## 2015-05-09 DIAGNOSIS — Z5181 Encounter for therapeutic drug level monitoring: Secondary | ICD-10-CM | POA: Diagnosis not present

## 2015-05-09 DIAGNOSIS — Z79899 Other long term (current) drug therapy: Secondary | ICD-10-CM | POA: Diagnosis not present

## 2015-05-09 NOTE — Progress Notes (Signed)
Pt was started on Xarelto 20mg  qd for atrial fib on 04/11/15.    Reviewed patients medication list.  Pt is not currently on any combined P-gp and strong CYP3A4 inhibitors/inducers (ketoconazole, traconazole, ritonavir, carbamazepine, phenytoin, rifampin, St. John's wort).  Reviewed labs from 05/09/15 @[facility] .  SCr 0.66  Weight  94.3 kg  CrCl  118.07.  Dose is apptopriate based on CrCl.   Hgb and HCT: 12.4/37.5  A full discussion of the nature of anticoagulants has been carried out.  A benefit/risk analysis has been presented to the patient, so that they understand the justification for choosing anticoagulation with Xarelto at this time.  The need for compliance is stressed.  Pt is aware to take the medication once daily with the largest meal of the day.  Side effects of potential bleeding are discussed, including unusual colored urine or stools, coughing up blood or coffee ground emesis, nose bleeds or serious fall or head trauma.  Discussed signs and symptoms of stroke. The patient should avoid any OTC items containing aspirin or ibuprofen.  Avoid alcohol consumption.   Call if any signs of abnormal bleeding.  Discussed financial obligations and resolved any difficulty in obtaining medication.  Next lab test test in 6 months.   Called pt with results.  Has appt in 3 months with Dr Domenic Polite for f/u and labs.  OK to cancel those labs per Dr Domenic Polite since labs were checked today.  Will see pt back in 6 months for Xarelto follow up.  Placed in recall.

## 2015-05-14 ENCOUNTER — Other Ambulatory Visit: Payer: Self-pay | Admitting: *Deleted

## 2015-05-14 NOTE — Progress Notes (Signed)
Pt is scheduled for appt with Dr Domenic Polite  07/23/15 for follow up and lab work.  Per Dr Domenic Polite it is OK to cancel labs in May since labs were just checked 05/09/15 for Xarelto appt.  Pt told.

## 2015-05-15 ENCOUNTER — Encounter: Payer: Self-pay | Admitting: *Deleted

## 2015-05-18 ENCOUNTER — Other Ambulatory Visit: Payer: Self-pay | Admitting: Cardiology

## 2015-05-23 ENCOUNTER — Telehealth: Payer: Self-pay | Admitting: Cardiology

## 2015-05-23 MED ORDER — RIVAROXABAN 20 MG PO TABS: 20.0000 mg | ORAL_TABLET | Freq: Every day | ORAL | Status: DC

## 2015-05-23 NOTE — Telephone Encounter (Signed)
Per patient, she is waiting for pharmacy to get approval to get xarelto. [facility] was told that prior authorization request was sent to our office today for xarelto. Samples provided for patient.

## 2015-05-23 NOTE — Telephone Encounter (Signed)
Patient requesting Xalretto samples

## 2015-05-30 NOTE — Telephone Encounter (Signed)
Patient was informed by Edrick Oh.

## 2015-07-23 ENCOUNTER — Other Ambulatory Visit: Payer: Self-pay | Admitting: Cardiology

## 2015-07-23 ENCOUNTER — Encounter: Payer: Self-pay | Admitting: Cardiology

## 2015-07-23 ENCOUNTER — Ambulatory Visit (INDEPENDENT_AMBULATORY_CARE_PROVIDER_SITE_OTHER): Payer: Medicare Other | Admitting: Cardiology

## 2015-07-23 ENCOUNTER — Encounter: Payer: Self-pay | Admitting: *Deleted

## 2015-07-23 ENCOUNTER — Telehealth: Payer: Self-pay | Admitting: Cardiology

## 2015-07-23 VITALS — BP 160/98 | HR 75 | Ht 63.0 in | Wt 210.0 lb

## 2015-07-23 DIAGNOSIS — E7800 Pure hypercholesterolemia, unspecified: Secondary | ICD-10-CM | POA: Diagnosis not present

## 2015-07-23 DIAGNOSIS — I484 Atypical atrial flutter: Secondary | ICD-10-CM | POA: Diagnosis not present

## 2015-07-23 DIAGNOSIS — I2510 Atherosclerotic heart disease of native coronary artery without angina pectoris: Secondary | ICD-10-CM

## 2015-07-23 DIAGNOSIS — I25110 Atherosclerotic heart disease of native coronary artery with unstable angina pectoris: Secondary | ICD-10-CM

## 2015-07-23 DIAGNOSIS — I10 Essential (primary) hypertension: Secondary | ICD-10-CM

## 2015-07-23 DIAGNOSIS — E782 Mixed hyperlipidemia: Secondary | ICD-10-CM

## 2015-07-23 DIAGNOSIS — R0609 Other forms of dyspnea: Secondary | ICD-10-CM

## 2015-07-23 DIAGNOSIS — D519 Vitamin B12 deficiency anemia, unspecified: Secondary | ICD-10-CM | POA: Diagnosis not present

## 2015-07-23 DIAGNOSIS — E559 Vitamin D deficiency, unspecified: Secondary | ICD-10-CM | POA: Diagnosis not present

## 2015-07-23 DIAGNOSIS — Z789 Other specified health status: Secondary | ICD-10-CM

## 2015-07-23 DIAGNOSIS — Z889 Allergy status to unspecified drugs, medicaments and biological substances status: Secondary | ICD-10-CM

## 2015-07-23 NOTE — Telephone Encounter (Signed)
Left heart cath holding xarelto 2 days prior dx: CAD w/unspecified angina & progressive SOB scheduled for Friday, Jul 26, 2015 @10 :30 am with Dr. Burt Knack arrive at 8:30 am \\checking  percert

## 2015-07-23 NOTE — Progress Notes (Signed)
Cardiology Office Note  Date: 07/23/2015   ID: [name] Fry, DOB [birth year], MRN [id number]  PCP: [name], MD  Primary Cardiologist: Rozann Lesches, MD   Chief Complaint  Patient presents with  . Atypical atrial flutter  . Coronary Artery Disease    History of Present Illness: [name] Fry is a 70 y.o. female last seen in February. She presents for a routine follow-up visit. Over the last few weeks she reports having advancing fatigue and dyspnea on exertion, no definite sense of palpitations however. She reports compliance with her medications. She states that she feels very much like she did prior to undergoing coronary intervention back in 2012.  She is undergoing a follow-up Cardiolite study last year which was mildly abnormal as outlined below. We have been managing her medically. Current regimen includes Toprol-XL, Norvasc, Xarelto, omega-3 supplements, and as needed nitroglycerin.  I reviewed her ECG today which shows sinus rhythm with bursts of atrial activity, left anterior fascicular block/IVCD.  Today we discussed options for follow-up testing including both noninvasive and invasive techniques. She is in agreement to proceed with a diagnostic cardiac catheterization for reevaluation of coronary anatomy.  Past Medical History  Diagnosis Date  . Hypothyroidism   . Hyperlipidemia   . GERD (gastroesophageal reflux disease)   . Coronary atherosclerosis of native coronary artery     DES OM 3/12  . Essential hypertension, benign   . H/O hiatal hernia   . Arthritis   . Cataracts, bilateral   . Breast cancer ([place])     Left  . Atrial fibrillation [facility])     Documented January 2017    Past Surgical History  Procedure Laterality Date  . Mastectomy Left     1997; S/P chemotherapy/radiation  . Cholecystectomy    . Appendectomy    . Tubal ligation    . 25 gauge pars plana vitrectomy with 20 gauge mvr port Right 09/29/2012    Procedure: 25 GAUGE PARS PLANA VITRECTOMY  WITH 20 GAUGE MVR PORT;  Surgeon: Hayden Pedro, MD;  Location: [facility];  Service: Ophthalmology;  Laterality: Right;  . Laser photo ablation Right 09/29/2012    Procedure: LASER PHOTO ABLATION;  Surgeon: Hayden Pedro, MD;  Location: [facility];  Service: Ophthalmology;  Laterality: Right;  Headscope laser  . Membrane peel Right 09/29/2012    Procedure: MEMBRANE PEEL;  Surgeon: Hayden Pedro, MD;  Location: [facility];  Service: Ophthalmology;  Laterality: Right;  . Gas/fluid exchange Right 09/29/2012    Procedure: GAS/FLUID EXCHANGE;  Surgeon: Hayden Pedro, MD;  Location: [facility];  Service: Ophthalmology;  Laterality: Right;  . Cataract extraction w/phaco Right 05/18/2013    Procedure: CATARACT EXTRACTION PHACO AND INTRAOCULAR LENS PLACEMENT RIGHT EYE;  Surgeon: Tonny Branch, MD;  Location: [facility];  Service: Ophthalmology;  Laterality: Right;  CDE:13.35    Current Outpatient Prescriptions  Medication Sig Dispense Refill  . amLODipine (NORVASC) 2.5 MG tablet TAKE 1 TABLET BY MOUTH EVERY DAY 90 tablet 3  . levothyroxine (SYNTHROID, LEVOTHROID) 112 MCG tablet Take 112 mcg by mouth daily.     . metoprolol succinate (TOPROL-XL) 50 MG 24 hr tablet Take 1 tablet (50 mg total) by mouth daily. 30 tablet 6  . Multiple Vitamins-Minerals (ECHINACEA ACZ PO) Take 1 tablet by mouth daily. Reported on 04/11/2015    . Multiple Vitamins-Minerals (OCUVITE PRESERVISION PO) Take 2 tablets by mouth daily.    . nitroGLYCERIN (NITROSTAT) 0.4 MG SL tablet Place 1 tablet (  0.4 mg total) under the tongue every 5 (five) minutes x 3 doses as needed for chest pain. 25 tablet 3  . Omega-3 Fatty Acids (FISH OIL) 1000 MG CAPS Take 1 capsule (1,000 mg total) by mouth 2 (two) times daily.    . rivaroxaban (XARELTO) 20 MG TABS tablet Take 1 tablet (20 mg total) by mouth daily with supper. 28 tablet 0  . Turmeric 500 MG CAPS Take 500 mg by mouth daily. Reported on 04/11/2015    . vitamin B-12 (CYANOCOBALAMIN) 250 MCG tablet Take 500 mcg by  mouth daily.     No current facility-administered medications for this visit.   Allergies:  Doxycycline   Social History: The patient  reports that she has never smoked. She has never used smokeless tobacco. She reports that she does not drink alcohol or use illicit drugs.   Family History: The patient's family history includes Heart attack (age of onset: 28) in her father.   ROS:  Please see the history of present illness. Otherwise, complete review of systems is positive for fatigue.  All other systems are reviewed and negative.   Physical Exam: VS:  BP 160/98 mmHg  Pulse 75  Ht 5\' 3"  (1.6 m)  Wt 210 lb (95.255 kg)  BMI 37.21 kg/m2  SpO2 98%  LMP 09/28/2012, BMI Body mass index is 37.21 kg/(m^2).  Wt Readings from Last 3 Encounters:  07/23/15 210 lb (95.255 kg)  04/22/15 207 lb 12.8 oz (94.257 kg)  04/11/15 206 lb (93.441 kg)    Overweight woman, no distress.Marland Kitchen  HEENT: Conjunctiva and lids normal, oropharynx with moist mucosa.  Neck: Supple, no elevated JVP or bruits.  Lungs: Clear to auscultation, nonlabored.  Cardiac: Regular rate and rhythm, no S3.  Abdomen: Soft, nontender, bowel sounds present.  Skin: Warm and dry.  Extremities: No pitting edema. Musculoskeletal: No kyphosis. Neuropsychiatric: Alert and oriented 3, affect appropriate.  ECG: I personally reviewed the prior tracing from 04/22/2015 which showed sinus bradycardia.  Other Studies Reviewed Today:  Lexiscan Cardiolite 06/01/2014: FINDINGS: Stress/ECG data: The patient was stressed according to the Lexiscan protocol. The heart rate ranged from 65-88 beats per min. The blood pressure averaged 127/75.  Perfusion: There is a small area of mild to moderately decreased uptake in the inferoseptal wall extending from the mid left ventricle to the base with partial reversibility. This is suggestive of a mild degree of ischemia. However, variable soft tissue attenuation cannot entirely be ruled  out.  Wall Motion: Inferoseptal hypokinesis. No left ventricular dilation.  Left Ventricular Ejection Fraction: 64 %  End diastolic volume 64 ml  End systolic volume 23 ml  IMPRESSION: 1. Probable mild degree of ischemia in inferoseptal wall.  2. Inferoseptal hypokinesis.  3. Left ventricular ejection fraction 64%  4. Low-risk stress test findings*.  Assessment and Plan:  1. Recently progressive exertional fatigue and shortness of breath reminiscent of prior anginal equivalent. She underwent DES intervention to the obtuse marginal in 2012. Noninvasive imaging from last year was overall low risk with mild ischemic zone that we have been managing medically. In light of progressive symptoms, we discussed proceeding with a diagnostic cardiac catheterization, she is in agreement to proceed after reviewing the risks and benefits. We will need to hold Xarelto for 48 hours prior.  2. CAD status post DES to the obtuse marginal in 2012. No longer on aspirin since started on Xarelto earlier this year.  3. Atypical atrial flutter, maintaining sinus rhythm. She is on Xarelto with CHADSVASC  score of 4.  4. Essential hypertension, continues on Toprol-XL and Norvasc.  5. Hyperlipidemia with history of statin intolerance.  Current medicines were reviewed with the patient today.   Orders Placed This Encounter  Procedures  . EKG 12-Lead    Disposition: FU with me after cardiac catheterization.   Signed, Satira Sark, MD, [facility] 07/23/2015 4:35 PM    [facility] at [place], [place], [place] [address] Phone: [phone]; Fax: [phone]

## 2015-07-23 NOTE — Patient Instructions (Signed)
Your physician recommends that you continue on your current medications as directed. Please refer to the Current Medication list given to you today. Your physician has requested that you have a cardiac catheterization. Cardiac catheterization is used to diagnose and/or treat various heart conditions. Doctors may recommend this procedure for a number of different reasons. The most common reason is to evaluate chest pain. Chest pain can be a symptom of coronary artery disease (CAD), and cardiac catheterization can show whether plaque is narrowing or blocking your heart's arteries. This procedure is also used to evaluate the valves, as well as measure the blood flow and oxygen levels in different parts of your heart. For further information please visit www.cardiosmart.org. Please follow instruction sheet, as given. Your physician recommends that you schedule a follow-up appointment in: 1 month. 

## 2015-07-24 NOTE — Telephone Encounter (Signed)
This patient has [facility] no precert rqd at this time

## 2015-07-25 DIAGNOSIS — C50912 Malignant neoplasm of unspecified site of left female breast: Secondary | ICD-10-CM | POA: Diagnosis not present

## 2015-07-26 ENCOUNTER — Ambulatory Visit (HOSPITAL_COMMUNITY)
Admission: RE | Admit: 2015-07-26 | Discharge: 2015-07-26 | Disposition: A | Discharge status: Home / Self Care | Payer: Medicare Other | Source: Ambulatory Visit | Attending: Cardiovascular Disease | Admitting: Cardiovascular Disease

## 2015-07-26 ENCOUNTER — Encounter (HOSPITAL_COMMUNITY): Payer: Self-pay | Admitting: *Deleted

## 2015-07-26 ENCOUNTER — Encounter (HOSPITAL_COMMUNITY)
Admission: RE | Disposition: A | Discharge status: Home / Self Care | Payer: Self-pay | Source: Ambulatory Visit | Attending: Cardiovascular Disease

## 2015-07-26 DIAGNOSIS — E039 Hypothyroidism, unspecified: Secondary | ICD-10-CM | POA: Diagnosis not present

## 2015-07-26 DIAGNOSIS — I10 Essential (primary) hypertension: Secondary | ICD-10-CM | POA: Insufficient documentation

## 2015-07-26 DIAGNOSIS — Z955 Presence of coronary angioplasty implant and graft: Secondary | ICD-10-CM | POA: Diagnosis not present

## 2015-07-26 DIAGNOSIS — E785 Hyperlipidemia, unspecified: Secondary | ICD-10-CM | POA: Insufficient documentation

## 2015-07-26 DIAGNOSIS — K219 Gastro-esophageal reflux disease without esophagitis: Secondary | ICD-10-CM | POA: Insufficient documentation

## 2015-07-26 DIAGNOSIS — Z8249 Family history of ischemic heart disease and other diseases of the circulatory system: Secondary | ICD-10-CM | POA: Diagnosis not present

## 2015-07-26 DIAGNOSIS — Z853 Personal history of malignant neoplasm of breast: Secondary | ICD-10-CM | POA: Insufficient documentation

## 2015-07-26 DIAGNOSIS — Z7901 Long term (current) use of anticoagulants: Secondary | ICD-10-CM | POA: Insufficient documentation

## 2015-07-26 DIAGNOSIS — I2510 Atherosclerotic heart disease of native coronary artery without angina pectoris: Secondary | ICD-10-CM | POA: Diagnosis not present

## 2015-07-26 DIAGNOSIS — I4891 Unspecified atrial fibrillation: Secondary | ICD-10-CM | POA: Diagnosis present

## 2015-07-26 DIAGNOSIS — I480 Paroxysmal atrial fibrillation: Secondary | ICD-10-CM | POA: Insufficient documentation

## 2015-07-26 DIAGNOSIS — M1990 Unspecified osteoarthritis, unspecified site: Secondary | ICD-10-CM | POA: Diagnosis not present

## 2015-07-26 DIAGNOSIS — I25110 Atherosclerotic heart disease of native coronary artery with unstable angina pectoris: Secondary | ICD-10-CM

## 2015-07-26 DIAGNOSIS — I484 Atypical atrial flutter: Secondary | ICD-10-CM | POA: Diagnosis not present

## 2015-07-26 DIAGNOSIS — R0609 Other forms of dyspnea: Secondary | ICD-10-CM | POA: Insufficient documentation

## 2015-07-26 HISTORY — PX: CARDIAC CATHETERIZATION: SHX172

## 2015-07-26 LAB — CBC
HEMATOCRIT: 38.7 % (ref 36.0–46.0)
HEMOGLOBIN: 12.7 g/dL (ref 12.0–15.0)
MCH: 27.5 pg (ref 26.0–34.0)
MCHC: 32.8 g/dL (ref 30.0–36.0)
MCV: 83.8 fL (ref 78.0–100.0)
Platelets: 264 10*3/uL (ref 150–400)
RBC: 4.62 MIL/uL (ref 3.87–5.11)
RDW: 13.2 % (ref 11.5–15.5)
WBC: 6.8 10*3/uL (ref 4.0–10.5)

## 2015-07-26 LAB — BASIC METABOLIC PANEL
ANION GAP: 14 (ref 5–15)
BUN: 9 mg/dL (ref 6–20)
CALCIUM: 9.7 mg/dL (ref 8.9–10.3)
CO2: 21 mmol/L — AB (ref 22–32)
Chloride: 107 mmol/L (ref 101–111)
Creatinine, Ser: 0.8 mg/dL (ref 0.44–1.00)
GFR calc Af Amer: >60 mL/min (ref >60)
GFR calc non Af Amer: >60 mL/min (ref >60)
GLUCOSE: 106 mg/dL — AB (ref 65–99)
POTASSIUM: 3.9 mmol/L (ref 3.5–5.1)
Sodium: 142 mmol/L (ref 135–145)

## 2015-07-26 LAB — PROTIME-INR
INR: 1.09 (ref 0.00–1.49)
Prothrombin Time: 14.3 seconds (ref 11.6–15.2)

## 2015-07-26 SURGERY — LEFT HEART CATH AND CORONARY ANGIOGRAPHY

## 2015-07-26 MED ORDER — SODIUM CHLORIDE 0.9 % IV SOLN: 250.0000 mL | INTRAVENOUS | Status: DC | PRN

## 2015-07-26 MED ORDER — LIDOCAINE HCL (PF) 1 % IJ SOLN
INTRAMUSCULAR | Status: AC
  Filled 2015-07-26: qty 30

## 2015-07-26 MED ORDER — SODIUM CHLORIDE 0.9% FLUSH: 3.0000 mL | INTRAVENOUS | Status: DC | PRN

## 2015-07-26 MED ORDER — MIDAZOLAM HCL 2 MG/2ML IJ SOLN
INTRAMUSCULAR | Status: AC
  Filled 2015-07-26: qty 2

## 2015-07-26 MED ORDER — ASPIRIN 81 MG PO CHEW
CHEWABLE_TABLET | ORAL | Status: AC
  Filled 2015-07-26: qty 1

## 2015-07-26 MED ORDER — VERAPAMIL HCL 2.5 MG/ML IV SOLN
INTRAVENOUS | Status: AC
  Filled 2015-07-26: qty 2

## 2015-07-26 MED ORDER — IOPAMIDOL (ISOVUE-370) INJECTION 76%
INTRAVENOUS | Status: AC
  Filled 2015-07-26: qty 100

## 2015-07-26 MED ORDER — IOPAMIDOL (ISOVUE-370) INJECTION 76%
INTRAVENOUS | Status: DC | PRN
  Administered 2015-07-26: 60 mL via INTRAVENOUS

## 2015-07-26 MED ORDER — METOPROLOL SUCCINATE ER 50 MG PO TB24
50.0000 mg | ORAL_TABLET | Freq: Once | ORAL | Status: AC
  Administered 2015-07-26: 50 mg via ORAL
  Filled 2015-07-26: qty 1

## 2015-07-26 MED ORDER — SODIUM CHLORIDE 0.9 % IV SOLN
INTRAVENOUS | Status: DC
  Administered 2015-07-26: 10:00:00 via INTRAVENOUS

## 2015-07-26 MED ORDER — HEPARIN (PORCINE) IN NACL 2-0.9 UNIT/ML-% IJ SOLN
INTRAMUSCULAR | Status: DC | PRN
  Administered 2015-07-26: 1500 mL

## 2015-07-26 MED ORDER — SODIUM CHLORIDE 0.9% FLUSH: 3.0000 mL | Freq: Two times a day (BID) | INTRAVENOUS | Status: DC

## 2015-07-26 MED ORDER — METOPROLOL SUCCINATE ER 50 MG PO TB24: 50.0000 mg | ORAL_TABLET | Freq: Once | ORAL | Status: DC

## 2015-07-26 MED ORDER — SODIUM CHLORIDE 0.9 % IV SOLN: INTRAVENOUS | Status: DC

## 2015-07-26 MED ORDER — FENTANYL CITRATE (PF) 100 MCG/2ML IJ SOLN
INTRAMUSCULAR | Status: DC | PRN
  Administered 2015-07-26: 25 ug via INTRAVENOUS

## 2015-07-26 MED ORDER — LIDOCAINE HCL (PF) 1 % IJ SOLN
INTRAMUSCULAR | Status: DC | PRN
  Administered 2015-07-26: 5 mL

## 2015-07-26 MED ORDER — ACETAMINOPHEN 325 MG PO TABS: 650.0000 mg | ORAL_TABLET | ORAL | Status: DC | PRN

## 2015-07-26 MED ORDER — METOPROLOL SUCCINATE ER 50 MG PO TB24: 50.0000 mg | ORAL_TABLET | Freq: Two times a day (BID) | ORAL | Status: DC

## 2015-07-26 MED ORDER — HEPARIN SODIUM (PORCINE) 1000 UNIT/ML IJ SOLN
INTRAMUSCULAR | Status: AC
  Filled 2015-07-26: qty 1

## 2015-07-26 MED ORDER — METOPROLOL TARTRATE 5 MG/5ML IV SOLN
INTRAVENOUS | Status: AC
  Filled 2015-07-26: qty 5

## 2015-07-26 MED ORDER — METOPROLOL TARTRATE 5 MG/5ML IV SOLN
INTRAVENOUS | Status: DC | PRN
  Administered 2015-07-26 (×2): 5 mg via INTRAVENOUS

## 2015-07-26 MED ORDER — VERAPAMIL HCL 2.5 MG/ML IV SOLN
INTRAVENOUS | Status: DC | PRN
  Administered 2015-07-26: 12:00:00 via INTRA_ARTERIAL

## 2015-07-26 MED ORDER — HEPARIN (PORCINE) IN NACL 2-0.9 UNIT/ML-% IJ SOLN
INTRAMUSCULAR | Status: AC
  Filled 2015-07-26: qty 500

## 2015-07-26 MED ORDER — FENTANYL CITRATE (PF) 100 MCG/2ML IJ SOLN
INTRAMUSCULAR | Status: AC
  Filled 2015-07-26: qty 2

## 2015-07-26 MED ORDER — ONDANSETRON HCL 4 MG/2ML IJ SOLN: 4.0000 mg | Freq: Four times a day (QID) | INTRAMUSCULAR | Status: DC | PRN

## 2015-07-26 MED ORDER — HEPARIN SODIUM (PORCINE) 1000 UNIT/ML IJ SOLN
INTRAMUSCULAR | Status: DC | PRN
  Administered 2015-07-26: 4000 [IU] via INTRAVENOUS

## 2015-07-26 MED ORDER — MIDAZOLAM HCL 2 MG/2ML IJ SOLN
INTRAMUSCULAR | Status: DC | PRN
  Administered 2015-07-26: 2 mg via INTRAVENOUS

## 2015-07-26 MED ORDER — HEPARIN (PORCINE) IN NACL 2-0.9 UNIT/ML-% IJ SOLN
INTRAMUSCULAR | Status: AC
  Filled 2015-07-26: qty 1000

## 2015-07-26 MED ORDER — ASPIRIN 81 MG PO CHEW
81.0000 mg | CHEWABLE_TABLET | ORAL | Status: AC
  Administered 2015-07-26: 81 mg via ORAL

## 2015-07-26 MED ORDER — METOPROLOL TARTRATE 5 MG/5ML IV SOLN
5.0000 mg | INTRAVENOUS | Status: DC | PRN
Start: 2015-07-26 — End: 2015-07-26

## 2015-07-26 MED ORDER — METOPROLOL TARTRATE 5 MG/5ML IV SOLN
5.0000 mg | Freq: Once | INTRAVENOUS | Status: AC
  Administered 2015-07-26: 5 mg via INTRAVENOUS

## 2015-07-26 SURGICAL SUPPLY — 12 items
CATH INFINITI 5 FR JL3.5 (CATHETERS) ×2 IMPLANT
CATH INFINITI 5FR ANG PIGTAIL (CATHETERS) ×2 IMPLANT
CATH INFINITI JR4 5F (CATHETERS) ×2 IMPLANT
DEVICE RAD COMP TR BAND LRG (VASCULAR PRODUCTS) ×2 IMPLANT
GLIDESHEATH SLEND SS 6F .021 (SHEATH) ×2 IMPLANT
KIT HEART LEFT (KITS) ×3 IMPLANT
PACK CARDIAC CATHETERIZATION (CUSTOM PROCEDURE TRAY) ×3 IMPLANT
SYR MEDRAD MARK V 150ML (SYRINGE) ×3 IMPLANT
TRANSDUCER W/STOPCOCK (MISCELLANEOUS) ×3 IMPLANT
TUBING CIL FLEX 10 FLL-RA (TUBING) ×3 IMPLANT
WIRE HI TORQ VERSACORE-J 145CM (WIRE) ×2 IMPLANT
WIRE SAFE-T 1.5MM-J .035X260CM (WIRE) ×2 IMPLANT

## 2015-07-26 NOTE — Discharge Instructions (Signed)
Radial Site Care °Refer to this sheet in the next few weeks. These instructions provide you with information about caring for yourself after your procedure. Your health care provider may also give you more specific instructions. Your treatment has been planned according to current medical practices, but problems sometimes occur. Call your health care provider if you have any problems or questions after your procedure. °WHAT TO EXPECT AFTER THE PROCEDURE °After your procedure, it is typical to have the following: °· Bruising at the radial site that usually fades within 1-2 weeks. °· Blood collecting in the tissue (hematoma) that may be painful to the touch. It should usually decrease in size and tenderness within 1-2 weeks. °HOME CARE INSTRUCTIONS °· Take medicines only as directed by your health care provider. °· You may shower 24-48 hours after the procedure or as directed by your health care provider. Remove the bandage (dressing) and gently wash the site with plain soap and water. Pat the area dry with a clean towel. Do not rub the site, because this may cause bleeding. °· Do not take baths, swim, or use a hot tub until your health care provider approves. °· Check your insertion site every day for redness, swelling, or drainage. °· Do not apply powder or lotion to the site. °· Do not flex or bend the affected arm for 24 hours or as directed by your health care provider. °· Do not push or pull heavy objects with the affected arm for 24 hours or as directed by your health care provider. °· Do not lift over 10 lb (4.5 kg) for 5 days after your procedure or as directed by your health care provider. °· Ask your health care provider when it is okay to: °¨ Return to work or school. °¨ Resume usual physical activities or sports. °¨ Resume sexual activity. °· Do not drive home if you are discharged the same day as the procedure. Have someone else drive you. °· You may drive 24 hours after the procedure unless otherwise  instructed by your health care provider. °· Do not operate machinery or power tools for 24 hours after the procedure. °· If your procedure was done as an outpatient procedure, which means that you went home the same day as your procedure, a responsible adult should be with you for the first 24 hours after you arrive home. °· Keep all follow-up visits as directed by your health care provider. This is important. °SEEK MEDICAL CARE IF: °· You have a fever. °· You have chills. °· You have increased bleeding from the radial site. Hold pressure on the site. °SEEK IMMEDIATE MEDICAL CARE IF: °· You have unusual pain at the radial site. °· You have redness, warmth, or swelling at the radial site. °· You have drainage (other than a small amount of blood on the dressing) from the radial site. °· The radial site is bleeding, and the bleeding does not stop after 30 minutes of holding steady pressure on the site. °· Your arm or hand becomes pale, cool, tingly, or numb. °  °This information is not intended to replace advice given to you by your health care provider. Make sure you discuss any questions you have with your health care provider. °  °Document Released: 04/04/2010 Document Revised: 03/23/2014 Document Reviewed: 09/18/2013 °Elsevier Interactive Patient Education ©2016 [employer] ° °

## 2015-07-26 NOTE — H&P (View-Only) (Signed)
Cardiology Office Note  Date: 07/23/2015   ID: AVITAL MANZI, DOB Mar 13, 1946, MRN [id number]  PCP: [name], MD  Primary Cardiologist: Rozann Lesches, MD   Chief Complaint  Patient presents with  . Atypical atrial flutter  . Coronary Artery Disease    History of Present Illness: [name] Fry is a 70 y.o. female last seen in February. She presents for a routine follow-up visit. Over the last few weeks she reports having advancing fatigue and dyspnea on exertion, no definite sense of palpitations however. She reports compliance with her medications. She states that she feels very much like she did prior to undergoing coronary intervention back in 2012.  She is undergoing a follow-up Cardiolite study last year which was mildly abnormal as outlined below. We have been managing her medically. Current regimen includes Toprol-XL, Norvasc, Xarelto, omega-3 supplements, and as needed nitroglycerin.  I reviewed her ECG today which shows sinus rhythm with bursts of atrial activity, left anterior fascicular block/IVCD.  Today we discussed options for follow-up testing including both noninvasive and invasive techniques. She is in agreement to proceed with a diagnostic cardiac catheterization for reevaluation of coronary anatomy.  Past Medical History  Diagnosis Date  . Hypothyroidism   . Hyperlipidemia   . GERD (gastroesophageal reflux disease)   . Coronary atherosclerosis of native coronary artery     DES OM 3/12  . Essential hypertension, benign   . H/O hiatal hernia   . Arthritis   . Cataracts, bilateral   . Breast cancer ([place])     Left  . Atrial fibrillation [facility])     Documented January 2017    Past Surgical History  Procedure Laterality Date  . Mastectomy Left     1997; S/P chemotherapy/radiation  . Cholecystectomy    . Appendectomy    . Tubal ligation    . 25 gauge pars plana vitrectomy with 20 gauge mvr port Right 09/29/2012    Procedure: 25 GAUGE PARS PLANA VITRECTOMY  WITH 20 GAUGE MVR PORT;  Surgeon: Hayden Pedro, MD;  Location: [place];  Service: Ophthalmology;  Laterality: Right;  . Laser photo ablation Right 09/29/2012    Procedure: LASER PHOTO ABLATION;  Surgeon: Hayden Pedro, MD;  Location: [place];  Service: Ophthalmology;  Laterality: Right;  Headscope laser  . Membrane peel Right 09/29/2012    Procedure: MEMBRANE PEEL;  Surgeon: Hayden Pedro, MD;  Location: [place];  Service: Ophthalmology;  Laterality: Right;  . Gas/fluid exchange Right 09/29/2012    Procedure: GAS/FLUID EXCHANGE;  Surgeon: Hayden Pedro, MD;  Location: [place];  Service: Ophthalmology;  Laterality: Right;  . Cataract extraction w/phaco Right 05/18/2013    Procedure: CATARACT EXTRACTION PHACO AND INTRAOCULAR LENS PLACEMENT RIGHT EYE;  Surgeon: Tonny Branch, MD;  [facility];  Service: Ophthalmology;  Laterality: Right;  CDE:13.35    Current Outpatient Prescriptions  Medication Sig Dispense Refill  . amLODipine (NORVASC) 2.5 MG tablet TAKE 1 TABLET BY MOUTH EVERY DAY 90 tablet 3  . levothyroxine (SYNTHROID, LEVOTHROID) 112 MCG tablet Take 112 mcg by mouth daily.     . metoprolol succinate (TOPROL-XL) 50 MG 24 hr tablet Take 1 tablet (50 mg total) by mouth daily. 30 tablet 6  . Multiple Vitamins-Minerals (ECHINACEA ACZ PO) Take 1 tablet by mouth daily. Reported on 04/11/2015    . Multiple Vitamins-Minerals (OCUVITE PRESERVISION PO) Take 2 tablets by mouth daily.    . nitroGLYCERIN (NITROSTAT) 0.4 MG SL tablet Place 1 tablet (  0.4 mg total) under the tongue every 5 (five) minutes x 3 doses as needed for chest pain. 25 tablet 3  . Omega-3 Fatty Acids (FISH OIL) 1000 MG CAPS Take 1 capsule (1,000 mg total) by mouth 2 (two) times daily.    . rivaroxaban (XARELTO) 20 MG TABS tablet Take 1 tablet (20 mg total) by mouth daily with supper. 28 tablet 0  . Turmeric 500 MG CAPS Take 500 mg by mouth daily. Reported on 04/11/2015    . vitamin B-12 (CYANOCOBALAMIN) 250 MCG tablet Take 500 mcg by  mouth daily.     No current facility-administered medications for this visit.   Allergies:  Doxycycline   Social History: The patient  reports that she has never smoked. She has never used smokeless tobacco. She reports that she does not drink alcohol or use illicit drugs.   Family History: The patient's family history includes Heart attack (age of onset: 9) in her father.   ROS:  Please see the history of present illness. Otherwise, complete review of systems is positive for fatigue.  All other systems are reviewed and negative.   Physical Exam: VS:  BP 160/98 mmHg  Pulse 75  Ht 5\' 3"  (1.6 m)  Wt 210 lb (95.255 kg)  BMI 37.21 kg/m2  SpO2 98%  LMP 09/28/2012, BMI Body mass index is 37.21 kg/(m^2).  Wt Readings from Last 3 Encounters:  07/23/15 210 lb (95.255 kg)  04/22/15 207 lb 12.8 oz (94.257 kg)  04/11/15 206 lb (93.441 kg)    Overweight woman, no distress.Marland Kitchen  HEENT: Conjunctiva and lids normal, oropharynx with moist mucosa.  Neck: Supple, no elevated JVP or bruits.  Lungs: Clear to auscultation, nonlabored.  Cardiac: Regular rate and rhythm, no S3.  Abdomen: Soft, nontender, bowel sounds present.  Skin: Warm and dry.  Extremities: No pitting edema. Musculoskeletal: No kyphosis. Neuropsychiatric: Alert and oriented 3, affect appropriate.  ECG: I personally reviewed the prior tracing from 04/22/2015 which showed sinus bradycardia.  Other Studies Reviewed Today:  Lexiscan Cardiolite 06/01/2014: FINDINGS: Stress/ECG data: The patient was stressed according to the Lexiscan protocol. The heart rate ranged from 65-88 beats per min. The blood pressure averaged 127/75.  Perfusion: There is a small area of mild to moderately decreased uptake in the inferoseptal wall extending from the mid left ventricle to the base with partial reversibility. This is suggestive of a mild degree of ischemia. However, variable soft tissue attenuation cannot entirely be ruled  out.  Wall Motion: Inferoseptal hypokinesis. No left ventricular dilation.  Left Ventricular Ejection Fraction: 64 %  End diastolic volume 64 ml  End systolic volume 23 ml  IMPRESSION: 1. Probable mild degree of ischemia in inferoseptal wall.  2. Inferoseptal hypokinesis.  3. Left ventricular ejection fraction 64%  4. Low-risk stress test findings*.  Assessment and Plan:  1. Recently progressive exertional fatigue and shortness of breath reminiscent of prior anginal equivalent. She underwent DES intervention to the obtuse marginal in 2012. Noninvasive imaging from last year was overall low risk with mild ischemic zone that we have been managing medically. In light of progressive symptoms, we discussed proceeding with a diagnostic cardiac catheterization, she is in agreement to proceed after reviewing the risks and benefits. We will need to hold Xarelto for 48 hours prior.  2. CAD status post DES to the obtuse marginal in 2012. No longer on aspirin since started on Xarelto earlier this year.  3. Atypical atrial flutter, maintaining sinus rhythm. She is on Xarelto with CHADSVASC  score of 4.  4. Essential hypertension, continues on Toprol-XL and Norvasc.  5. Hyperlipidemia with history of statin intolerance.  Current medicines were reviewed with the patient today.   Orders Placed This Encounter  Procedures  . EKG 12-Lead    Disposition: FU with me after cardiac catheterization.   Signed, Satira Sark, MD, [facility] 07/23/2015 4:35 PM    [facility] [place], [place], [place] [address] Phone: [phone]; Fax: [phone]

## 2015-07-26 NOTE — Progress Notes (Signed)
Dr Burt Knack in and checked right radial site

## 2015-07-26 NOTE — Interval H&P Note (Signed)
Cath Lab Visit (complete for each Cath Lab visit)  Clinical Evaluation Leading to the Procedure:   ACS: No.  Non-ACS:    Anginal Classification: CCS II  Anti-ischemic medical therapy: Minimal Therapy (1 class of medications)  Non-Invasive Test Results: No non-invasive testing performed  Prior CABG: No previous CABG      History and Physical Interval Note:  07/26/2015 11:58 AM  [name] Fry  has presented today for surgery, with the diagnosis of cad w/unspecified angina  The various methods of treatment have been discussed with the patient and family. After consideration of risks, benefits and other options for treatment, the patient has consented to  Procedure(s): Left Heart Cath and Coronary Angiography (N/A) as a surgical intervention .  The patient's history has been reviewed, patient examined, no change in status, stable for surgery.  I have reviewed the patient's chart and labs.  Questions were answered to the patient's satisfaction.     [name] Fry

## 2015-07-26 NOTE — Research (Signed)
Presented [employer] Research study to patient and family. Questions encouraged and answered.Patient declines.

## 2015-07-29 ENCOUNTER — Encounter (HOSPITAL_COMMUNITY): Payer: Self-pay | Admitting: Cardiovascular Disease

## 2015-08-05 ENCOUNTER — Telehealth: Payer: Self-pay | Admitting: *Deleted

## 2015-08-05 NOTE — Telephone Encounter (Signed)
Pt daughter says pt c/o SOB since starting Toprol XL 50 mg bid post cath 07/26/15. Thinks that med increase is causing SOB. Pt denied any other symptoms. Will forward to Dr. Domenic Polite

## 2015-08-05 NOTE — Telephone Encounter (Signed)
She was on Toprol-XL 50 mg daily previously. Consider decreasing p.m. dose to 25 mg to see if she tolerates this better.

## 2015-08-05 NOTE — Telephone Encounter (Signed)
Daughter Tilda Burrow) made aware. Medication list updated, pt is scheduled for 08/19/15 f/u

## 2015-08-08 ENCOUNTER — Encounter (HOSPITAL_COMMUNITY): Payer: Self-pay | Admitting: Emergency Medicine

## 2015-08-08 ENCOUNTER — Emergency Department (HOSPITAL_COMMUNITY): Payer: Medicare Other

## 2015-08-08 ENCOUNTER — Inpatient Hospital Stay (HOSPITAL_COMMUNITY)
Admission: EM | Admit: 2015-08-08 | Discharge: 2015-08-14 | DRG: 308 | Disposition: A | Discharge status: Home / Self Care | Payer: Medicare Other | Attending: Internal Medicine | Admitting: Internal Medicine

## 2015-08-08 DIAGNOSIS — J9601 Acute respiratory failure with hypoxia: Secondary | ICD-10-CM | POA: Diagnosis present

## 2015-08-08 DIAGNOSIS — Z853 Personal history of malignant neoplasm of breast: Secondary | ICD-10-CM

## 2015-08-08 DIAGNOSIS — E038 Other specified hypothyroidism: Secondary | ICD-10-CM | POA: Diagnosis not present

## 2015-08-08 DIAGNOSIS — J189 Pneumonia, unspecified organism: Secondary | ICD-10-CM | POA: Diagnosis present

## 2015-08-08 DIAGNOSIS — I2510 Atherosclerotic heart disease of native coronary artery without angina pectoris: Secondary | ICD-10-CM | POA: Diagnosis not present

## 2015-08-08 DIAGNOSIS — E872 Acidosis, unspecified: Secondary | ICD-10-CM | POA: Diagnosis not present

## 2015-08-08 DIAGNOSIS — I959 Hypotension, unspecified: Secondary | ICD-10-CM | POA: Diagnosis not present

## 2015-08-08 DIAGNOSIS — K219 Gastro-esophageal reflux disease without esophagitis: Secondary | ICD-10-CM | POA: Diagnosis not present

## 2015-08-08 DIAGNOSIS — Z7901 Long term (current) use of anticoagulants: Secondary | ICD-10-CM | POA: Diagnosis not present

## 2015-08-08 DIAGNOSIS — I4891 Unspecified atrial fibrillation: Secondary | ICD-10-CM | POA: Diagnosis present

## 2015-08-08 DIAGNOSIS — I482 Chronic atrial fibrillation: Principal | ICD-10-CM | POA: Diagnosis present

## 2015-08-08 DIAGNOSIS — I480 Paroxysmal atrial fibrillation: Secondary | ICD-10-CM | POA: Diagnosis present

## 2015-08-08 DIAGNOSIS — Z79899 Other long term (current) drug therapy: Secondary | ICD-10-CM

## 2015-08-08 DIAGNOSIS — E785 Hyperlipidemia, unspecified: Secondary | ICD-10-CM | POA: Diagnosis not present

## 2015-08-08 DIAGNOSIS — Z961 Presence of intraocular lens: Secondary | ICD-10-CM | POA: Diagnosis not present

## 2015-08-08 DIAGNOSIS — R0602 Shortness of breath: Secondary | ICD-10-CM | POA: Diagnosis not present

## 2015-08-08 DIAGNOSIS — Z955 Presence of coronary angioplasty implant and graft: Secondary | ICD-10-CM | POA: Diagnosis not present

## 2015-08-08 DIAGNOSIS — R000 Tachycardia, unspecified: Secondary | ICD-10-CM | POA: Diagnosis not present

## 2015-08-08 DIAGNOSIS — H269 Unspecified cataract: Secondary | ICD-10-CM | POA: Diagnosis present

## 2015-08-08 DIAGNOSIS — I110 Hypertensive heart disease with heart failure: Secondary | ICD-10-CM | POA: Diagnosis not present

## 2015-08-08 DIAGNOSIS — I5021 Acute systolic (congestive) heart failure: Secondary | ICD-10-CM | POA: Diagnosis not present

## 2015-08-08 DIAGNOSIS — Z9841 Cataract extraction status, right eye: Secondary | ICD-10-CM | POA: Diagnosis not present

## 2015-08-08 DIAGNOSIS — I952 Hypotension due to drugs: Secondary | ICD-10-CM | POA: Diagnosis not present

## 2015-08-08 DIAGNOSIS — I5023 Acute on chronic systolic (congestive) heart failure: Secondary | ICD-10-CM | POA: Diagnosis present

## 2015-08-08 DIAGNOSIS — E039 Hypothyroidism, unspecified: Secondary | ICD-10-CM | POA: Diagnosis present

## 2015-08-08 DIAGNOSIS — R531 Weakness: Secondary | ICD-10-CM | POA: Diagnosis not present

## 2015-08-08 DIAGNOSIS — M1990 Unspecified osteoarthritis, unspecified site: Secondary | ICD-10-CM | POA: Diagnosis not present

## 2015-08-08 DIAGNOSIS — R140 Abdominal distension (gaseous): Secondary | ICD-10-CM | POA: Diagnosis not present

## 2015-08-08 DIAGNOSIS — J90 Pleural effusion, not elsewhere classified: Secondary | ICD-10-CM | POA: Diagnosis not present

## 2015-08-08 LAB — BASIC METABOLIC PANEL
ANION GAP: 10 (ref 5–15)
BUN: 14 mg/dL (ref 6–20)
CALCIUM: 9.5 mg/dL (ref 8.9–10.3)
CHLORIDE: 106 mmol/L (ref 101–111)
CO2: 23 mmol/L (ref 22–32)
Creatinine, Ser: 0.85 mg/dL (ref 0.44–1.00)
GFR calc Af Amer: >60 mL/min (ref >60)
GFR calc non Af Amer: >60 mL/min (ref >60)
GLUCOSE: 113 mg/dL — AB (ref 65–99)
Potassium: 3.7 mmol/L (ref 3.5–5.1)
Sodium: 139 mmol/L (ref 135–145)

## 2015-08-08 LAB — CBC WITH DIFFERENTIAL/PLATELET
BASOS ABS: 0.1 10*3/uL (ref 0.0–0.1)
BASOS PCT: 1 %
EOS ABS: 0.2 10*3/uL (ref 0.0–0.7)
Eosinophils Relative: 3 %
HCT: 38.6 % (ref 36.0–46.0)
Hemoglobin: 12.8 g/dL (ref 12.0–15.0)
Lymphocytes Relative: 31 %
Lymphs Abs: 2.4 10*3/uL (ref 0.7–4.0)
MCH: 28.4 pg (ref 26.0–34.0)
MCHC: 33.2 g/dL (ref 30.0–36.0)
MCV: 85.8 fL (ref 78.0–100.0)
MONO ABS: 0.6 10*3/uL (ref 0.1–1.0)
MONOS PCT: 8 %
Neutro Abs: 4.5 10*3/uL (ref 1.7–7.7)
Neutrophils Relative %: 57 %
Platelets: 311 10*3/uL (ref 150–400)
RBC: 4.5 MIL/uL (ref 3.87–5.11)
RDW: 14 % (ref 11.5–15.5)
WBC: 7.8 10*3/uL (ref 4.0–10.5)

## 2015-08-08 LAB — TROPONIN I

## 2015-08-08 LAB — MRSA PCR SCREENING: MRSA BY PCR: NEGATIVE

## 2015-08-08 LAB — TSH: TSH: 3.876 u[IU]/mL (ref 0.350–4.500)

## 2015-08-08 MED ORDER — GUAIFENESIN ER 600 MG PO TB12: 600.0000 mg | ORAL_TABLET | Freq: Two times a day (BID) | ORAL | Status: DC | PRN

## 2015-08-08 MED ORDER — OMEGA-3-ACID ETHYL ESTERS 1 G PO CAPS
1.0000 g | ORAL_CAPSULE | Freq: Every day | ORAL | Status: DC
  Administered 2015-08-09 – 2015-08-14 (×6): 1 g via ORAL
  Filled 2015-08-08 (×6): qty 1

## 2015-08-08 MED ORDER — DEXTROSE 5 % IV SOLN
1.0000 g | INTRAVENOUS | Status: DC
  Administered 2015-08-09 – 2015-08-10 (×2): 1 g via INTRAVENOUS
  Filled 2015-08-08 (×3): qty 10

## 2015-08-08 MED ORDER — LEVOTHYROXINE SODIUM 112 MCG PO TABS
112.0000 ug | ORAL_TABLET | Freq: Every day | ORAL | Status: DC
  Administered 2015-08-09 – 2015-08-14 (×6): 112 ug via ORAL
  Filled 2015-08-08 (×6): qty 1

## 2015-08-08 MED ORDER — SODIUM CHLORIDE 0.9% FLUSH
3.0000 mL | Freq: Two times a day (BID) | INTRAVENOUS | Status: DC
  Administered 2015-08-09 – 2015-08-13 (×8): 3 mL via INTRAVENOUS

## 2015-08-08 MED ORDER — DEXTROSE 5 % IV SOLN
1.0000 g | Freq: Once | INTRAVENOUS | Status: DC
  Filled 2015-08-08: qty 10

## 2015-08-08 MED ORDER — AZITHROMYCIN 500 MG IV SOLR
500.0000 mg | Freq: Once | INTRAVENOUS | Status: AC
  Administered 2015-08-08: 500 mg via INTRAVENOUS
  Filled 2015-08-08: qty 500

## 2015-08-08 MED ORDER — DILTIAZEM LOAD VIA INFUSION
10.0000 mg | Freq: Once | INTRAVENOUS | Status: AC
  Administered 2015-08-08: 10 mg via INTRAVENOUS
  Filled 2015-08-08: qty 10

## 2015-08-08 MED ORDER — DEXTROSE 5 % IV SOLN
500.0000 mg | INTRAVENOUS | Status: DC
  Administered 2015-08-09 – 2015-08-10 (×2): 500 mg via INTRAVENOUS
  Filled 2015-08-08 (×3): qty 500

## 2015-08-08 MED ORDER — SODIUM CHLORIDE 0.9 % IV BOLUS (SEPSIS)
1000.0000 mL | Freq: Once | INTRAVENOUS | Status: AC
  Administered 2015-08-09: 1000 mL via INTRAVENOUS

## 2015-08-08 MED ORDER — DEXTROSE 5 % IV SOLN
1.0000 g | Freq: Once | INTRAVENOUS | Status: AC
  Administered 2015-08-08: 1 g via INTRAVENOUS
  Filled 2015-08-08: qty 10

## 2015-08-08 MED ORDER — DEXTROSE 5 % IV SOLN
INTRAVENOUS | Status: AC
  Filled 2015-08-08: qty 500

## 2015-08-08 MED ORDER — METOPROLOL SUCCINATE ER 25 MG PO TB24
25.0000 mg | ORAL_TABLET | Freq: Two times a day (BID) | ORAL | Status: DC
  Administered 2015-08-08 – 2015-08-14 (×12): 25 mg via ORAL
  Filled 2015-08-08 (×12): qty 1

## 2015-08-08 MED ORDER — DILTIAZEM HCL 100 MG IV SOLR
5.0000 mg/h | INTRAVENOUS | Status: DC
  Administered 2015-08-08: 5 mg/h via INTRAVENOUS
  Administered 2015-08-09 – 2015-08-10 (×3): 10 mg/h via INTRAVENOUS
  Administered 2015-08-11: 5 mg/h via INTRAVENOUS
  Filled 2015-08-08 (×7): qty 100

## 2015-08-08 MED ORDER — DEXTROSE 5 % IV SOLN: 500.0000 mg | Freq: Once | INTRAVENOUS | Status: DC

## 2015-08-08 MED ORDER — RIVAROXABAN 20 MG PO TABS
20.0000 mg | ORAL_TABLET | Freq: Every day | ORAL | Status: DC
  Administered 2015-08-08 – 2015-08-13 (×6): 20 mg via ORAL
  Filled 2015-08-08 (×6): qty 1

## 2015-08-08 NOTE — Progress Notes (Signed)
CTSP:  She developed hypotension with SBP 65.  HR 90. She is mentation, and is cool, but not diaphoretic. Will give IVF boluses, and d/c cardiazem drip. Will hold her betablocker in the am unless her BP is much better. If she is tachy, will give Digitalis.   Orvan Falconer, MD FACP. Hospital Medicine.

## 2015-08-08 NOTE — ED Notes (Signed)
Pt states she was at her pcp's office today for checkup and he noted her heart rate to be fast.  ECG done at office showed afib with rvr per pt.  Pt states she has just been a little more tired and a little short of breath.

## 2015-08-08 NOTE — H&P (Signed)
History and Physical    [name] Fry [id number] DOB: 1946/01/27 DOA: 08/08/2015  Referring MD/NP/PA: Ripley Fraise, MD PCP: [name], MD  Outpatient Specialists:  Patient coming from: home  Chief Complaint: Afib with RVR  HPI: [name] Fry is a 70 y.o. female with medical history significant of HLD, CAD, hypothyroidism, and atrial fibrillation, was sent from her PCP's office after she was found to have Afib with RVR with associated SOB that is worsened with exertion and improved by rest. She reports chronically taking Xarelto, but stopped temporarily for a heart catheter done on 5/12. She has been taking her Xarelto since her catheter, but missed her dose today.  Her cath was negative.  Stent was patent.  Per son, complaints of  likely onset a week ago. She has a stent in place. Reports cough.  Does not drink alcohol. Denies any chest pain, abd pain, or headache.  While in the ED, bloodwork showed that her BMP and CBC were unremarkable. Her troponin was negative. She received 2X bolus Cardizem. Hospitalist was asked to refer for admission.  Review of Systems: As per HPI otherwise 10 point review of systems negative.    Past Medical History  Diagnosis Date  . Hypothyroidism   . Hyperlipidemia   . GERD (gastroesophageal reflux disease)   . Coronary atherosclerosis of native coronary artery     DES OM 3/12  . Essential hypertension, benign   . H/O hiatal hernia   . Arthritis   . Cataracts, bilateral   . Breast cancer ([place])     Left  . Atrial fibrillation [facility])     Documented January 2017  . Atrial fibrillation ([place]) 07/26/2015    Past Surgical History  Procedure Laterality Date  . Mastectomy Left     1997; S/P chemotherapy/radiation  . Cholecystectomy    . Appendectomy    . Tubal ligation    . 25 gauge pars plana vitrectomy with 20 gauge mvr port Right 09/29/2012    Procedure: 25 GAUGE PARS PLANA VITRECTOMY WITH 20 GAUGE MVR PORT;  Surgeon: Hayden Pedro, MD;   Location: [place];  Service: Ophthalmology;  Laterality: Right;  . Laser photo ablation Right 09/29/2012    Procedure: LASER PHOTO ABLATION;  Surgeon: Hayden Pedro, MD;  Location: [place];  Service: Ophthalmology;  Laterality: Right;  Headscope laser  . Membrane peel Right 09/29/2012    Procedure: MEMBRANE PEEL;  Surgeon: Hayden Pedro, MD;  Location: [place];  Service: Ophthalmology;  Laterality: Right;  . Gas/fluid exchange Right 09/29/2012    Procedure: GAS/FLUID EXCHANGE;  Surgeon: Hayden Pedro, MD;  Location: [place];  Service: Ophthalmology;  Laterality: Right;  . Cataract extraction w/phaco Right 05/18/2013    Procedure: CATARACT EXTRACTION PHACO AND INTRAOCULAR LENS PLACEMENT RIGHT EYE;  Surgeon: Tonny Branch, MD;  [facility];  Service: Ophthalmology;  Laterality: Right;  CDE:13.35  . Cardiac catheterization N/A 07/26/2015    Procedure: Left Heart Cath and Coronary Angiography;  Surgeon: [name], MD;  Location: [place] [facility];  Service: Cardiovascular;  Laterality: N/A;     reports that she has never smoked. She has never used smokeless tobacco. She reports that she does not drink alcohol or use illicit drugs.  Allergies  Allergen Reactions  . Doxycycline Rash    Family History  Problem Relation Age of Onset  . Heart attack Father 44   Prior to Admission medications   Medication Sig Start Date End Date Taking? Authorizing Provider  amLODipine (NORVASC) 2.5 MG tablet TAKE 1 TABLET BY MOUTH EVERY DAY 05/20/15  Yes Satira Sark, MD  levothyroxine (SYNTHROID, LEVOTHROID) 112 MCG tablet Take 112 mcg by mouth daily.    Yes Historical Provider, MD  metoprolol succinate (TOPROL-XL) 50 MG 24 hr tablet Take 25 mg by mouth 2 (two) times daily.    Yes Historical Provider, MD  Multiple Vitamins-Minerals (OCUVITE PRESERVISION PO) Take 2 tablets by mouth daily.   Yes Historical Provider, MD  Omega-3 Fatty Acids (FISH OIL) 1000 MG CAPS Take 1 capsule (1,000 mg total) by mouth 2  (two) times daily. Patient taking differently: Take 1,200 mg by mouth 2 (two) times daily.  04/14/13  Yes Satira Sark, MD  rivaroxaban (XARELTO) 20 MG TABS tablet Take 1 tablet (20 mg total) by mouth daily with supper. 05/23/15  Yes Satira Sark, MD  Turmeric 500 MG CAPS Take 500 mg by mouth daily. Reported on 04/11/2015   Yes Historical Provider, MD  nitroGLYCERIN (NITROSTAT) 0.4 MG SL tablet Place 1 tablet (0.4 mg total) under the tongue every 5 (five) minutes x 3 doses as needed for chest pain. 05/14/14   Satira Sark, MD    Physical Exam: Filed Vitals:   08/08/15 1800 08/08/15 1815 08/08/15 1845 08/08/15 1900  BP: 122/89 105/84 113/86 111/84  Pulse: 146 139 138 139  TempSrc:      Resp: 28 29 25 22   Height:      Weight:      SpO2: 93% 93% 96% 97%   Constitutional: NAD, calm, comfortable Filed Vitals:   08/08/15 1800 08/08/15 1815 08/08/15 1845 08/08/15 1900  BP: 122/89 105/84 113/86 111/84  Pulse: 146 139 138 139  TempSrc:      Resp: 28 29 25 22   Height:      Weight:      SpO2: 93% 93% 96% 97%   Eyes: PERRL, lids and conjunctivae normal ENMT: Mucous membranes are moist. Posterior pharynx clear of any exudate or lesions.Normal dentition.  Neck: normal, supple, no masses, no thyromegaly Respiratory: No wheezing. Normal respiratory effort. No accessory muscle use. Crackles bilaterally Cardiovascular:  no murmurs / rubs / gallops. No extremity edema. 2+ pedal pulses. No carotid bruits. Irregular rate and rhythm Abdomen: no tenderness, no masses palpated. No hepatosplenomegaly. Bowel sounds positive.  Musculoskeletal: no clubbing / cyanosis. No joint deformity upper and lower extremities. Good ROM, no contractures. Normal muscle tone.  Skin: no rashes, lesions, ulcers. No induration Neurologic: CN 2-12 grossly intact. Sensation intact, DTR normal. Strength 5/5 in all 4.  Psychiatric: Normal judgment and insight. Alert and oriented x 3. Normal mood.    Labs on  Admission: I have personally reviewed following labs and imaging studies  CBC:  Recent Labs Lab 08/08/15 1720  WBC 7.8  NEUTROABS 4.5  HGB 12.8  HCT 38.6  MCV 85.8  PLT [id number]   Basic Metabolic Panel:  Recent Labs Lab 08/08/15 1720  NA 139  K 3.7  CL 106  CO2 23  GLUCOSE 113*  BUN 14  CREATININE 0.85  CALCIUM 9.5   GFR: Estimated Creatinine Clearance: 66.2 mL/min (by C-G formula based on Cr of 0.85).  Cardiac Enzymes:  Recent Labs Lab 08/08/15 1720  TROPONINI <0.03    Radiological Exams on Admission: Dg Chest Portable 1 View  08/08/2015  CLINICAL DATA:  Tachycardia EXAM: PORTABLE CHEST 1 VIEW COMPARISON:  09/29/2012 FINDINGS: Radiographic technique is inadequate. The image is over exposed and detail of the left upper  lobe is lost. There is mild to moderate cardiac enlargement. The vascular pattern is normal. The left lower lobe appears clear while there is mild opacity in the medial right lower lobe. IMPRESSION: Mild to moderate cardiac enlargement with no evidence of pulmonary edema. Right lower lobe opacity could represent infiltrate or atelectasis. Cannot evaluate left upper lobe due to inadequate radiographic technique. Electronically Signed   By: Skipper Cliche M.D.   On: 08/08/2015 18:43    EKG: Independently reviewed. Atrial fibrillation with RVR  Assessment/Plan Principal Problem:   Atrial fibrillation (HCC) Active Problems:   Hyperlipidemia   Hypothyroidism   CAP (community acquired pneumonia)   1. Paroxysmal Afib with RVR.  Stress test from Feb 2016 was positive. Past heart cath on 5/12 was negative. Will restart Xarelto. Continue Cardizem. Continue BB and cycle troponins.  2. Possible CAP. CXR showed possible infiltrate in right lower lobe. She has crackles bilaterally on exam. Continue azithromycin and Rocephin.  3. HLD. Continue statins.  4. Hypothyroidism. Continue synthroid. Check TSH   DVT prophylaxis: Xarelto Code Status: Full Family  Communication: Discussed with Daughter Dianna and son Roselyn Reef at bedside Disposition Plan: discharge home once improved Consults called: none Admission status: admit to ICU   Orvan Falconer, MD FACP [facility]  If 7PM-7AM, please contact night-coverage [email or web address] Password [email or web address]  08/08/2015, 7:10 PM   By signing my name below, I, Delene Ruffini, attest that this documentation has been prepared under the direction and in the presence of Orvan Falconer, MD. Electronically Signed: Delene Ruffini, Scribe 08/08/2015 7:11pm

## 2015-08-08 NOTE — ED Notes (Signed)
Dr. Christy Gentles made aware of patient. No new orders given.

## 2015-08-08 NOTE — ED Notes (Signed)
Pt states she has decreased her Toprol dose as she felt she was taking too much.  States she has been taking 25mg  bid.

## 2015-08-08 NOTE — ED Provider Notes (Signed)
CSN: [id number]     Arrival date & time 08/08/15  1651 History   First MD Initiated Contact with Patient 08/08/15 1704     Chief Complaint  Patient presents with  . Tachycardia     Patient is a 70 y.o. female presenting with weakness. The history is provided by the patient and a relative.  Weakness This is a new problem. The current episode started 6 to 12 hours ago. The problem occurs constantly. The problem has been gradually worsening. Associated symptoms include shortness of breath. Pertinent negatives include no chest pain, no abdominal pain and no headaches. The symptoms are aggravated by exertion. The symptoms are relieved by rest.  patient with h/o atrial fibrillation, hyperlipidemia who reports she started feeling generalized fatigue earlier today She reports feeling SOB No cp No fever No vomiting She was seen at PCP office and noted to be tachycardic and sent for evaluation  Past Medical History  Diagnosis Date  . Hypothyroidism   . Hyperlipidemia   . GERD (gastroesophageal reflux disease)   . Coronary atherosclerosis of native coronary artery     DES OM 3/12  . Essential hypertension, benign   . H/O hiatal hernia   . Arthritis   . Cataracts, bilateral   . Breast cancer ([place])     Left  . Atrial fibrillation [facility])     Documented January 2017  . Atrial fibrillation ([place]) 07/26/2015   Past Surgical History  Procedure Laterality Date  . Mastectomy Left     1997; S/P chemotherapy/radiation  . Cholecystectomy    . Appendectomy    . Tubal ligation    . 25 gauge pars plana vitrectomy with 20 gauge mvr port Right 09/29/2012    Procedure: 25 GAUGE PARS PLANA VITRECTOMY WITH 20 GAUGE MVR PORT;  Surgeon: Hayden Pedro, MD;  Location: [place];  Service: Ophthalmology;  Laterality: Right;  . Laser photo ablation Right 09/29/2012    Procedure: LASER PHOTO ABLATION;  Surgeon: Hayden Pedro, MD;  Location: [place];  Service: Ophthalmology;  Laterality: Right;  Headscope laser  .  Membrane peel Right 09/29/2012    Procedure: MEMBRANE PEEL;  Surgeon: Hayden Pedro, MD;  Location: [place];  Service: Ophthalmology;  Laterality: Right;  . Gas/fluid exchange Right 09/29/2012    Procedure: GAS/FLUID EXCHANGE;  Surgeon: Hayden Pedro, MD;  Location: [place];  Service: Ophthalmology;  Laterality: Right;  . Cataract extraction w/phaco Right 05/18/2013    Procedure: CATARACT EXTRACTION PHACO AND INTRAOCULAR LENS PLACEMENT RIGHT EYE;  Surgeon: Tonny Branch, MD;  [facility]: [facility];  Service: Ophthalmology;  Laterality: Right;  CDE:13.35  . Cardiac catheterization N/A 07/26/2015    Procedure: Left Heart Cath and Coronary Angiography;  Surgeon: [name], MD;  Location: [place] [facility];  Service: Cardiovascular;  Laterality: N/A;   Family History  Problem Relation Age of Onset  . Heart attack Father 47   Social History  Substance Use Topics  . Smoking status: Never Smoker   . Smokeless tobacco: Never Used  . Alcohol Use: No   OB History    No data available     Review of Systems  Constitutional: Positive for fatigue. Negative for fever.  Respiratory: Positive for shortness of breath.   Cardiovascular: Negative for chest pain.  Gastrointestinal: Negative for abdominal pain.  Neurological: Positive for weakness. Negative for headaches.  All other systems reviewed and are negative.     Allergies  Doxycycline  Home Medications   Prior to  Admission medications   Medication Sig Start Date End Date Taking? Authorizing Provider  amLODipine (NORVASC) 2.5 MG tablet TAKE 1 TABLET BY MOUTH EVERY DAY 05/20/15  Yes Satira Sark, MD  levothyroxine (SYNTHROID, LEVOTHROID) 112 MCG tablet Take 112 mcg by mouth daily.    Yes Historical Provider, MD  metoprolol succinate (TOPROL-XL) 50 MG 24 hr tablet Take 25 mg by mouth 2 (two) times daily.    Yes Historical Provider, MD  Multiple Vitamins-Minerals (OCUVITE PRESERVISION PO) Take 2 tablets by mouth daily.   Yes Historical  Provider, MD  Omega-3 Fatty Acids (FISH OIL) 1000 MG CAPS Take 1 capsule (1,000 mg total) by mouth 2 (two) times daily. Patient taking differently: Take 1,200 mg by mouth 2 (two) times daily.  04/14/13  Yes Satira Sark, MD  rivaroxaban (XARELTO) 20 MG TABS tablet Take 1 tablet (20 mg total) by mouth daily with supper. 05/23/15  Yes Satira Sark, MD  Turmeric 500 MG CAPS Take 500 mg by mouth daily. Reported on 04/11/2015   Yes Historical Provider, MD  nitroGLYCERIN (NITROSTAT) 0.4 MG SL tablet Place 1 tablet (0.4 mg total) under the tongue every 5 (five) minutes x 3 doses as needed for chest pain. 05/14/14   Satira Sark, MD   BP 122/89 mmHg  Pulse 146  Resp 28  Ht 5\' 3"  (1.6 m)  Wt 91.627 kg  BMI 35.79 kg/m2  SpO2 93%  LMP 09/28/2012 Physical Exam CONSTITUTIONAL: Well developed/well nourished HEAD: Normocephalic/atraumatic EYES: EOMI/PERRL ENMT: Mucous membranes moist NECK: supple no meningeal signs SPINE/BACK:entire spine nontender [id number] LUNGS: crackles bilaterally, mild tachypnea noted ABDOMEN: soft, nontender, no rebound or guarding GU:no cva tenderness NEURO: Pt is awake/alert/appropriate, moves all extremitiesx4.  No facial droop.   EXTREMITIES: pulses normal/equal, full ROM SKIN: warm, color normal PSYCH: no abnormalities of mood noted, alert and oriented to situation  ED Course  Procedures  CRITICAL CARE Performed by: Sharyon Cable Total critical care time: 35 minutes Critical care time was exclusive of separately billable procedures and treating other patients. Critical care was necessary to treat or prevent imminent or life-threatening deterioration. Critical care was time spent personally by me on the following activities: development of treatment plan with patient and/or surrogate as well as nursing, discussions with consultants, evaluation of patient's response to treatment, examination of patient, obtaining history from patient or surrogate,  ordering and performing treatments and interventions, ordering and review of laboratory studies, ordering and review of radiographic studies, pulse oximetry and re-evaluation of patient's condition. PATIENT WITH TACHYCARDIA, AFIB/FLUTTER WITH HR>140 REQUIRING IV CARDIZEM DRIP SHE ALSO HAS PNEUMONIA BY CHEST XRAY SHE WILL REQUIRE STEPDOWN ADMISSION  Medications  diltiazem (CARDIZEM) 1 mg/mL load via infusion 10 mg (10 mg Intravenous Given 08/08/15 1759)    And  diltiazem (CARDIZEM) 100 mg in dextrose 5 % 100 mL (1 mg/mL) infusion (10 mg/hr Intravenous Rate/Dose Change 08/08/15 1815)  cefTRIAXone (ROCEPHIN) 1 g in dextrose 5 % 50 mL IVPB (not administered)  azithromycin (ZITHROMAX) 500 mg in dextrose 5 % 250 mL IVPB (not administered)    Labs Review Labs Reviewed  BASIC METABOLIC PANEL - Abnormal; Notable for the following:    Glucose, Bld 113 (*)    All other components within normal limits  CBC WITH DIFFERENTIAL/PLATELET  TROPONIN I    Imaging Review Dg Chest Portable 1 View  08/08/2015  CLINICAL DATA:  Tachycardia EXAM: PORTABLE CHEST 1 VIEW COMPARISON:  09/29/2012 FINDINGS: Radiographic technique is inadequate. The image is over exposed  and detail of the left upper lobe is lost. There is mild to moderate cardiac enlargement. The vascular pattern is normal. The left lower lobe appears clear while there is mild opacity in the medial right lower lobe. IMPRESSION: Mild to moderate cardiac enlargement with no evidence of pulmonary edema. Right lower lobe opacity could represent infiltrate or atelectasis. Cannot evaluate left upper lobe due to inadequate radiographic technique. Electronically Signed   By: Skipper Cliche M.D.   On: 08/08/2015 18:43   I have personally reviewed and evaluated these images and lab results as part of my medical decision-making.   EKG Interpretation   Date/Time:  Thursday Aug 08 2015 17:00:29 EDT Ventricular Rate:  141 PR Interval:    QRS Duration: 128 QT  Interval:  292 QTC Calculation: 447 R Axis:   -44 Text Interpretation:  rhythm indeterminate, suspect atrial flutter  Ventricular premature complex Left bundle branch block rate is faster when  compared to prior Confirmed by Christy Gentles  MD, [place] ([id number]) on 08/08/2015  5:06:53 PM     6:12 PM Pt with h/o afib She is on xarelto but reports missing several doses earlier this month so she could have cardiac cath She is currently in afib/flutter with RVR and very symptomatic Due to recent missed doses (about 2 weeks ago) I don't think she is candidate for cardioversion as risk for stroke is high.   IV cardizem ordered 7:24 PM PT WITH SOME IMPROVEMENT IN TACHYCARDIA SHE IS FEELING IMPROVED WILL ADMIT SHE IS NOTED TO HAVE PNEUMONIA BY CHEST XRAY D/W DR LE FOR ADMISSION TO STEPDOWN UNIT  MDM   Final diagnoses:  CAP (community acquired pneumonia)  Atrial fibrillation with rapid ventricular response ([place])    Nursing notes including past medical history and social history reviewed and considered in documentation xrays/imaging reviewed by myself and considered during evaluation Labs/vital reviewed myself and considered during evaluation Previous records reviewed and considered     Ripley Fraise, MD 08/08/15 1925

## 2015-08-09 ENCOUNTER — Encounter (HOSPITAL_COMMUNITY): Payer: Self-pay | Admitting: Cardiology

## 2015-08-09 DIAGNOSIS — E872 Acidosis, unspecified: Secondary | ICD-10-CM | POA: Diagnosis not present

## 2015-08-09 DIAGNOSIS — J189 Pneumonia, unspecified organism: Secondary | ICD-10-CM

## 2015-08-09 DIAGNOSIS — I959 Hypotension, unspecified: Secondary | ICD-10-CM | POA: Diagnosis present

## 2015-08-09 DIAGNOSIS — I952 Hypotension due to drugs: Secondary | ICD-10-CM

## 2015-08-09 DIAGNOSIS — E038 Other specified hypothyroidism: Secondary | ICD-10-CM

## 2015-08-09 DIAGNOSIS — I2510 Atherosclerotic heart disease of native coronary artery without angina pectoris: Secondary | ICD-10-CM

## 2015-08-09 DIAGNOSIS — I4891 Unspecified atrial fibrillation: Secondary | ICD-10-CM

## 2015-08-09 DIAGNOSIS — E039 Hypothyroidism, unspecified: Secondary | ICD-10-CM

## 2015-08-09 LAB — CBC
HCT: 35.5 % — ABNORMAL LOW (ref 36.0–46.0)
Hemoglobin: 11.7 g/dL — ABNORMAL LOW (ref 12.0–15.0)
MCH: 28.5 pg (ref 26.0–34.0)
MCHC: 33 g/dL (ref 30.0–36.0)
MCV: 86.6 fL (ref 78.0–100.0)
PLATELETS: 283 10*3/uL (ref 150–400)
RBC: 4.1 MIL/uL (ref 3.87–5.11)
RDW: 14.2 % (ref 11.5–15.5)
WBC: 6.8 10*3/uL (ref 4.0–10.5)

## 2015-08-09 LAB — BASIC METABOLIC PANEL
Anion gap: 13 (ref 5–15)
Anion gap: 7 (ref 5–15)
BUN: 10 mg/dL (ref 6–20)
BUN: 11 mg/dL (ref 6–20)
CALCIUM: 8.6 mg/dL — AB (ref 8.9–10.3)
CHLORIDE: 108 mmol/L (ref 101–111)
CO2: 16 mmol/L — AB (ref 22–32)
CO2: 22 mmol/L (ref 22–32)
CREATININE: 0.66 mg/dL (ref 0.44–1.00)
CREATININE: 0.58 mg/dL (ref 0.44–1.00)
Calcium: 9.1 mg/dL (ref 8.9–10.3)
Chloride: 108 mmol/L (ref 101–111)
GFR calc Af Amer: >60 mL/min (ref >60)
GFR calc non Af Amer: >60 mL/min (ref >60)
GLUCOSE: 123 mg/dL — AB (ref 65–99)
GLUCOSE: 123 mg/dL — AB (ref 65–99)
POTASSIUM: 3.6 mmol/L (ref 3.5–5.1)
Potassium: 4 mmol/L (ref 3.5–5.1)
SODIUM: 137 mmol/L (ref 135–145)
Sodium: 137 mmol/L (ref 135–145)

## 2015-08-09 LAB — TROPONIN I: Troponin I: <0.03 ng/mL (ref <0.031)

## 2015-08-09 MED ORDER — AMIODARONE HCL IN DEXTROSE 360-4.14 MG/200ML-% IV SOLN
30.0000 mg/h | INTRAVENOUS | Status: DC
  Administered 2015-08-09 – 2015-08-11 (×5): 30 mg/h via INTRAVENOUS
  Filled 2015-08-09 (×5): qty 200

## 2015-08-09 MED ORDER — SODIUM CHLORIDE 0.9 % IV SOLN
INTRAVENOUS | Status: DC
  Administered 2015-08-09 – 2015-08-10 (×4): via INTRAVENOUS

## 2015-08-09 MED ORDER — LEVALBUTEROL HCL 1.25 MG/0.5ML IN NEBU
1.2500 mg | INHALATION_SOLUTION | Freq: Four times a day (QID) | RESPIRATORY_TRACT | Status: DC
  Administered 2015-08-09: 1.25 mg via RESPIRATORY_TRACT
  Filled 2015-08-09: qty 0.5

## 2015-08-09 MED ORDER — AMIODARONE HCL IN DEXTROSE 360-4.14 MG/200ML-% IV SOLN
60.0000 mg/h | INTRAVENOUS | Status: AC
  Administered 2015-08-09: 60 mg/h via INTRAVENOUS
  Filled 2015-08-09: qty 200

## 2015-08-09 MED ORDER — LEVALBUTEROL HCL 1.25 MG/0.5ML IN NEBU
1.2500 mg | INHALATION_SOLUTION | Freq: Four times a day (QID) | RESPIRATORY_TRACT | Status: DC | PRN
Start: 2015-08-09 — End: 2015-08-10
  Administered 2015-08-10: 1.25 mg via RESPIRATORY_TRACT
  Filled 2015-08-09: qty 0.5

## 2015-08-09 MED ORDER — LORAZEPAM 0.5 MG PO TABS
0.5000 mg | ORAL_TABLET | Freq: Once | ORAL | Status: AC
  Administered 2015-08-09: 0.5 mg via ORAL
  Filled 2015-08-09: qty 1

## 2015-08-09 NOTE — Consult Note (Signed)
Requesting provider: Dr. Rexene Alberts Primary cardiologist: Dr. Satira Sark Consulting cardiologist: Dr. Satira Sark  Reason for consultation: Rapid atrial fibrillation  Clinical Summary [name] is a 70 y.o.female with past medical history outlined below, admitted through the ER with rapid atrial fibrillation. She was seen by her PCP Dr. Nadara Mustard for a routine visit when it was noted that her heart rate was elevated. She had been experiencing dyspnea and fatigue at baseline. She was admitted to the hospitalist service, has been managed with IV diltiazem, although did develop hypotension at 15 mg/h requiring fluid bolus and discontinuation of drip temporarily. She is concurrently being managed for possible CAP. We are asked to assist with her cardiac management.  She had a recent cardiac catheterization done earlier this month demonstrating widely patent obtuse marginal stent and otherwise mild coronary atherosclerosis. No evidence of ACS by follow-up cardiac markers during this hospital stay. She was noted to have atrial fibrillation at the time of her procedure and beta blocker was increased by Dr. Burt Knack. She did not tolerate Toprol at 100 mg daily however and had to cut the dose back to 50 mg daily. This has not adequately controlled her heart rate.  CHADSVASC score is 4. She is on Xarelto, held for 3 days around the time of her cardiac catheterization earlier this month.   Allergies  Allergen Reactions  . Doxycycline Rash    Medications Scheduled Medications: . azithromycin  500 mg Intravenous Q24H  . cefTRIAXone (ROCEPHIN)  IV  1 g Intravenous Q24H  . levothyroxine  112 mcg Oral QAC breakfast  . metoprolol succinate  25 mg Oral BID  . omega-3 acid ethyl esters  1 g Oral Daily  . rivaroxaban  20 mg Oral Q supper  . sodium chloride flush  3 mL Intravenous Q12H    Infusions: . sodium chloride 100 mL/hr at 08/09/15 0600  . diltiazem (CARDIZEM) infusion 10 mg/hr  (08/09/15 0600)    PRN Medications: guaiFENesin, levalbuterol   Past Medical History  Diagnosis Date  . Hypothyroidism   . Hyperlipidemia   . GERD (gastroesophageal reflux disease)   . Coronary atherosclerosis of native coronary artery     DES OM 3/12  . Essential hypertension, benign   . H/O hiatal hernia   . Arthritis   . Cataracts, bilateral   . Breast cancer ([place])     Left  . Atrial fibrillation [facility])     Documented January 2017    Past Surgical History  Procedure Laterality Date  . Mastectomy Left     1997; S/P chemotherapy/radiation  . Cholecystectomy    . Appendectomy    . Tubal ligation    . 25 gauge pars plana vitrectomy with 20 gauge mvr port Right 09/29/2012    Procedure: 25 GAUGE PARS PLANA VITRECTOMY WITH 20 GAUGE MVR PORT;  Surgeon: Hayden Pedro, MD;  Location: [place];  Service: Ophthalmology;  Laterality: Right;  . Laser photo ablation Right 09/29/2012    Procedure: LASER PHOTO ABLATION;  Surgeon: Hayden Pedro, MD;  Location: [place];  Service: Ophthalmology;  Laterality: Right;  Headscope laser  . Membrane peel Right 09/29/2012    Procedure: MEMBRANE PEEL;  Surgeon: Hayden Pedro, MD;  Location: [place];  Service: Ophthalmology;  Laterality: Right;  . Gas/fluid exchange Right 09/29/2012    Procedure: GAS/FLUID EXCHANGE;  Surgeon: Hayden Pedro, MD;  Location: [place];  Service: Ophthalmology;  Laterality: Right;  . Cataract extraction w/phaco Right  05/18/2013    Procedure: CATARACT EXTRACTION PHACO AND INTRAOCULAR LENS PLACEMENT RIGHT EYE;  Surgeon: Tonny Branch, MD;  [facility];  Service: Ophthalmology;  Laterality: Right;  CDE:13.35  . Cardiac catheterization N/A 07/26/2015    Procedure: Left Heart Cath and Coronary Angiography;  Surgeon: [name], MD;  Location: [place] [facility];  Service: Cardiovascular;  Laterality: N/A;    Family History  Problem Relation Age of Onset  . Heart attack Father 38    Social History [name] reports that she  has never smoked. She has never used smokeless tobacco. [name] reports that she does not drink alcohol.  Review of Systems Complete review of systems negative except as otherwise outlined in the clinical summary and also the following. Fatigue, no exertional chest pain. No fevers or chills.  Physical Examination Blood pressure 104/89, pulse 62, temperature 97.5 F (36.4 C), temperature source Oral, resp. rate 21, height 5\' 3"  (1.6 m), weight 199 lb 8.3 oz (90.5 kg), last menstrual period 09/28/2012, SpO2 99 %.  Intake/Output Summary (Last 24 hours) at 08/09/15 0846 Last data filed at 08/09/15 0600  Gross per 24 hour  Intake    869 ml  Output      0 ml  Net    869 ml   Telemetry: Rapid atrial fibrillation.  Gen.: Patient in no acute distress, seated in bed side chair. HEENT: Conjunctiva and lids normal, oropharynx clear. Neck: Supple, no elevated JVP or carotid bruits, no thyromegaly. Lungs: Diminished breath sounds with scattered rhonchi, nonlabored breathing at rest. Cardiac: Irregularly irregular, distant heart sounds,, no S3 or significant systolic murmur, no pericardial rub. Abdomen: Soft, nontender, bowel sounds present, no guarding or rebound. Extremities: No pitting edema, distal pulses 2+. Skin: Warm and dry. Musculoskeletal: No kyphosis. Neuropsychiatric: Alert and oriented x3, affect grossly appropriate.  Lab Results  Basic Metabolic Panel:  Recent Labs Lab 08/08/15 1720 08/09/15 0240  NA 139 137  K 3.7 4.0  CL 106 108  CO2 23 16*  GLUCOSE 113* 123*  BUN 14 10  CREATININE 0.85 0.66  CALCIUM 9.5 8.6*    CBC:  Recent Labs Lab 08/08/15 1720  WBC 7.8  NEUTROABS 4.5  HGB 12.8  HCT 38.6  MCV 85.8  PLT 311    Cardiac Enzymes:  Recent Labs Lab 08/08/15 1720 08/08/15 2143 08/09/15 0240  TROPONINI <0.03 <0.03 <0.03    ECG Tracing from 08/08/2015 showed rapid atrial fibrillation with left bundle branch block.  Imaging Chest x-ray  08/08/2015: FINDINGS: Radiographic technique is inadequate. The image is over exposed and detail of the left upper lobe is lost. There is mild to moderate cardiac enlargement. The vascular pattern is normal. The left lower lobe appears clear while there is mild opacity in the medial right lower lobe.  IMPRESSION: Mild to moderate cardiac enlargement with no evidence of pulmonary edema.  Right lower lobe opacity could represent infiltrate or atelectasis.  Cannot evaluate left upper lobe due to inadequate radiographic Technique.  Cardiac catheterization 07/26/2015:  Prox LAD lesion, 30% stenosed.  The left ventricular systolic function is normal.  1. Single-vessel coronary artery disease with wide patency of the stented segment in the left circumflex 2. Mild coronary irregularity and calcification with no evidence of obstruction in the left main, LAD, or right coronary artery 3. Normal LV systolic function  Impression  1. Persistent, rapid atrial fibrillation associated with fatigue and dyspnea and exertion. Outpatient management with increasing beta blocker dosage was not effective. CHADSVASC score  is 4, she is on Xarelto (was off for 3 days around her cardiac catheterization on May 12). Still with elevated heart rate on intravenous Cardizem, dose was limited by hypotension.  2. CAD status post DES to the obtuse marginal in 2012, patent at recent angiography. No evidence of ACS by cardiac enzymes.  3. Essential hypertension, blood pressure stable at present.  4. Hypothyroidism, on Synthroid. TSH normal at 3.9.  Recommendations  Discussed situation with patient and daughter at bedside. Recommend initiating IV amiodarone to help with further heart rate control. Will keep on lower dose Toprol-XL as blood pressure tolerates, although probably not require all 3 medications long-term. Would plan on considering a TEE guided cardioversion next week once she has had loading of  amiodarone to help restore sinus rhythm and hopefully maintain rhythm longer term. Check baseline LFTs. Recent TSH normal.  Satira Sark, M.D., F.A.C.C.

## 2015-08-09 NOTE — Progress Notes (Signed)
Notified physician to see pt due to restlessness and stated difficulty breathing, no appeaarance of difficulty noted. Orders received and completed. Pt appears improved and states relief. Pt currently sitting in chair per her request.

## 2015-08-09 NOTE — Care Management Note (Addendum)
Case Management Note  Patient Details  Name: JERNEI PADILLO MRN: [id number] Date of Birth: 1945/08/06  Subjective/Objective:                  Pt admitted with a-fib. Pt is from home, lives alone and is ind with ADL's. Pt has strong family support. Pt has PCP, transportation and no difficulty affording her medications. Pt on Xeralto PTA. Pt plans to return home with self care at DC.   Action/Plan: Anticipate DC home over weekend. No CM needs anticipated.   Expected Discharge Date:    08/10/2015              Expected Discharge Plan:  Home/Self Care  In-House Referral:  NA  Discharge planning Services  CM Consult  Post Acute Care Choice:  NA Choice offered to:  NA  DME Arranged:    DME Agency:     HH Arranged:    HH Agency:     Status of Service:  Completed, signed off  Medicare Important Message Given:    Date Medicare IM Given:    Medicare IM give by:    Date Additional Medicare IM Given:    Additional Medicare Important Message give by:     If discussed at [place] of Stay Meetings, dates discussed:    Additional Comments:  Sherald Barge, RN 08/09/2015, 1:57 PM

## 2015-08-09 NOTE — Care Management Important Message (Signed)
Important Message  Patient Details  Name: ELZENA STOFFERS MRN: [id number] Date of Birth: 1945-07-28   Medicare Important Message Given:  Yes    Sherald Barge, RN 08/09/2015, 2:06 PM

## 2015-08-09 NOTE — Progress Notes (Signed)
0800: Dr. Domenic Polite on unit. Made aware of new cardiology consult. To see patient.

## 2015-08-09 NOTE — Progress Notes (Signed)
PROGRESS NOTE    [name] Fry  [id number] DOB: 1945/06/22 DOA: 08/08/2015 PCP: [name], MD    Brief Narrative:  Patient is a 70 year old woman with a history of chronic atrial fibrillation on Xarelto, nonobstructive CAD with previous stenting, and hypothyroidism, who presented from her PCPs office when she was found to be in atrial fibrillation with rapid ventricular response. The patient had mild shortness of breath, otherwise she reported no chest pain during the RVR. She recently underwent a heart catheterization on 07/26/15 which revealed a patent stent and nonobstructive CAD. In the ED, she was tachycardic with a heart rate ranging from 120s to the 150s. Her EKG revealed atrial fibrillation with rapid ventricular response. Her chest x-ray revealed right lower lobe opacity which could reflect infiltrate or atelectasis; mild to moderate cardiac enlargement with no evidence of pulmonary edema. Her initial blood work was unremarkable including a negative troponin I. She was admitted for further evaluation and management.   Assessment & Plan:   Principal Problem:   Atrial fibrillation with RVR (HCC) Active Problems:   Coronary atherosclerosis of native coronary artery   CAP (community acquired pneumonia)   Hypotension   Hyperlipidemia   Hypothyroidism   Metabolic acidosis   1. Chronic atrial fibrillation with RVR. Patient was restarted on Xarelto. Diltiazem drip was started. Toprol-XL was restarted. Amlodipine was held. Overnight, the patient became hypotensive with a systolic blood pressure in the 60s-she was mildly symptomatic. Diltiazem drip was discontinued temporarily and she was given IV fluid boluses. Toprol-XL was withheld. -Her blood pressure has improved and the diltiazem drip has been restarted. Metoprolol XL was restarted. -Patient's heart rate is still elevated, so will consult cardiology. -Her TSH was within normal limits.  Coronary artery disease with previous  stenting. Patient underwent cardiac catheterization on 07/26/15 and revealed nonobstructive CAD and a patent stent in the left circumflex. Her EF was within normal limits at 55-60%. Patient does not report chest pain. Her troponin I has been negative 3. Continue medical management.  Hypertension. The patient is treated chronically with Toprol-XL and amlodipine. As stated above, she became hypotensive on the diltiazem drip. We'll continue to hold Norvasc. Toprol-XL was restarted as tolerated.  Community-acquired pneumonia. Patient's chest x-ray on admission was adjusted of an infiltrate versus atelectasis. She has had a mild cough at home, but no subjective fever. Azithromycin and Rocephin were started.  Hypothyroidism. The patient was restarted on Synthroid. Her TSH is within normal limits.  Metabolic acidosis. Patient CO2 fell to 16. Etiology is unknown. Will repeat basic metabolic panel and if her CO2 is still low, will check a lactic acid level and ABG.   DVT prophylaxis: Xarelto Code Status: Full code Family Communication: Discussed with daughter Disposition Plan: Discharge with clinically appropriate, likely in 2-3 days.   Consultants:   Cardiology  Procedures:   None  Antimicrobials: (specify start and planned stop date. Auto populated tables are space occupying and do not give end dates)  Azithromycin 08/08/15>   Rocephin 08/08/15>    Subjective: Patient is sitting up in the chair, without complaints of chest pain or shortness of breath at rest.  Objective: Filed Vitals:   08/09/15 0515 08/09/15 0530 08/09/15 0545 08/09/15 0600  BP: 115/91 121/97 111/94   Pulse: 141 134 109 131  Temp:      TempSrc:      Resp: 23 21 20 23   Height:      Weight:      SpO2: 98% 99% 99% 99%  Intake/Output Summary (Last 24 hours) at 08/09/15 0826 Last data filed at 08/09/15 0600  Gross per 24 hour  Intake    869 ml  Output      0 ml  Net    869 ml   Filed Weights    08/08/15 1659 08/08/15 2000 08/09/15 0400  Weight: 91.627 kg (202 lb) 90.5 kg (199 lb 8.3 oz) 90.5 kg (199 lb 8.3 oz)    Examination:  General exam: Appears calm and comfortable  Respiratory system: Clear to auscultation. Respiratory effort normal. Cardiovascular system: Irregular, irregular, with tachycardia. No pedal edema. Gastrointestinal system: Abdomen is mildly obese nondistended, soft and nontender. No organomegaly or masses felt. Normal bowel sounds heard. Central nervous system: Alert and oriented. No focal neurological deficits. Extremities: Symmetric 5 x 5 power. Skin: No rashes, lesions or ulcers Psychiatry: Judgement and insight appear normal. Mood & affect appropriate.     Data Reviewed: I have personally reviewed following labs and imaging studies  CBC:  Recent Labs Lab 08/08/15 1720  WBC 7.8  NEUTROABS 4.5  HGB 12.8  HCT 38.6  MCV 85.8  PLT [id number]   Basic Metabolic Panel:  Recent Labs Lab 08/08/15 1720 08/09/15 0240  NA 139 137  K 3.7 4.0  CL 106 108  CO2 23 16*  GLUCOSE 113* 123*  BUN 14 10  CREATININE 0.85 0.66  CALCIUM 9.5 8.6*   GFR: Estimated Creatinine Clearance: 69.8 mL/min (by C-G formula based on Cr of 0.66). Liver Function Tests: No results for input(s): AST, ALT, ALKPHOS, BILITOT, PROT, ALBUMIN in the last 168 hours. No results for input(s): LIPASE, AMYLASE in the last 168 hours. No results for input(s): AMMONIA in the last 168 hours. Coagulation Profile: No results for input(s): INR, PROTIME in the last 168 hours. Cardiac Enzymes:  Recent Labs Lab 08/08/15 1720 08/08/15 2143 08/09/15 0240  TROPONINI <0.03 <0.03 <0.03   BNP (last 3 results) No results for input(s): PROBNP in the last 8760 hours. HbA1C: No results for input(s): HGBA1C in the last 72 hours. CBG: No results for input(s): GLUCAP in the last 168 hours. Lipid Profile: No results for input(s): CHOL, HDL, LDLCALC, TRIG, CHOLHDL, LDLDIRECT in the last 72  hours. Thyroid Function Tests:  Recent Labs  08/08/15 1720  TSH 3.876   Anemia Panel: No results for input(s): VITAMINB12, FOLATE, FERRITIN, TIBC, IRON, RETICCTPCT in the last 72 hours. Sepsis Labs: No results for input(s): PROCALCITON, LATICACIDVEN in the last 168 hours.  Recent Results (from the past 240 hour(s))  MRSA PCR Screening     Status: None   Collection Time: 08/08/15  8:19 PM  Result Value Ref Range Status   MRSA by PCR NEGATIVE NEGATIVE Final    Comment:        The GeneXpert MRSA Assay (FDA approved for NASAL specimens only), is one component of a comprehensive MRSA colonization surveillance program. It is not intended to diagnose MRSA infection nor to guide or monitor treatment for MRSA infections.          Radiology Studies: Dg Chest Portable 1 View  08/08/2015  CLINICAL DATA:  Tachycardia EXAM: PORTABLE CHEST 1 VIEW COMPARISON:  09/29/2012 FINDINGS: Radiographic technique is inadequate. The image is over exposed and detail of the left upper lobe is lost. There is mild to moderate cardiac enlargement. The vascular pattern is normal. The left lower lobe appears clear while there is mild opacity in the medial right lower lobe. IMPRESSION: Mild to moderate cardiac enlargement with no evidence  of pulmonary edema. Right lower lobe opacity could represent infiltrate or atelectasis. Cannot evaluate left upper lobe due to inadequate radiographic technique. Electronically Signed   By: Skipper Cliche M.D.   On: 08/08/2015 18:43        Scheduled Meds: . azithromycin  500 mg Intravenous Q24H  . cefTRIAXone (ROCEPHIN)  IV  1 g Intravenous Q24H  . levothyroxine  112 mcg Oral QAC breakfast  . metoprolol succinate  25 mg Oral BID  . omega-3 acid ethyl esters  1 g Oral Daily  . rivaroxaban  20 mg Oral Q supper  . sodium chloride flush  3 mL Intravenous Q12H   Continuous Infusions: . sodium chloride 100 mL/hr at 08/09/15 0600  . diltiazem (CARDIZEM) infusion 10  mg/hr (08/09/15 0600)     LOS: 1 day    Time spent: 38 minutes    Rexene Alberts, MD [facility] Pager [phone]  If 7PM-7AM, please contact night-coverage [email or web address] Password [facility] 08/09/2015, 8:26 AM

## 2015-08-10 ENCOUNTER — Inpatient Hospital Stay (HOSPITAL_COMMUNITY): Payer: Medicare Other

## 2015-08-10 LAB — COMPREHENSIVE METABOLIC PANEL
ALK PHOS: 70 U/L (ref 38–126)
ALT: 14 U/L (ref 14–54)
AST: 15 U/L (ref 15–41)
Albumin: 3.8 g/dL (ref 3.5–5.0)
Anion gap: 8 (ref 5–15)
BUN: 10 mg/dL (ref 6–20)
CALCIUM: 9 mg/dL (ref 8.9–10.3)
CHLORIDE: 108 mmol/L (ref 101–111)
CO2: 23 mmol/L (ref 22–32)
CREATININE: 0.64 mg/dL (ref 0.44–1.00)
GFR calc Af Amer: >60 mL/min (ref >60)
Glucose, Bld: 115 mg/dL — ABNORMAL HIGH (ref 65–99)
Potassium: 3.6 mmol/L (ref 3.5–5.1)
SODIUM: 139 mmol/L (ref 135–145)
Total Bilirubin: 0.5 mg/dL (ref 0.3–1.2)
Total Protein: 6.8 g/dL (ref 6.5–8.1)

## 2015-08-10 MED ORDER — GI COCKTAIL ~~LOC~~
ORAL | Status: AC
  Filled 2015-08-10: qty 30

## 2015-08-10 MED ORDER — PANTOPRAZOLE SODIUM 40 MG PO TBEC
40.0000 mg | DELAYED_RELEASE_TABLET | Freq: Every day | ORAL | Status: DC
  Administered 2015-08-10 – 2015-08-14 (×5): 40 mg via ORAL
  Filled 2015-08-10 (×5): qty 1

## 2015-08-10 MED ORDER — LEVALBUTEROL HCL 0.63 MG/3ML IN NEBU
0.6300 mg | INHALATION_SOLUTION | Freq: Four times a day (QID) | RESPIRATORY_TRACT | Status: DC | PRN
  Administered 2015-08-10: 0.63 mg via RESPIRATORY_TRACT
  Filled 2015-08-10: qty 3

## 2015-08-10 MED ORDER — FUROSEMIDE 10 MG/ML IJ SOLN
60.0000 mg | Freq: Once | INTRAMUSCULAR | Status: AC
  Administered 2015-08-10: 60 mg via INTRAVENOUS
  Filled 2015-08-10: qty 6

## 2015-08-10 MED ORDER — ALUM & MAG HYDROXIDE-SIMETH 200-200-20 MG/5ML PO SUSP
15.0000 mL | ORAL | Status: DC | PRN
  Administered 2015-08-10: 15 mL via ORAL
  Filled 2015-08-10: qty 30

## 2015-08-10 MED ORDER — LORAZEPAM 2 MG/ML IJ SOLN
0.5000 mg | Freq: Once | INTRAMUSCULAR | Status: AC
  Administered 2015-08-11: 0.5 mg via INTRAVENOUS
  Filled 2015-08-10: qty 1

## 2015-08-10 MED ORDER — GI COCKTAIL ~~LOC~~
30.0000 mL | Freq: Once | ORAL | Status: AC
  Administered 2015-08-10: 30 mL via ORAL
  Filled 2015-08-10: qty 30

## 2015-08-10 MED ORDER — ZOLPIDEM TARTRATE 5 MG PO TABS
5.0000 mg | ORAL_TABLET | Freq: Once | ORAL | Status: AC
  Administered 2015-08-10: 5 mg via ORAL
  Filled 2015-08-10: qty 1

## 2015-08-10 MED ORDER — MORPHINE SULFATE (PF) 2 MG/ML IV SOLN
2.0000 mg | Freq: Once | INTRAVENOUS | Status: AC
  Administered 2015-08-10: 2 mg via INTRAVENOUS
  Filled 2015-08-10: qty 1

## 2015-08-10 NOTE — Progress Notes (Addendum)
CTSP: Patient complaining of new onset difficulty breathing and experiencing pain in the epigastric area.  She has been experiencing abd pain all day. Had a normal BM earlier today. Her vitals have been normal.  Per nurse, she was restless last night, and has since worsened. She has been unable to lie down. Given her IVF bolus over the past few days, concern about volume overload.  She is not in respiratory distress at this time.  Will obtain CXR, and labs. Abdominal is benign, will obtain KUB and CBC.  If she drops her Hct significantly, then will obtain abdominal CT.  Will d/c IVF, give Lasix and Morphine. Follow clinically.  Will continue Amiodarone for now, though she could have side effects from it.    Updated plan of care with Daughter and grand-daughter at bedside.   By signing my name below, I, Delene Ruffini, attest that this documentation has been prepared under the direction and in the presence of Orvan Falconer, MD. Rosalita Chessman.  Electronically Signed: Delene Ruffini, Scribe 08/10/2015 10:25pm  ADDENDUM:  Follow up not:  serology was unremarkable. Hb dropped to 11g/dL.  K is a little low, will supplement. CXR confirmed vascular congestion.   KUB showed no acute process.  She responded to IV Lasix and Bipap, and is now comfortable.    Orvan Falconer, MD FACP.

## 2015-08-10 NOTE — Progress Notes (Signed)
Patient has continued to be anxious and increased SOB. Her saturations have fluctuated along with her respirations and I had to increase her O2 levels from 2lpm to 4lpm. Patient was getting a chest X-ray  when she became  anxious and her saturation began to drop into the 80s. Patient was placed on BIPAP and a foley was placed flowing her receiving lasix. Patient has calmed down and rest now and will continue to monitor her status.

## 2015-08-10 NOTE — Progress Notes (Signed)
Pt complaining of feeling of fullness or swelling to stomach. Says its been going on for about 3 months but feels its gotten worse recently.

## 2015-08-10 NOTE — Progress Notes (Signed)
PROGRESS NOTE    [name] Fry  [id number] DOB: Apr 16, 1945 DOA: 08/08/2015 PCP: [name], MD    Brief Narrative:  Patient is a 70 year old woman with a history of chronic atrial fibrillation on Xarelto, nonobstructive CAD with previous stenting, and hypothyroidism, who presented from her PCPs office when she was found to be in atrial fibrillation with rapid ventricular response. The patient had mild shortness of breath, otherwise she reported no chest pain during the RVR. She recently underwent a heart catheterization on 07/26/15 which revealed a patent stent and nonobstructive CAD. In the ED, she was tachycardic with a heart rate ranging from 120s to the 150s. Her EKG revealed atrial fibrillation with rapid ventricular response. Her chest x-ray revealed right lower lobe opacity which could reflect infiltrate or atelectasis; mild to moderate cardiac enlargement with no evidence of pulmonary edema. Her initial blood work was unremarkable including a negative troponin I. She was admitted for further evaluation and management. Cardiology was consulted to assist with management of A. fib with RVR.   Assessment & Plan:   Principal Problem:   Atrial fibrillation with RVR (HCC) Active Problems:   Coronary atherosclerosis of native coronary artery   CAP (community acquired pneumonia)   Hypotension   Hyperlipidemia   Hypothyroidism   Metabolic acidosis   1. Chronic atrial fibrillation with RVR. Patient was restarted on Xarelto. Diltiazem drip was started. Toprol-XL was restarted. Amlodipine was held. The patient had become hypotensive with a blood pressure in the 0000000 systolically following admission. She was bolused IV fluids and diltiazem drip and Toprol-XL were withheld. Her blood pressure improved and diltiazem and Toprol were restarted. -Cardiology was consulted. They started amiodarone loading. Her heart rate is trending down, but still is requiring amiodarone and diltiazem drips. Blood  pressure currently stable. Cardiology is considering TEE guided cardioversion next week. -Her TSH was within normal limits.  Coronary artery disease with previous stenting. Patient underwent cardiac catheterization on 07/26/15 and revealed nonobstructive CAD and a patent stent in the left circumflex. Her EF was within normal limits at 55-60%. Patient does not report chest pain. Her troponin I has been negative 3. Continue medical management.  Hypertension. The patient is treated chronically with Toprol-XL and amlodipine. As stated above, she became hypotensive on the diltiazem drip following admission. We'll continue to hold Norvasc. Toprol-XL was restarted as tolerated. Her blood pressure has been stable.  Community-acquired pneumonia. Patient's chest x-ray on admission revealed an infiltrate versus atelectasis. She has had a mild cough at home, but no subjective fever. Azithromycin and Rocephin were started.  Hypothyroidism. The patient was restarted on Synthroid. Her TSH is within normal limits.  Metabolic acidosis. Patient's CO2 fell to 16. Etiology was unknown. Follow-up basic metabolic panel revealed resolution in her CO2. Query lab error.   DVT prophylaxis: Xarelto Code Status: Full code Family Communication: Discussed with daughter Disposition Plan: Discharge with clinically appropriate, likely in 2-3 days.   Consultants:   Cardiology  Procedures:   None  Antimicrobials: (specify start and planned stop date. Auto populated tables are space occupying and do not give end dates)  Azithromycin 08/08/15>   Rocephin 08/08/15>    Subjective: Patient is sitting up in the chair, without complaints of chest pain or shortness of breath at rest. She complains of indigestion.  Objective: Filed Vitals:   08/10/15 0600 08/10/15 0615 08/10/15 0700 08/10/15 0715  BP: 106/74 102/82 110/74 103/66  Pulse: 120 115 115 112  Temp:      TempSrc:  Resp: 26 27 31 21   Height:        Weight:      SpO2: 98% 99% 99% 99%   temperature 97.4.  Intake/Output Summary (Last 24 hours) at 08/10/15 0743 Last data filed at 08/09/15 1800  Gross per 24 hour  Intake 2762.46 ml  Output      0 ml  Net 2762.46 ml   Filed Weights   08/08/15 2000 08/09/15 0400 08/10/15 0400  Weight: 90.5 kg (199 lb 8.3 oz) 90.5 kg (199 lb 8.3 oz) 96.4 kg (212 lb 8.4 oz)    Examination:  General exam: Appears calm and comfortable  Respiratory system: Breathing nonlabored at rest. Few crackles on the right. Cardiovascular system: Irregular, irregular, with borderline tachycardia. No pedal edema. Gastrointestinal system: Abdomen is mildly obese nondistended, soft and nontender. No organomegaly or masses felt. Normal bowel sounds heard. Central nervous system: Alert and oriented. No focal neurological deficits. Extremities: Symmetric 5 x 5 power. Skin: No rashes, lesions or ulcers Psychiatry: Judgement and insight appear normal. Mood & affect appropriate.     Data Reviewed: I have personally reviewed following labs and imaging studies  CBC:  Recent Labs Lab 08/08/15 1720 08/09/15 0840  WBC 7.8 6.8  NEUTROABS 4.5  --   HGB 12.8 11.7*  HCT 38.6 35.5*  MCV 85.8 86.6  PLT 311 [id number]   Basic Metabolic Panel:  Recent Labs Lab 08/08/15 1720 08/09/15 0240 08/09/15 1346 08/10/15 0458  NA 139 137 137 139  K 3.7 4.0 3.6 3.6  CL 106 108 108 108  CO2 23 16* 22 23  GLUCOSE 113* 123* 123* 115*  BUN 14 10 11 10   CREATININE 0.85 0.66 0.58 0.64  CALCIUM 9.5 8.6* 9.1 9.0   GFR: Estimated Creatinine Clearance: 72.3 mL/min (by C-G formula based on Cr of 0.64). Liver Function Tests:  Recent Labs Lab 08/10/15 0458  AST 15  ALT 14  ALKPHOS 70  BILITOT 0.5  PROT 6.8  ALBUMIN 3.8   No results for input(s): LIPASE, AMYLASE in the last 168 hours. No results for input(s): AMMONIA in the last 168 hours. Coagulation Profile: No results for input(s): INR, PROTIME in the last 168  hours. Cardiac Enzymes:  Recent Labs Lab 08/08/15 1720 08/08/15 2143 08/09/15 0240 08/09/15 0840  TROPONINI <0.03 <0.03 <0.03 <0.03   BNP (last 3 results) No results for input(s): PROBNP in the last 8760 hours. HbA1C: No results for input(s): HGBA1C in the last 72 hours. CBG: No results for input(s): GLUCAP in the last 168 hours. Lipid Profile: No results for input(s): CHOL, HDL, LDLCALC, TRIG, CHOLHDL, LDLDIRECT in the last 72 hours. Thyroid Function Tests:  Recent Labs  08/08/15 1720  TSH 3.876   Anemia Panel: No results for input(s): VITAMINB12, FOLATE, FERRITIN, TIBC, IRON, RETICCTPCT in the last 72 hours. Sepsis Labs: No results for input(s): PROCALCITON, LATICACIDVEN in the last 168 hours.  Recent Results (from the past 240 hour(s))  MRSA PCR Screening     Status: None   Collection Time: 08/08/15  8:19 PM  Result Value Ref Range Status   MRSA by PCR NEGATIVE NEGATIVE Final    Comment:        The GeneXpert MRSA Assay (FDA approved for NASAL specimens only), is one component of a comprehensive MRSA colonization surveillance program. It is not intended to diagnose MRSA infection nor to guide or monitor treatment for MRSA infections.          Radiology Studies: Dg Chest Portable 1  View  08/08/2015  CLINICAL DATA:  Tachycardia EXAM: PORTABLE CHEST 1 VIEW COMPARISON:  09/29/2012 FINDINGS: Radiographic technique is inadequate. The image is over exposed and detail of the left upper lobe is lost. There is mild to moderate cardiac enlargement. The vascular pattern is normal. The left lower lobe appears clear while there is mild opacity in the medial right lower lobe. IMPRESSION: Mild to moderate cardiac enlargement with no evidence of pulmonary edema. Right lower lobe opacity could represent infiltrate or atelectasis. Cannot evaluate left upper lobe due to inadequate radiographic technique. Electronically Signed   By: Skipper Cliche M.D.   On: 08/08/2015 18:43         Scheduled Meds: . azithromycin  500 mg Intravenous Q24H  . cefTRIAXone (ROCEPHIN)  IV  1 g Intravenous Q24H  . levothyroxine  112 mcg Oral QAC breakfast  . metoprolol succinate  25 mg Oral BID  . omega-3 acid ethyl esters  1 g Oral Daily  . rivaroxaban  20 mg Oral Q supper  . sodium chloride flush  3 mL Intravenous Q12H   Continuous Infusions: . sodium chloride 100 mL/hr at 08/10/15 0630  . amiodarone 30 mg/hr (08/10/15 0223)  . diltiazem (CARDIZEM) infusion 10 mg/hr (08/09/15 2327)     LOS: 2 days    Time spent: 4 minutes    Rexene Alberts, MD [facility] Pager [phone]  If 7PM-7AM, please contact night-coverage [email or web address] Password [facility] 08/10/2015, 7:43 AM

## 2015-08-11 ENCOUNTER — Inpatient Hospital Stay (HOSPITAL_COMMUNITY): Payer: Medicare Other

## 2015-08-11 DIAGNOSIS — I4891 Unspecified atrial fibrillation: Secondary | ICD-10-CM

## 2015-08-11 LAB — COMPREHENSIVE METABOLIC PANEL
ALBUMIN: 3.7 g/dL (ref 3.5–5.0)
ALT: 16 U/L (ref 14–54)
ANION GAP: 8 (ref 5–15)
AST: 21 U/L (ref 15–41)
Alkaline Phosphatase: 70 U/L (ref 38–126)
BILIRUBIN TOTAL: 0.4 mg/dL (ref 0.3–1.2)
BUN: 9 mg/dL (ref 6–20)
CHLORIDE: 106 mmol/L (ref 101–111)
CO2: 23 mmol/L (ref 22–32)
Calcium: 8.8 mg/dL — ABNORMAL LOW (ref 8.9–10.3)
Creatinine, Ser: 0.74 mg/dL (ref 0.44–1.00)
GFR calc Af Amer: >60 mL/min (ref >60)
GFR calc non Af Amer: >60 mL/min (ref >60)
GLUCOSE: 140 mg/dL — AB (ref 65–99)
POTASSIUM: 3.3 mmol/L — AB (ref 3.5–5.1)
SODIUM: 137 mmol/L (ref 135–145)
TOTAL PROTEIN: 6.7 g/dL (ref 6.5–8.1)

## 2015-08-11 LAB — CBC
HEMATOCRIT: 34.7 % — AB (ref 36.0–46.0)
HEMATOCRIT: 33.7 % — AB (ref 36.0–46.0)
HEMOGLOBIN: 10.9 g/dL — AB (ref 12.0–15.0)
Hemoglobin: 11.4 g/dL — ABNORMAL LOW (ref 12.0–15.0)
MCH: 28.3 pg (ref 26.0–34.0)
MCH: 28.6 pg (ref 26.0–34.0)
MCHC: 32.9 g/dL (ref 30.0–36.0)
MCHC: 32.3 g/dL (ref 30.0–36.0)
MCV: 87 fL (ref 78.0–100.0)
MCV: 87.5 fL (ref 78.0–100.0)
PLATELETS: 262 10*3/uL (ref 150–400)
Platelets: 262 10*3/uL (ref 150–400)
RBC: 3.85 MIL/uL — ABNORMAL LOW (ref 3.87–5.11)
RBC: 3.99 MIL/uL (ref 3.87–5.11)
RDW: 14.4 % (ref 11.5–15.5)
RDW: 14.2 % (ref 11.5–15.5)
WBC: 9.4 10*3/uL (ref 4.0–10.5)
WBC: 8.9 10*3/uL (ref 4.0–10.5)

## 2015-08-11 LAB — ECHOCARDIOGRAM COMPLETE
HEIGHTINCHES: 63 in
WEIGHTICAEL: 3375.68 [oz_av]

## 2015-08-11 LAB — MAGNESIUM: Magnesium: 1.7 mg/dL (ref 1.7–2.4)

## 2015-08-11 MED ORDER — DILTIAZEM HCL 30 MG PO TABS
30.0000 mg | ORAL_TABLET | Freq: Three times a day (TID) | ORAL | Status: DC
  Administered 2015-08-11 – 2015-08-12 (×3): 30 mg via ORAL
  Filled 2015-08-11 (×3): qty 1

## 2015-08-11 MED ORDER — DILTIAZEM HCL 30 MG PO TABS: 30.0000 mg | ORAL_TABLET | Freq: Three times a day (TID) | ORAL | Status: DC

## 2015-08-11 MED ORDER — LEVOFLOXACIN 750 MG PO TABS
750.0000 mg | ORAL_TABLET | Freq: Every day | ORAL | Status: AC
  Administered 2015-08-11 – 2015-08-12 (×2): 750 mg via ORAL
  Filled 2015-08-11 (×2): qty 1

## 2015-08-11 MED ORDER — POTASSIUM CHLORIDE CRYS ER 20 MEQ PO TBCR
40.0000 meq | EXTENDED_RELEASE_TABLET | Freq: Once | ORAL | Status: AC
  Administered 2015-08-11: 40 meq via ORAL
  Filled 2015-08-11: qty 2

## 2015-08-11 MED ORDER — FUROSEMIDE 10 MG/ML IJ SOLN
20.0000 mg | Freq: Two times a day (BID) | INTRAMUSCULAR | Status: DC
  Administered 2015-08-11 – 2015-08-13 (×5): 20 mg via INTRAVENOUS
  Filled 2015-08-11 (×5): qty 2

## 2015-08-11 NOTE — Progress Notes (Signed)
PROGRESS NOTE    [name] Fry  [id number] DOB: 05/06/1945 DOA: 08/08/2015 PCP: [name], MD    Brief Narrative:  Patient is a 70 year old woman with a history of chronic atrial fibrillation on Xarelto, nonobstructive CAD with previous stenting, and hypothyroidism, who presented from her PCPs office when she was found to be in atrial fibrillation with rapid ventricular response. The patient had mild shortness of breath, otherwise she reported no chest pain during the RVR. She recently underwent a heart catheterization on 07/26/15 which revealed a patent stent and nonobstructive CAD. In the ED, she was tachycardic with a heart rate ranging from 120s to the 150s. Her EKG revealed atrial fibrillation with rapid ventricular response. Her chest x-ray revealed right lower lobe opacity which could reflect infiltrate or atelectasis; mild to moderate cardiac enlargement with no evidence of pulmonary edema. Her initial blood work was unremarkable including a negative troponin I. She was admitted for further evaluation and management. Cardiology was consulted to assist with management of A. fib with RVR. On the night of 5/27 she had acute respiratory distress and was found to have pulmonary edema on chest x-ray. Has been started on IV Lasix and echocardiogram is pending.   Assessment & Plan:   Principal Problem:   Atrial fibrillation with RVR (HCC) Active Problems:   Hyperlipidemia   Hypothyroidism   Coronary atherosclerosis of native coronary artery   CAP (community acquired pneumonia)   Hypotension   Metabolic acidosis   Chronic atrial fibrillation with RVR. -Has converted to sinus rhythm as of this morning. Still tachycardic in the 110s. -Continue amiodarone drip, start transitioning Cardizem to by mouth, continue Toprol XL. -TSH within normal limits, will replete potassium. -Seen by cardiology with plans for potential cardioversion later this week.  Acute hypoxemic respiratory  failure -Due to pulmonary vascular congestion likely in the face of A. fib with RVR. -Given Lasix overnight with 2.5 L of diuresis. She remains overall positive. -Continue Lasix 20 mg IV twice daily, will check 2-D echocardiogram to assess systolic and diastolic function.  Coronary artery disease with previous stenting. Patient underwent cardiac catheterization on 07/26/15 and revealed nonobstructive CAD and a patent stent in the left circumflex. Her EF was within normal limits at 55-60%. Patient does not report chest pain. Her troponin I has been negative 3. Continue medical management.  Hypertension. -Well-controlled, in fact with an episode of hypotension earlier this admission.  ?Community-acquired pneumonia. -Patient's chest x-ray on admission revealed an infiltrate versus atelectasis.  -In absence of cough fever and leukocytosis, I believe pneumonia is less likely, however will continue treatment for 5 days. -We'll transition over to Levaquin for 2 more days of treatment.  Hypothyroidism. -TSH within normal limits. -Continue Synthroid.    DVT prophylaxis: Xarelto Code Status: Full code Family Communication: Discussed with daughter Disposition Plan: Discharge with clinically appropriate, likely in 2-3 days.   Consultants:   Cardiology  Procedures:   None  Antimicrobials:  Levaquin  Subjective: Lying in bed, feels well, had episode of shortness of breath last night.  Objective: Filed Vitals:   08/11/15 0600 08/11/15 0700 08/11/15 0800 08/11/15 0900  BP: 110/75 96/70 115/86 117/82  Pulse: 115 120 101 106  Temp:      TempSrc:      Resp: 17 17 30 20   Height:      Weight:      SpO2: 97% 98% 95% 98%   temperature 97.4.  Intake/Output Summary (Last 24 hours) at 08/11/15 [id number] Last data filed  at 08/11/15 0500  Gross per 24 hour  Intake    480 ml  Output   2300 ml  Net  -1820 ml   Filed Weights   08/09/15 0400 08/10/15 0400 08/11/15 0500  Weight: 90.5 kg  (199 lb 8.3 oz) 96.4 kg (212 lb 8.4 oz) 95.7 kg (210 lb 15.7 oz)    Examination:  General exam: Appears calm and comfortable  Respiratory system: Breathing nonlabored at rest. Few crackles on the right. Cardiovascular system: Irregular, irregular, with borderline tachycardia. No pedal edema. Gastrointestinal system: Abdomen is mildly obese nondistended, soft and nontender. No organomegaly or masses felt. Normal bowel sounds heard. Central nervous system: Alert and oriented. No focal neurological deficits. Extremities: Symmetric 5 x 5 power,1+ pitting edema bilaterally Skin: No rashes, lesions or ulcers Psychiatry: Judgement and insight appear normal. Mood & affect appropriate.     Data Reviewed: I have personally reviewed following labs and imaging studies  CBC:  Recent Labs Lab 08/08/15 1720 08/09/15 0840 08/11/15 0033 08/11/15 0505  WBC 7.8 6.8 9.4 8.9  NEUTROABS 4.5  --   --   --   HGB 12.8 11.7* 10.9* 11.4*  HCT 38.6 35.5* 33.7* 34.7*  MCV 85.8 86.6 87.5 87.0  PLT 311 283 262 [id number]   Basic Metabolic Panel:  Recent Labs Lab 08/08/15 1720 08/09/15 0240 08/09/15 1346 08/10/15 0458 08/11/15 0033  NA 139 137 137 139 137  K 3.7 4.0 3.6 3.6 3.3*  CL 106 108 108 108 106  CO2 23 16* 22 23 23   GLUCOSE 113* 123* 123* 115* 140*  BUN 14 10 11 10 9   CREATININE 0.85 0.66 0.58 0.64 0.74  CALCIUM 9.5 8.6* 9.1 9.0 8.8*   GFR: Estimated Creatinine Clearance: 72 mL/min (by C-G formula based on Cr of 0.74). Liver Function Tests:  Recent Labs Lab 08/10/15 0458 08/11/15 0033  AST 15 21  ALT 14 16  ALKPHOS 70 70  BILITOT 0.5 0.4  PROT 6.8 6.7  ALBUMIN 3.8 3.7   No results for input(s): LIPASE, AMYLASE in the last 168 hours. No results for input(s): AMMONIA in the last 168 hours. Coagulation Profile: No results for input(s): INR, PROTIME in the last 168 hours. Cardiac Enzymes:  Recent Labs Lab 08/08/15 1720 08/08/15 2143 08/09/15 0240 08/09/15 0840  TROPONINI  <0.03 <0.03 <0.03 <0.03   BNP (last 3 results) No results for input(s): PROBNP in the last 8760 hours. HbA1C: No results for input(s): HGBA1C in the last 72 hours. CBG: No results for input(s): GLUCAP in the last 168 hours. Lipid Profile: No results for input(s): CHOL, HDL, LDLCALC, TRIG, CHOLHDL, LDLDIRECT in the last 72 hours. Thyroid Function Tests:  Recent Labs  08/08/15 1720  TSH 3.876   Anemia Panel: No results for input(s): VITAMINB12, FOLATE, FERRITIN, TIBC, IRON, RETICCTPCT in the last 72 hours. Sepsis Labs: No results for input(s): PROCALCITON, LATICACIDVEN in the last 168 hours.  Recent Results (from the past 240 hour(s))  MRSA PCR Screening     Status: None   Collection Time: 08/08/15  8:19 PM  Result Value Ref Range Status   MRSA by PCR NEGATIVE NEGATIVE Final    Comment:        The GeneXpert MRSA Assay (FDA approved for NASAL specimens only), is one component of a comprehensive MRSA colonization surveillance program. It is not intended to diagnose MRSA infection nor to guide or monitor treatment for MRSA infections.          Radiology Studies: Dg Chest  1 View  08/11/2015  CLINICAL DATA:  Acute onset of shortness of breath and abdominal distention. Initial encounter. EXAM: CHEST 1 VIEW COMPARISON:  Chest radiograph performed 08/08/2015 FINDINGS: The lungs are well-aerated. Small bilateral pleural effusions are noted, left greater than right. Mild bibasilar airspace opacities may reflect interstitial edema, given vascular congestion. There is no evidence of pneumothorax. The cardiomediastinal silhouette is borderline normal in size. No acute osseous abnormalities are seen. Scattered clips are seen at the left axilla. IMPRESSION: Small bilateral pleural effusions, left greater than right. Mild bibasilar airspace opacities may reflect interstitial edema, given vascular congestion. Electronically Signed   By: Garald Balding M.D.   On: 08/11/2015 00:03   Dg Abd  1 View  08/11/2015  CLINICAL DATA:  Acute onset of worsening shortness of breath and abdominal distention. Initial encounter. EXAM: ABDOMEN - 1 VIEW COMPARISON:  None. FINDINGS: The visualized bowel gas pattern is unremarkable. There is a relative paucity of bowel gas within the abdomen. Scattered air and stool filled loops of colon are seen; no abnormal dilatation of small bowel loops is seen to suggest small bowel obstruction. No free intra-abdominal air is identified, though evaluation for free air is limited on a single supine view. The visualized osseous structures are within normal limits; the sacroiliac joints are unremarkable in appearance. Small bilateral pleural effusions are seen, left greater than right. IMPRESSION: Unremarkable bowel gas pattern; no free intra-abdominal air seen. Relative paucity of bowel gas within the abdomen. Electronically Signed   By: Garald Balding M.D.   On: 08/11/2015 00:04        Scheduled Meds: . furosemide  20 mg Intravenous BID  . levothyroxine  112 mcg Oral QAC breakfast  . metoprolol succinate  25 mg Oral BID  . omega-3 acid ethyl esters  1 g Oral Daily  . pantoprazole  40 mg Oral Daily  . potassium chloride  40 mEq Oral Once  . rivaroxaban  20 mg Oral Q supper  . sodium chloride flush  3 mL Intravenous Q12H   Continuous Infusions: . amiodarone 30 mg/hr (08/11/15 0741)  . diltiazem (CARDIZEM) infusion 5 mg/hr (08/11/15 0741)     LOS: 3 days    Time spent: 30 minutes    Lelon Frohlich, MD [facility] Pager [phone]  If 7PM-7AM, please contact night-coverage [email or web address] Password [facility] 08/11/2015, 9:55 AM

## 2015-08-11 NOTE — Progress Notes (Signed)
Pharmacy Antibiotic Note  [name] Fry is a 70 y.o. female admitted on 08/08/2015 with pneumonia.  Pharmacy has been consulted for levaquin dosing to complete 5 days therpay  Plan: Levaquin 750 mg po daily for 2 days Pharmacy will sign off.  Please advise if we can be of further assistance  Height: 5\' 3"  (160 cm) Weight: 210 lb 15.7 oz (95.7 kg) IBW/kg (Calculated) : 52.4  Temp (24hrs), Avg:97.8 F (36.6 C), Min:97.2 F (36.2 C), Max:98.3 F (36.8 C)   Recent Labs Lab 08/08/15 1720 08/09/15 0240 08/09/15 0840 08/09/15 1346 08/10/15 0458 08/11/15 0033 08/11/15 0505  WBC 7.8  --  6.8  --   --  9.4 8.9  CREATININE 0.85 0.66  --  0.58 0.64 0.74  --     Estimated Creatinine Clearance: 72 mL/min (by C-G formula based on Cr of 0.74).    Allergies  Allergen Reactions  . Doxycycline Rash     Thank you for allowing pharmacy to be a part of this patient's care.  Excell Seltzer Poteet 08/11/2015 10:14 AM

## 2015-08-11 NOTE — Progress Notes (Signed)
*  PRELIMINARY RESULTS* Echocardiogram 2D Echocardiogram has been performed.  [name] Fry 08/11/2015, 3:31 PM

## 2015-08-12 DIAGNOSIS — I5021 Acute systolic (congestive) heart failure: Secondary | ICD-10-CM | POA: Diagnosis not present

## 2015-08-12 LAB — BASIC METABOLIC PANEL
Anion gap: 7 (ref 5–15)
BUN: 13 mg/dL (ref 6–20)
CALCIUM: 8.9 mg/dL (ref 8.9–10.3)
CO2: 28 mmol/L (ref 22–32)
Chloride: 102 mmol/L (ref 101–111)
Creatinine, Ser: 0.9 mg/dL (ref 0.44–1.00)
GFR calc Af Amer: >60 mL/min (ref >60)
Glucose, Bld: 97 mg/dL (ref 65–99)
POTASSIUM: 3.5 mmol/L (ref 3.5–5.1)
SODIUM: 137 mmol/L (ref 135–145)

## 2015-08-12 LAB — CBC
HEMATOCRIT: 32.9 % — AB (ref 36.0–46.0)
Hemoglobin: 11.1 g/dL — ABNORMAL LOW (ref 12.0–15.0)
MCH: 29.1 pg (ref 26.0–34.0)
MCHC: 33.7 g/dL (ref 30.0–36.0)
MCV: 86.4 fL (ref 78.0–100.0)
PLATELETS: 222 10*3/uL (ref 150–400)
RBC: 3.81 MIL/uL — ABNORMAL LOW (ref 3.87–5.11)
RDW: 14.1 % (ref 11.5–15.5)
WBC: 6.8 10*3/uL (ref 4.0–10.5)

## 2015-08-12 MED ORDER — TRAZODONE HCL 50 MG PO TABS: 50.0000 mg | ORAL_TABLET | Freq: Every evening | ORAL | Status: DC | PRN

## 2015-08-12 MED ORDER — POTASSIUM CHLORIDE CRYS ER 20 MEQ PO TBCR
30.0000 meq | EXTENDED_RELEASE_TABLET | Freq: Two times a day (BID) | ORAL | Status: DC
  Administered 2015-08-12 – 2015-08-13 (×3): 30 meq via ORAL
  Filled 2015-08-12 (×3): qty 1

## 2015-08-12 NOTE — Progress Notes (Signed)
PROGRESS NOTE    [name] Fry  [id number] DOB: Apr 17, 1945 DOA: 08/08/2015 PCP: [name], MD    Brief Narrative:  Patient is a 70 year old woman with a history of chronic atrial fibrillation on Xarelto, nonobstructive CAD with previous stenting, and hypothyroidism, who presented from her PCPs office when she was found to be in atrial fibrillation with rapid ventricular response. The patient had mild shortness of breath, otherwise she reported no chest pain during the RVR. She recently underwent a heart catheterization on 07/26/15 which revealed a patent stent and nonobstructive CAD. In the ED, she was tachycardic with a heart rate ranging from 120s to the 150s. Her EKG revealed atrial fibrillation with rapid ventricular response. Her chest x-ray revealed right lower lobe opacity which could reflect infiltrate or atelectasis; mild to moderate cardiac enlargement with no evidence of pulmonary edema. Her initial blood work was unremarkable including a negative troponin I. She was admitted for further evaluation and management. Cardiology was consulted to assist with management of A. fib with RVR.   Assessment & Plan:   Principal Problem:   Atrial fibrillation with RVR (HCC) Active Problems:   Coronary atherosclerosis of native coronary artery   CAP (community acquired pneumonia)   Hypotension   Acute systolic CHF (congestive heart failure) (HCC)   Hyperlipidemia   Hypothyroidism   Metabolic acidosis   1. Chronic atrial fibrillation with RVR. Patient was restarted on Xarelto. Diltiazem drip was started. Toprol-XL was restarted. Amlodipine was held. The patient had become hypotensive with a blood pressure in the 0000000 systolically following admission. She was bolused IV fluids and diltiazem drip and Toprol-XL were withheld. Her blood pressure improved and diltiazem and Toprol were restarted. -Her TSH was within normal limits. -Cardiology was consulted. They started amiodarone loading.  Cardiology is considering TEE guided cardioversion this week. -Patient's heart rate has trended down and is intermittently in the bradycardic range. Follow-up EKG 5/29 reveal sinus rhythm with blocked PACs and left axis deviation-heart rate 61 bpm. -Amiodarone drip and Cardizem drip DC'd over the weekend. -We'll continue metoprolol 25 mg twice a day and cautiously with Cardizem 30 mg every 8 hours with parameters.  Acute systolic congestive heart failure. Patient developed some respiratory distress on early 08/11/15. Chest x-ray revealed small bilateral pleural effusions right greater than left, possible interstitial edema. -Patient was started on IV Lasix. She was diuresed over 3 L. -Echo on 06/11/15 revealed an EF of 35-40% with global hypokinesis and inferior akinesis of the left ventricle. Apparently there has been a decrease in her EF along with wall motion abnormality compared to cardiac catheterization on 07/26/15. -She is already on anticoagulation and a beta blocker. We'll consider starting an ACE inhibitor, but the patient's blood pressures have been on the low-normal side. -We'll await cardiology follow-up management recommendations.  Coronary artery disease with previous stenting. Patient underwent cardiac catheterization on 07/26/15 and revealed nonobstructive CAD and a patent stent in the left circumflex. Her EF was within normal limits at 55-60%. Patient does not report chest pain. Her troponin I has been negative 3. Continue medical management.  Hypertension. The patient is treated chronically with Toprol-XL and amlodipine. As stated above, she became hypotensive on the diltiazem drip following admission. We'll continue to hold Norvasc. Toprol-XL was restarted as tolerated. Her blood pressure has been stable.  Community-acquired pneumonia. Patient's chest x-ray on admission revealed an infiltrate versus atelectasis. She has had a mild cough at home, but no subjective fever.  Azithromycin and Rocephin were started. Antibiotic  changed to monotherapy with Levaquin. She remains afebrile. -Antibiotic course completed.  Hypothyroidism. The patient was restarted on Synthroid. Her TSH is within normal limits.  Metabolic acidosis. Patient's CO2 fell to 16. Etiology was unknown. Follow-up basic metabolic panel revealed resolution in her CO2. Query lab error.   DVT prophylaxis: Xarelto Code Status: Full code Family Communication: Discussed with daughter Disposition Plan: Discharge with clinically appropriate.   Consultants:   Cardiology  Procedures:  ECHO 08/11/15:Study Conclusions - Left ventricle: The cavity size was normal. Wall thickness was  increased in a pattern of moderate LVH. Systolic function was  moderately reduced. The estimated ejection fraction was in the  range of 35% to 40%. Global hypokinesis with inferior akinesis,  incoordinate septal motion. The study is not technically  sufficient to allow evaluation of LV diastolic function. Ejection  fraction (MOD, 2-plane): 40%. - Aortic valve: Trileaflet. Sclerosis without stenosis. There was  trivial regurgitation. - Mitral valve: Calcified annulus. Mildly thickened leaflets .  There was moderate regurgitation. - Left atrium: The atrium was normal in size. - Right ventricle: The cavity size was normal. - Right atrium: The atrium was mildly dilated. - Tricuspid valve: There was moderate regurgitation. - Pulmonary arteries: PA peak pressure: 30 mm Hg (S) + RAP. - Systemic veins: Not visualized. - Pericardium, extracardiac: A trivial pericardial effusion was  identified posterior to the heart. There was a left pleural  effusion. Impressions: - LVEF 35-40%, moderate LVH, global hypokinesis with inferior  akinesis and incoordinate septal motion, aortic valve sclerosis  with trivial AI, mitral annular calcification with moderate MR,  moderate TR, RVSP 30 mmHG + right atrial pressure,  IVC not  visualized, trivial posterior pericardial effusion and left   pleural effusion.  Antimicrobials: (specify start and planned stop date. Auto populated tables are space occupying and do not give end dates)  Levaquin 5/27>> 08/12/15  Azithromycin 08/08/15> 08/10/15  Rocephin 08/08/15> 08/10/15   Subjective: Patient is sitting up in the chair, without complaints of chest pain. She is less short of breath.  Objective: Filed Vitals:   08/12/15 0630 08/12/15 0644 08/12/15 0745 08/12/15 0749  BP: 109/61 109/61  119/79  Pulse: 65   80  Temp:   98 F (36.7 C)   TempSrc:      Resp: 17     Height:      Weight:      SpO2: 98%      temperature 97.4.  Intake/Output Summary (Last 24 hours) at 08/12/15 1034 Last data filed at 08/12/15 1033  Gross per 24 hour  Intake  991.5 ml  Output   4150 ml  Net -3158.5 ml   Filed Weights   08/09/15 0400 08/10/15 0400 08/11/15 0500  Weight: 90.5 kg (199 lb 8.3 oz) 96.4 kg (212 lb 8.4 oz) 95.7 kg (210 lb 15.7 oz)    Examination:  General exam: Appears calm and comfortable  Respiratory system: Breathing nonlabored at rest. Few crackles In the bases. Cardiovascular system: Irregular, irregular, with borderline tachycardia. No pedal edema. Gastrointestinal system: Abdomen is mildly obese nondistended, soft and nontender. No organomegaly or masses felt. Normal bowel sounds heard. Central nervous system: Alert and oriented. No focal neurological deficits. Extremities: Symmetric 5 x 5 power. Skin: No rashes, lesions or ulcers Psychiatry: Judgement and insight appear normal. Mood & affect appropriate.     Data Reviewed: I have personally reviewed following labs and imaging studies  CBC:  Recent Labs Lab 08/08/15 1720 08/09/15 0840 08/11/15 0033 08/11/15 0505 08/12/15  0435  WBC 7.8 6.8 9.4 8.9 6.8  NEUTROABS 4.5  --   --   --   --   HGB 12.8 11.7* 10.9* 11.4* 11.1*  HCT 38.6 35.5* 33.7* 34.7* 32.9*  MCV 85.8 86.6 87.5 87.0 86.4    PLT 311 283 262 262 [id number]   Basic Metabolic Panel:  Recent Labs Lab 08/09/15 0240 08/09/15 1346 08/10/15 0458 08/11/15 0033 08/11/15 0505 08/12/15 0435  NA 137 137 139 137  --  137  K 4.0 3.6 3.6 3.3*  --  3.5  CL 108 108 108 106  --  102  CO2 16* 22 23 23   --  28  GLUCOSE 123* 123* 115* 140*  --  97  BUN 10 11 10 9   --  13  CREATININE 0.66 0.58 0.64 0.74  --  0.90  CALCIUM 8.6* 9.1 9.0 8.8*  --  8.9  MG  --   --   --   --  1.7  --    GFR: Estimated Creatinine Clearance: 64 mL/min (by C-G formula based on Cr of 0.9). Liver Function Tests:  Recent Labs Lab 08/10/15 0458 08/11/15 0033  AST 15 21  ALT 14 16  ALKPHOS 70 70  BILITOT 0.5 0.4  PROT 6.8 6.7  ALBUMIN 3.8 3.7   No results for input(s): LIPASE, AMYLASE in the last 168 hours. No results for input(s): AMMONIA in the last 168 hours. Coagulation Profile: No results for input(s): INR, PROTIME in the last 168 hours. Cardiac Enzymes:  Recent Labs Lab 08/08/15 1720 08/08/15 2143 08/09/15 0240 08/09/15 0840  TROPONINI <0.03 <0.03 <0.03 <0.03   BNP (last 3 results) No results for input(s): PROBNP in the last 8760 hours. HbA1C: No results for input(s): HGBA1C in the last 72 hours. CBG: No results for input(s): GLUCAP in the last 168 hours. Lipid Profile: No results for input(s): CHOL, HDL, LDLCALC, TRIG, CHOLHDL, LDLDIRECT in the last 72 hours. Thyroid Function Tests: No results for input(s): TSH, T4TOTAL, FREET4, T3FREE, THYROIDAB in the last 72 hours. Anemia Panel: No results for input(s): VITAMINB12, FOLATE, FERRITIN, TIBC, IRON, RETICCTPCT in the last 72 hours. Sepsis Labs: No results for input(s): PROCALCITON, LATICACIDVEN in the last 168 hours.  Recent Results (from the past 240 hour(s))  MRSA PCR Screening     Status: None   Collection Time: 08/08/15  8:19 PM  Result Value Ref Range Status   MRSA by PCR NEGATIVE NEGATIVE Final    Comment:        The GeneXpert MRSA Assay (FDA approved for  NASAL specimens only), is one component of a comprehensive MRSA colonization surveillance program. It is not intended to diagnose MRSA infection nor to guide or monitor treatment for MRSA infections.          Radiology Studies: Dg Chest 1 View  08/11/2015  CLINICAL DATA:  Acute onset of shortness of breath and abdominal distention. Initial encounter. EXAM: CHEST 1 VIEW COMPARISON:  Chest radiograph performed 08/08/2015 FINDINGS: The lungs are well-aerated. Small bilateral pleural effusions are noted, left greater than right. Mild bibasilar airspace opacities may reflect interstitial edema, given vascular congestion. There is no evidence of pneumothorax. The cardiomediastinal silhouette is borderline normal in size. No acute osseous abnormalities are seen. Scattered clips are seen at the left axilla. IMPRESSION: Small bilateral pleural effusions, left greater than right. Mild bibasilar airspace opacities may reflect interstitial edema, given vascular congestion. Electronically Signed   By: Garald Balding M.D.   On: 08/11/2015 00:03  Dg Abd 1 View  08/11/2015  CLINICAL DATA:  Acute onset of worsening shortness of breath and abdominal distention. Initial encounter. EXAM: ABDOMEN - 1 VIEW COMPARISON:  None. FINDINGS: The visualized bowel gas pattern is unremarkable. There is a relative paucity of bowel gas within the abdomen. Scattered air and stool filled loops of colon are seen; no abnormal dilatation of small bowel loops is seen to suggest small bowel obstruction. No free intra-abdominal air is identified, though evaluation for free air is limited on a single supine view. The visualized osseous structures are within normal limits; the sacroiliac joints are unremarkable in appearance. Small bilateral pleural effusions are seen, left greater than right. IMPRESSION: Unremarkable bowel gas pattern; no free intra-abdominal air seen. Relative paucity of bowel gas within the abdomen. Electronically  Signed   By: Garald Balding M.D.   On: 08/11/2015 00:04        Scheduled Meds: . furosemide  20 mg Intravenous BID  . levothyroxine  112 mcg Oral QAC breakfast  . metoprolol succinate  25 mg Oral BID  . omega-3 acid ethyl esters  1 g Oral Daily  . pantoprazole  40 mg Oral Daily  . rivaroxaban  20 mg Oral Q supper  . sodium chloride flush  3 mL Intravenous Q12H   Continuous Infusions:     LOS: 4 days    Time spent: 30 minutes    Rexene Alberts, MD [facility] Pager [phone]  If 7PM-7AM, please contact night-coverage [email or web address] Password [facility] 08/12/2015, 10:34 AM

## 2015-08-12 NOTE — Care Management Important Message (Signed)
Important Message  Patient Details  Name: [name] Fry MRN: [id number] Date of Birth: 1945/05/04   Medicare Important Message Given:  Yes    Sherald Barge, RN 08/12/2015, 8:26 AM

## 2015-08-13 LAB — BASIC METABOLIC PANEL
ANION GAP: 10 (ref 5–15)
BUN: 16 mg/dL (ref 6–20)
CALCIUM: 9.6 mg/dL (ref 8.9–10.3)
CO2: 28 mmol/L (ref 22–32)
Chloride: 101 mmol/L (ref 101–111)
Creatinine, Ser: 0.91 mg/dL (ref 0.44–1.00)
GFR calc non Af Amer: >60 mL/min (ref >60)
GLUCOSE: 104 mg/dL — AB (ref 65–99)
POTASSIUM: 4.1 mmol/L (ref 3.5–5.1)
Sodium: 139 mmol/L (ref 135–145)

## 2015-08-13 LAB — MAGNESIUM: Magnesium: 1.8 mg/dL (ref 1.7–2.4)

## 2015-08-13 MED ORDER — FUROSEMIDE 40 MG PO TABS
40.0000 mg | ORAL_TABLET | Freq: Every day | ORAL | Status: DC
  Administered 2015-08-13 – 2015-08-14 (×2): 40 mg via ORAL
  Filled 2015-08-13 (×2): qty 1

## 2015-08-13 MED ORDER — AMIODARONE LOAD VIA INFUSION
150.0000 mg | Freq: Once | INTRAVENOUS | Status: AC
  Administered 2015-08-13: 150 mg via INTRAVENOUS
  Filled 2015-08-13: qty 83.34

## 2015-08-13 MED ORDER — AMIODARONE HCL 200 MG PO TABS
400.0000 mg | ORAL_TABLET | Freq: Two times a day (BID) | ORAL | Status: DC
  Administered 2015-08-13: 400 mg via ORAL
  Filled 2015-08-13: qty 2

## 2015-08-13 MED ORDER — DILTIAZEM HCL 100 MG IV SOLR
5.0000 mg/h | INTRAVENOUS | Status: DC
  Administered 2015-08-13: 5 mg/h via INTRAVENOUS
  Filled 2015-08-13: qty 100

## 2015-08-13 MED ORDER — AMIODARONE HCL IN DEXTROSE 360-4.14 MG/200ML-% IV SOLN
30.0000 mg/h | INTRAVENOUS | Status: DC
  Administered 2015-08-13 (×2): 30 mg/h via INTRAVENOUS
  Filled 2015-08-13: qty 200

## 2015-08-13 MED ORDER — AMIODARONE HCL IN DEXTROSE 360-4.14 MG/200ML-% IV SOLN
60.0000 mg/h | INTRAVENOUS | Status: AC
  Administered 2015-08-13 (×2): 60 mg/h via INTRAVENOUS
  Filled 2015-08-13 (×2): qty 200

## 2015-08-13 MED ORDER — POTASSIUM CHLORIDE CRYS ER 20 MEQ PO TBCR
20.0000 meq | EXTENDED_RELEASE_TABLET | Freq: Two times a day (BID) | ORAL | Status: DC
  Administered 2015-08-13 – 2015-08-14 (×2): 20 meq via ORAL
  Filled 2015-08-13 (×2): qty 1

## 2015-08-13 NOTE — Progress Notes (Signed)
Primary Cardiologist: Carlyle Dolly MD  Cardiology Specific Problem List: 1. Atrial fib 2. CAD 3. Systolic Dysfunction   Subjective:    Complains of fatigue and is frustrated that heart rate is not being well controlled. Put back on diltiazem gtt last evening due to elevated HR.   Objective:   Temp:  [97 F (36.1 C)-98.4 F (36.9 C)] 98.4 F (36.9 C) (05/30 0400) Pulse Rate:  [39-145] 126 (05/30 0630) Resp:  [13-25] 20 (05/30 0630) BP: (95-138)/(50-115) 115/89 mmHg (05/30 0630) SpO2:  [89 %-100 %] 94 % (05/30 0630) Weight:  [199 lb 11.8 oz (90.6 kg)] 199 lb 11.8 oz (90.6 kg) (05/30 0500) Last BM Date: 08/12/15  Filed Weights   08/10/15 0400 08/11/15 0500 08/13/15 0500  Weight: 212 lb 8.4 oz (96.4 kg) 210 lb 15.7 oz (95.7 kg) 199 lb 11.8 oz (90.6 kg)    Intake/Output Summary (Last 24 hours) at 08/13/15 0744 Last data filed at 08/13/15 0650  Gross per 24 hour  Intake 520.47 ml  Output   3975 ml  Net -3454.53 ml    Procedures:  ECHO 08/11/15:Study Conclusions - Left ventricle: The cavity size was normal. Wall thickness was  increased in a pattern of moderate LVH. Systolic function was  moderately reduced. The estimated ejection fraction was in the  range of 35% to 40%. Global hypokinesis with inferior akinesis,  incoordinate septal motion. The study is not technically  sufficient to allow evaluation of LV diastolic function. Ejection  fraction (MOD, 2-plane): 40%. - Aortic valve: Trileaflet. Sclerosis without stenosis. There was  trivial regurgitation. - Mitral valve: Calcified annulus. Mildly thickened leaflets .  There was moderate regurgitation. - Left atrium: The atrium was normal in size. - Right ventricle: The cavity size was normal. - Right atrium: The atrium was mildly dilated. - Tricuspid valve: There was moderate regurgitation. - Pulmonary arteries: PA peak pressure: 30 mm Hg (S) + RAP. - Systemic veins: Not visualized. - Pericardium,  extracardiac: A trivial pericardial effusion was  identified posterior to the heart. There was a left pleural  effusion. Impressions: - LVEF 35-40%, moderate LVH, global hypokinesis with inferior  akinesis and incoordinate septal motion, aortic valve sclerosis  with trivial AI, mitral annular calcification with moderate MR,  moderate TR, RVSP 30 mmHG + right atrial pressure, IVC not  visualized, trivial posterior pericardial effusion and left   pleural effusion.  Telemetry:  Atrial fibrillation rates 115-140 bpm  Exam:  General: No acute distress. Frail  HEENT: Conjunctiva and lids normal, oropharynx clear.  Lungs: Clear to auscultation, nonlabored.  Cardiac: No elevated JVP or bruits. IRRR, tachycardic,  no gallop or rub.   Abdomen: Normoactive bowel sounds, nontender, nondistended.  Extremities: No pitting edema, distal pulses full.  Neuropsychiatric: Alert and oriented x3, affect appropriate.   Lab Results:  Basic Metabolic Panel:  Recent Labs Lab 08/11/15 0033 08/11/15 0505 08/12/15 0435 08/13/15 0228  NA 137  --  137 139  K 3.3*  --  3.5 4.1  CL 106  --  102 101  CO2 23  --  28 28  GLUCOSE 140*  --  97 104*  BUN 9  --  13 16  CREATININE 0.74  --  0.90 0.91  CALCIUM 8.8*  --  8.9 9.6  MG  --  1.7  --  1.8    Liver Function Tests:  Recent Labs Lab 08/10/15 0458 08/11/15 0033  AST 15 21  ALT 14 16  ALKPHOS 70 70  BILITOT 0.5 0.4  PROT 6.8 6.7  ALBUMIN 3.8 3.7    CBC:  Recent Labs Lab 08/11/15 0033 08/11/15 0505 08/12/15 0435  WBC 9.4 8.9 6.8  HGB 10.9* 11.4* 11.1*  HCT 33.7* 34.7* 32.9*  MCV 87.5 87.0 86.4  PLT 262 262 222    Cardiac Enzymes:  Recent Labs Lab 08/08/15 2143 08/09/15 0240 08/09/15 0840  TROPONINI <0.03 <0.03 <0.03      Medications:   Scheduled Medications: . furosemide  20 mg Intravenous BID  . levothyroxine  112 mcg Oral QAC breakfast  . metoprolol succinate  25 mg Oral BID  . omega-3 acid ethyl  esters  1 g Oral Daily  . pantoprazole  40 mg Oral Daily  . potassium chloride  30 mEq Oral BID  . rivaroxaban  20 mg Oral Q supper  . sodium chloride flush  3 mL Intravenous Q12H    Infusions: . diltiazem (CARDIZEM) infusion 11 mg/hr (08/13/15 0650)    PRN Medications: alum & mag hydroxide-simeth, guaiFENesin, levalbuterol, traZODone   Assessment and Plan:   1. Atrial fib with RVR:  Heart rate has been labile with rapid rate overnight. Diltiazem was started. Will discontinue this is the setting of reduced systolic function. She has been on amiodarone initially but this caused a decrease in her BP at first after loading. Will begin po amiodarone 400 mg BID now, as her BP normalized after fluid bolus. Creatinine is 0.91.She is also currently on metoprolol 25 mg BID.       Can consider adding digoxin. If BP does not tolerate amiodarone with metoprolol. There was discussion last week about DCCV. Will defer to Dr. Harl Bowie concerning need to proceed with this. Continue Xarelto. CHADS VASC Score of 4.   2. Systolic Dysfunction: Most recent EF demonstrated low EF of 35-40%. She is on metoprolol for HR control but can consider carvedilol. BP is soft as expected. Taking her off of diltiazem in this setting.   3. CAD: Most recent cardiac cath on 07/26/2015 demonstrated Proximal LAD 30% stenosis, Mid Cx 0% stenosed of previously implanted stent, with no significant obstruction of RCA. No longer on ASA in the setting of Xarelto. On Lovaza .    Phill Myron. Lawrence NP [place]  08/13/2015, 7:44 AM   Attending Note Patient seen and discussed with NP Purcell Nails, I agree with her documenation above. 29 female with history of chronic systolic HF LVEF [id number], HTN, afib on xarelto admitted with afib with RVR.   Recent issues with afib with RVR over the last few. Rates not controlled with beta blocker as outpatient. As inpatient doses of dilt drip limited by hypotension. Started on amiodarone drip 08/09/15, drip  stopped 08/11/15. Had been on dilt drip but stopped this AM due to low LVEF. Remains on Toprol XL 25mg  bid. Tele shows she was in SR for several hours yesterday afternoon and evening however converted back to Afib. We will restart amiodarone drip with repeat loading with plans to transition to oral, continue oral Toprol. Stop dilt in setting of low LVEF. Would not pursue DCCV given the fact she converted on her own for a period of time yesterday. Of note she was off xarelto 07/26/15 for cath and restarted 07/27/15, if DCCV was to be reconsidered would need TEE prior.   Zandra Abts MD

## 2015-08-13 NOTE — Progress Notes (Signed)
eLink Physician-Brief Progress Note Patient Name: [name] Fry DOB: 1945/05/19 MRN: [id number]   Date of Service  08/13/2015  HPI/Events of Note  12 Lead EKG c/w AFIB with rate = 134. BP = 131/89.  eICU Interventions  Will order: 1. Diltiazem IV infusion. 2. Send AM BMP and Mg++ level now.       Intervention Category Major Interventions: Arrhythmia - evaluation and management  Jahmeir Geisen Eugene 08/13/2015, 2:00 AM

## 2015-08-13 NOTE — Consult Note (Signed)
   [facility] CM Inpatient Consult   08/13/2015  [name] Fry 1945/07/04 [id number]   Spoke with patient at bedside regarding [facility] services. Patient states she is lives alone and is independent, at this point does not want to sign up with [facility] program but may consider it at a later time. Patient given [facility] brochure and contact information for future reference.  Of note, [facility] Care Management services would not replace or interfere with any services that are arranged by inpatient case management or social work. For additional questions or referrals please contact:  Royetta Crochet. Laymond Purser, RN, BSN, [facility] Liaison [phone]

## 2015-08-13 NOTE — Progress Notes (Signed)
eLink Physician-Brief Progress Note Patient Name: [name] Fry DOB: [birth year] MRN: [id number]   Date of Service  08/13/2015  HPI/Events of Note  HR = 136. AFIB?  eICU Interventions  Will order 12 Lead EKG to assess rhythm.      Intervention Category Major Interventions: Arrhythmia - evaluation and management  Sommer,Steven Eugene 08/13/2015, 1:38 AM

## 2015-08-13 NOTE — Progress Notes (Signed)
PROGRESS NOTE    [name] Fry  [id number] DOB: 09-24-1945 DOA: 08/08/2015 PCP: [name], MD    Brief Narrative:  Patient is a 70 year old woman with a history of chronic atrial fibrillation on Xarelto, nonobstructive CAD with previous stenting, and hypothyroidism, who presented from her PCPs office when she was found to be in atrial fibrillation with rapid ventricular response. The patient had mild shortness of breath, otherwise she reported no chest pain during the RVR. She recently underwent a heart catheterization on 07/26/15 which revealed a patent stent and nonobstructive CAD. In the ED, she was tachycardic with a heart rate ranging from 120s to the 150s. Her EKG revealed atrial fibrillation with rapid ventricular response. Her chest x-ray revealed right lower lobe opacity which could reflect infiltrate or atelectasis; mild to moderate cardiac enlargement with no evidence of pulmonary edema. Her initial blood work was unremarkable including a negative troponin I. She was admitted for further evaluation and management. Cardiology was consulted to assist with management of A. fib with RVR. Heart rate still uncontrolled. -Patient developed pulmonary edema, from systolic heart failure. Her EF was noted with a decrease to 35-40%. IV Lasix started, but will be transitioned to by mouth.   Assessment & Plan:   Principal Problem:   Atrial fibrillation with RVR (HCC) Active Problems:   Coronary atherosclerosis of native coronary artery   CAP (community acquired pneumonia)   Hypotension   Acute systolic CHF (congestive heart failure) (HCC)   Hyperlipidemia   Hypothyroidism   Metabolic acidosis   1. Chronic atrial fibrillation with RVR. Patient was restarted on Xarelto. Diltiazem drip was started. Toprol-XL was restarted. Amlodipine was held. The patient had become hypotensive with a blood pressure in the 0000000 systolically following admission. She was bolused IV fluids and diltiazem drip  and Toprol-XL were withheld. Her blood pressure improved and diltiazem and Toprol were restarted. -Her TSH was within normal limits. -Cardiology was consulted. They started amiodarone loading. -Patient's heart rate has trended down and was intermittently bradycardic. Follow-up EKG 5/29 reveal sinus rhythm with blocked PACs and left axis deviation-heart rate 61 bpm. -Amiodarone drip and Cardizem drip DC'd over the weekend; metoprolol continued and diltiazem changed to by mouth. -Patient went back into rapid atrial fibrillation; diltiazem drip restarted, but was discontinued by cardiology. Oral amiodarone was started. Continued metoprolol. -Cardiology is considering TEE guided cardioversion.  Acute systolic congestive heart failure. Patient developed some respiratory distress on early 08/11/15. Chest x-ray revealed small bilateral pleural effusions right greater than left, possible interstitial edema. -Patient was started on IV Lasix. She was diuresed over 4.5 L. She is less short of breath and pulmonary exam has improved. -Echo on 06/11/15 revealed an EF of 35-40% with global hypokinesis and inferior akinesis of the left ventricle. Apparently there has been a decrease in her EF along with wall motion abnormality compared to cardiac catheterization on 07/26/15. -She is already on anticoagulation and a beta blocker. We'll consider starting an ACE inhibitor, but the patient's blood pressures have been on the low-normal side. -We'll change Lasix to by mouth or per recommendations by cardiology.  Coronary artery disease with previous stenting. Patient underwent cardiac catheterization on 07/26/15 and revealed nonobstructive CAD and a patent stent in the left circumflex. Her EF was within normal limits at 55-60%. Patient does not report chest pain. Her troponin I has been negative 3. Continue medical management.  Hypertension. The patient is treated chronically with Toprol-XL and amlodipine. As stated  above, she became hypotensive on  the diltiazem drip following admission. We'll continue to hold Norvasc. Toprol-XL was restarted as tolerated. Her blood pressure has been stable.  Community-acquired pneumonia. Patient's chest x-ray on admission revealed an infiltrate versus atelectasis. She has had a mild cough at home, but no subjective fever. Azithromycin and Rocephin were started. Antibiotic changed to monotherapy with Levaquin. She remains afebrile. -Antibiotic course was completed.  Hypothyroidism. The patient was restarted on Synthroid. Her TSH is within normal limits.  Metabolic acidosis. Patient's CO2 fell to 16. Etiology was unknown. Follow-up basic metabolic panel revealed resolution in her CO2. Query lab error.   DVT prophylaxis: Xarelto Code Status: Full code Family Communication: Discussed with daughter Disposition Plan: Discharge with clinically appropriate.   Consultants:   Cardiology  Procedures:  ECHO 08/11/15:Study Conclusions - Left ventricle: The cavity size was normal. Wall thickness was  increased in a pattern of moderate LVH. Systolic function was  moderately reduced. The estimated ejection fraction was in the  range of 35% to 40%. Global hypokinesis with inferior akinesis,  incoordinate septal motion. The study is not technically  sufficient to allow evaluation of LV diastolic function. Ejection  fraction (MOD, 2-plane): 40%. - Aortic valve: Trileaflet. Sclerosis without stenosis. There was  trivial regurgitation. - Mitral valve: Calcified annulus. Mildly thickened leaflets .  There was moderate regurgitation. - Left atrium: The atrium was normal in size. - Right ventricle: The cavity size was normal. - Right atrium: The atrium was mildly dilated. - Tricuspid valve: There was moderate regurgitation. - Pulmonary arteries: PA peak pressure: 30 mm Hg (S) + RAP. - Systemic veins: Not visualized. - Pericardium, extracardiac: A trivial pericardial  effusion was  identified posterior to the heart. There was a left pleural  effusion. Impressions: - LVEF 35-40%, moderate LVH, global hypokinesis with inferior  akinesis and incoordinate septal motion, aortic valve sclerosis  with trivial AI, mitral annular calcification with moderate MR,  moderate TR, RVSP 30 mmHG + right atrial pressure, IVC not  visualized, trivial posterior pericardial effusion and left   pleural effusion.  Antimicrobials: (specify start and planned stop date. Auto populated tables are space occupying and do not give end dates)  Levaquin 5/27>> 08/12/15  Azithromycin 08/08/15> 08/10/15  Rocephin 08/08/15> 08/10/15   Subjective: Patient is sitting up in the chair. She complains of generalized weakness. She feels less short of breath, but did feel her heart race overnight. Nursing reports patient's heart rate went back up into the 140s overnight.  Objective: Filed Vitals:   08/13/15 0530 08/13/15 0600 08/13/15 0630 08/13/15 0824  BP: 110/76 97/77 115/89   Pulse: 142 77 126   Temp:    98.3 F (36.8 C)  TempSrc:    Oral  Resp: 21 19 20    Height:      Weight:      SpO2: 91% 94% 94%    temperature 97.4.  Intake/Output Summary (Last 24 hours) at 08/13/15 0919 Last data filed at 08/13/15 0650  Gross per 24 hour  Intake 520.47 ml  Output   3975 ml  Net -3454.53 ml   Filed Weights   08/10/15 0400 08/11/15 0500 08/13/15 0500  Weight: 96.4 kg (212 lb 8.4 oz) 95.7 kg (210 lb 15.7 oz) 90.6 kg (199 lb 11.8 oz)    Examination:  General exam: Appears calm and comfortable  Respiratory system: Breathing nonlabored at rest.Rare basilar crackles; better air movement. Cardiovascular system: Irregular, irregular, with tachycardia. No pedal edema. Gastrointestinal system: Abdomen is mildly obese nondistended, soft and nontender. No  organomegaly or masses felt. Normal bowel sounds heard. Central nervous system: Alert and oriented. No focal neurological  deficits. Extremities: Symmetric 5 x 5 power. Skin: No rashes, lesions or ulcers Psychiatry: Judgement and insight appear normal. Mood & affect appropriate.     Data Reviewed: I have personally reviewed following labs and imaging studies  CBC:  Recent Labs Lab 08/08/15 1720 08/09/15 0840 08/11/15 0033 08/11/15 0505 08/12/15 0435  WBC 7.8 6.8 9.4 8.9 6.8  NEUTROABS 4.5  --   --   --   --   HGB 12.8 11.7* 10.9* 11.4* 11.1*  HCT 38.6 35.5* 33.7* 34.7* 32.9*  MCV 85.8 86.6 87.5 87.0 86.4  PLT 311 283 262 262 [id number]   Basic Metabolic Panel:  Recent Labs Lab 08/09/15 1346 08/10/15 0458 08/11/15 0033 08/11/15 0505 08/12/15 0435 08/13/15 0228  NA 137 139 137  --  137 139  K 3.6 3.6 3.3*  --  3.5 4.1  CL 108 108 106  --  102 101  CO2 22 23 23   --  28 28  GLUCOSE 123* 115* 140*  --  97 104*  BUN 11 10 9   --  13 16  CREATININE 0.58 0.64 0.74  --  0.90 0.91  CALCIUM 9.1 9.0 8.8*  --  8.9 9.6  MG  --   --   --  1.7  --  1.8   GFR: Estimated Creatinine Clearance: 61.5 mL/min (by C-G formula based on Cr of 0.91). Liver Function Tests:  Recent Labs Lab 08/10/15 0458 08/11/15 0033  AST 15 21  ALT 14 16  ALKPHOS 70 70  BILITOT 0.5 0.4  PROT 6.8 6.7  ALBUMIN 3.8 3.7   No results for input(s): LIPASE, AMYLASE in the last 168 hours. No results for input(s): AMMONIA in the last 168 hours. Coagulation Profile: No results for input(s): INR, PROTIME in the last 168 hours. Cardiac Enzymes:  Recent Labs Lab 08/08/15 1720 08/08/15 2143 08/09/15 0240 08/09/15 0840  TROPONINI <0.03 <0.03 <0.03 <0.03   BNP (last 3 results) No results for input(s): PROBNP in the last 8760 hours. HbA1C: No results for input(s): HGBA1C in the last 72 hours. CBG: No results for input(s): GLUCAP in the last 168 hours. Lipid Profile: No results for input(s): CHOL, HDL, LDLCALC, TRIG, CHOLHDL, LDLDIRECT in the last 72 hours. Thyroid Function Tests: No results for input(s): TSH, T4TOTAL,  FREET4, T3FREE, THYROIDAB in the last 72 hours. Anemia Panel: No results for input(s): VITAMINB12, FOLATE, FERRITIN, TIBC, IRON, RETICCTPCT in the last 72 hours. Sepsis Labs: No results for input(s): PROCALCITON, LATICACIDVEN in the last 168 hours.  Recent Results (from the past 240 hour(s))  MRSA PCR Screening     Status: None   Collection Time: 08/08/15  8:19 PM  Result Value Ref Range Status   MRSA by PCR NEGATIVE NEGATIVE Final    Comment:        The GeneXpert MRSA Assay (FDA approved for NASAL specimens only), is one component of a comprehensive MRSA colonization surveillance program. It is not intended to diagnose MRSA infection nor to guide or monitor treatment for MRSA infections.          Radiology Studies: No results found.      Scheduled Meds: . amiodarone  400 mg Oral BID  . furosemide  20 mg Intravenous BID  . levothyroxine  112 mcg Oral QAC breakfast  . metoprolol succinate  25 mg Oral BID  . omega-3 acid ethyl esters  1 g  Oral Daily  . pantoprazole  40 mg Oral Daily  . potassium chloride  30 mEq Oral BID  . rivaroxaban  20 mg Oral Q supper  . sodium chloride flush  3 mL Intravenous Q12H   Continuous Infusions:     LOS: 5 days    Time spent: 30 minutes    Rexene Alberts, MD [facility] Pager [phone]  If 7PM-7AM, please contact night-coverage [email or web address] Password [email or web address] 08/13/2015, 9:19 AM

## 2015-08-14 ENCOUNTER — Ambulatory Visit (INDEPENDENT_AMBULATORY_CARE_PROVIDER_SITE_OTHER): Payer: Self-pay | Admitting: Ophthalmology

## 2015-08-14 LAB — BASIC METABOLIC PANEL
ANION GAP: 11 (ref 5–15)
BUN: 14 mg/dL (ref 6–20)
CO2: 28 mmol/L (ref 22–32)
Calcium: 9.1 mg/dL (ref 8.9–10.3)
Chloride: 98 mmol/L — ABNORMAL LOW (ref 101–111)
Creatinine, Ser: 0.84 mg/dL (ref 0.44–1.00)
GFR calc Af Amer: >60 mL/min (ref >60)
GFR calc non Af Amer: >60 mL/min (ref >60)
GLUCOSE: 105 mg/dL — AB (ref 65–99)
POTASSIUM: 3.7 mmol/L (ref 3.5–5.1)
Sodium: 137 mmol/L (ref 135–145)

## 2015-08-14 MED ORDER — METOPROLOL SUCCINATE ER 25 MG PO TB24: 37.5000 mg | ORAL_TABLET | Freq: Two times a day (BID) | ORAL | Status: DC

## 2015-08-14 MED ORDER — FUROSEMIDE 40 MG PO TABS
40.0000 mg | ORAL_TABLET | Freq: Every day | ORAL | Status: DC | PRN
Start: 2015-08-14 — End: 2018-05-20

## 2015-08-14 MED ORDER — AMIODARONE HCL 200 MG PO TABS
400.0000 mg | ORAL_TABLET | Freq: Two times a day (BID) | ORAL | Status: DC
  Administered 2015-08-14: 400 mg via ORAL
  Filled 2015-08-14: qty 2

## 2015-08-14 MED ORDER — METOPROLOL SUCCINATE ER 25 MG PO TB24
12.5000 mg | ORAL_TABLET | Freq: Once | ORAL | Status: AC
  Administered 2015-08-14: 12.5 mg via ORAL
  Filled 2015-08-14: qty 1

## 2015-08-14 MED ORDER — AMIODARONE HCL 400 MG PO TABS: 400.0000 mg | ORAL_TABLET | Freq: Two times a day (BID) | ORAL | Status: DC

## 2015-08-14 NOTE — Care Management Important Message (Signed)
Important Message  Patient Details  Name: [name] Fry MRN: [id number] Date of Birth: 12/25/1945   Medicare Important Message Given:  Yes    Sherald Barge, RN 08/14/2015, 11:11 AM

## 2015-08-14 NOTE — Progress Notes (Signed)
D/c instructions reviewed with patient and daughter. Verbalized understanding. Pt dc'd to home with daughter.

## 2015-08-14 NOTE — Care Management Note (Signed)
Case Management Note  Patient Details  Name: [name] HARTLAGE MRN: [id number] Date of Birth: 07-30-1945   Expected Discharge Date:    08/14/2015              Expected Discharge Plan:  Home/Self Care  In-House Referral:  NA  Discharge planning Services  CM Consult  Post Acute Care Choice:  NA Choice offered to:  NA  DME Arranged:    DME Agency:     HH Arranged:    [place] Agency:     Status of Service:  Completed, signed off  Medicare Important Message Given:  Yes Date Medicare IM Given:    Medicare IM give by:    Date Additional Medicare IM Given:    Additional Medicare Important Message give by:     If discussed at [place] of Stay Meetings, dates discussed:    Additional Comments: Pt discharging home today with self care.  Sherald Barge, RN 08/14/2015, 3:14 PM

## 2015-08-14 NOTE — Progress Notes (Signed)
**Note De-Identified [name] Fry Obfuscation** Abnormal EKG reported to RN and placed in patients chart

## 2015-08-14 NOTE — Discharge Summary (Signed)
Physician Discharge Summary  [name] Fry [id number] DOB: [birth year] DOA: 08/08/2015  PCP: [name], MD  Admit date: 08/08/2015 Discharge date: 08/14/2015  Time spent: 45 minutes  Recommendations for Outpatient Follow-up:  -We'll be discharged home today. -Has follow-up in less than one week with cardiology. -Discharge on amiodarone 400 mg by mouth twice a day per cardiology recommendations to taper off, this can be further accomplished at time of follow-up appointment with cardiology.   Discharge Diagnoses:  Principal Problem:   Atrial fibrillation with RVR (HCC) Active Problems:   Hyperlipidemia   Hypothyroidism   Coronary atherosclerosis of native coronary artery   CAP (community acquired pneumonia)   Hypotension   Metabolic acidosis   Acute systolic CHF (congestive heart failure) ([place])   Discharge Condition: Stable and improved  Filed Weights   08/11/15 0500 08/13/15 0500 08/14/15 0500  Weight: 95.7 kg (210 lb 15.7 oz) 90.6 kg (199 lb 11.8 oz) 90.6 kg (199 lb 11.8 oz)    History of present illness:  As per Dr. Marin Comment on 5/25: [name] Fry is a 70 y.o. female with medical history significant of HLD, CAD, hypothyroidism, and atrial fibrillation, was sent from her PCP's office after she was found to have Afib with RVR with associated SOB that is worsened with exertion and improved by rest. She reports chronically taking Xarelto, but stopped temporarily for a heart catheter done on 5/12. She has been taking her Xarelto since her catheter, but missed her dose today. Her cath was negative. Stent was patent.  Per son, complaints of likely onset a week ago. She has a stent in place. Reports cough.  Does not drink alcohol. Denies any chest pain, abd pain, or headache.  While in the ED, bloodwork showed that her BMP and CBC were unremarkable. Her troponin was negative. She received 2X bolus Cardizem. Hospitalist was asked to refer for admission.  Hospital Course:    Chronic atrial fibrillation with RVR. -Had brief conversion to sinus rhythm at which point diltiazem and amiodarone drips were discontinued. -Has converted back to A. fib with RVR, was restarted on IV amiodarone load to be completed today and transitioned to oral.  -Toprol-XL dose has been increased from 25-37.5 twice daily. -No plans for cardioversion at this time. -Continue Xarelto for anticoagulation.  Acute hypoxemic respiratory failure/acute systolic CHF -2-D echo: Left ventricle: The cavity size was normal. Wall thickness was  increased in a pattern of moderate LVH. Systolic function was  moderately reduced. The estimated ejection fraction was in the  range of 35% to 40%. Global hypokinesis with inferior akinesis,  incoordinate septal motion. The study is not technically  sufficient to allow evaluation of LV diastolic function. -Has had decrease in ejection fraction since cath earlier this month, presumably tachycardia mediated. -Is 4.3 L negative since admission. -Continue current dose of Lasix 40 mg daily. -Is on beta blocker, ACE inhibitor/ARB has not been started due to hypotension.  Coronary artery disease with previous stenting. -Patient underwent cardiac catheterization on 07/26/15 and revealed nonobstructive CAD and a patent stent in the left circumflex. Patient does not report chest pain. Her troponin I has been negative 3. Continue medical management.  Hypertension. -Well-controlled, in fact with an episode of hypotension earlier this admission.  ?Community-acquired pneumonia. -Patient's chest x-ray on admission revealed an infiltrate versus atelectasis.  -In absence of cough fever and leukocytosis, I believe pneumonia is less likely, however will continue treatment for 5 days. -Has completed a course of antibiotics.  Hypothyroidism. -TSH within normal limits. -Continue Synthroid.  Procedures:  None   Consultations:  Cardiology  Discharge  Instructions  Discharge Instructions    Diet - low sodium heart healthy    Complete by:  As directed      Increase activity slowly    Complete by:  As directed             Medication List    STOP taking these medications        amLODipine 2.5 MG tablet  Commonly known as:  NORVASC      TAKE these medications        amiodarone 400 MG tablet  Commonly known as:  PACERONE  Take 1 tablet (400 mg total) by mouth 2 (two) times daily.     Fish Oil 1000 MG Caps  Take 1 capsule (1,000 mg total) by mouth 2 (two) times daily.     furosemide 40 MG tablet  Commonly known as:  LASIX  Take 1 tablet (40 mg total) by mouth daily as needed for fluid (or weight gain).     levothyroxine 112 MCG tablet  Commonly known as:  SYNTHROID, LEVOTHROID  Take 112 mcg by mouth daily.     metoprolol succinate 50 MG 24 hr tablet  Commonly known as:  TOPROL-XL  Take 25 mg by mouth 2 (two) times daily.     nitroGLYCERIN 0.4 MG SL tablet  Commonly known as:  NITROSTAT  Place 1 tablet (0.4 mg total) under the tongue every 5 (five) minutes x 3 doses as needed for chest pain.     OCUVITE PRESERVISION PO  Take 2 tablets by mouth daily.     rivaroxaban 20 MG Tabs tablet  Commonly known as:  XARELTO  Take 1 tablet (20 mg total) by mouth daily with supper.     Turmeric 500 MG Caps  Take 500 mg by mouth daily. Reported on 04/11/2015       Allergies  Allergen Reactions  . Doxycycline Rash       Follow-up Information    Follow up with Rozann Lesches, MD.   Specialty:  Cardiology   Why:  as scheduled for next week   Contact information:   [address] [place] [id number] [phone]        The results of significant diagnostics from this hospitalization (including imaging, microbiology, ancillary and laboratory) are listed below for reference.    Significant Diagnostic Studies: Dg Chest 1 View  08/11/2015  CLINICAL DATA:  Acute onset of shortness of breath and abdominal  distention. Initial encounter. EXAM: CHEST 1 VIEW COMPARISON:  Chest radiograph performed 08/08/2015 FINDINGS: The lungs are well-aerated. Small bilateral pleural effusions are noted, left greater than right. Mild bibasilar airspace opacities may reflect interstitial edema, given vascular congestion. There is no evidence of pneumothorax. The cardiomediastinal silhouette is borderline normal in size. No acute osseous abnormalities are seen. Scattered clips are seen at the left axilla. IMPRESSION: Small bilateral pleural effusions, left greater than right. Mild bibasilar airspace opacities may reflect interstitial edema, given vascular congestion. Electronically Signed   By: Garald Balding M.D.   On: 08/11/2015 00:03   Dg Abd 1 View  08/11/2015  CLINICAL DATA:  Acute onset of worsening shortness of breath and abdominal distention. Initial encounter. EXAM: ABDOMEN - 1 VIEW COMPARISON:  None. FINDINGS: The visualized bowel gas pattern is unremarkable. There is a relative paucity of bowel gas within the abdomen. Scattered air and stool filled loops of colon  are seen; no abnormal dilatation of small bowel loops is seen to suggest small bowel obstruction. No free intra-abdominal air is identified, though evaluation for free air is limited on a single supine view. The visualized osseous structures are within normal limits; the sacroiliac joints are unremarkable in appearance. Small bilateral pleural effusions are seen, left greater than right. IMPRESSION: Unremarkable bowel gas pattern; no free intra-abdominal air seen. Relative paucity of bowel gas within the abdomen. Electronically Signed   By: Garald Balding M.D.   On: 08/11/2015 00:04   Dg Chest Portable 1 View  08/08/2015  CLINICAL DATA:  Tachycardia EXAM: PORTABLE CHEST 1 VIEW COMPARISON:  09/29/2012 FINDINGS: Radiographic technique is inadequate. The image is over exposed and detail of the left upper lobe is lost. There is mild to moderate cardiac enlargement.  The vascular pattern is normal. The left lower lobe appears clear while there is mild opacity in the medial right lower lobe. IMPRESSION: Mild to moderate cardiac enlargement with no evidence of pulmonary edema. Right lower lobe opacity could represent infiltrate or atelectasis. Cannot evaluate left upper lobe due to inadequate radiographic technique. Electronically Signed   By: Skipper Cliche M.D.   On: 08/08/2015 18:43    Microbiology: Recent Results (from the past 240 hour(s))  MRSA PCR Screening     Status: None   Collection Time: 08/08/15  8:19 PM  Result Value Ref Range Status   MRSA by PCR NEGATIVE NEGATIVE Final    Comment:        The GeneXpert MRSA Assay (FDA approved for NASAL specimens only), is one component of a comprehensive MRSA colonization surveillance program. It is not intended to diagnose MRSA infection nor to guide or monitor treatment for MRSA infections.      Labs: Basic Metabolic Panel:  Recent Labs Lab 08/10/15 0458 08/11/15 0033 08/11/15 0505 08/12/15 0435 08/13/15 0228 08/14/15 0548  NA 139 137  --  137 139 137  K 3.6 3.3*  --  3.5 4.1 3.7  CL 108 106  --  102 101 98*  CO2 23 23  --  28 28 28   GLUCOSE 115* 140*  --  97 104* 105*  BUN 10 9  --  13 16 14   CREATININE 0.64 0.74  --  0.90 0.91 0.84  CALCIUM 9.0 8.8*  --  8.9 9.6 9.1  MG  --   --  1.7  --  1.8  --    Liver Function Tests:  Recent Labs Lab 08/10/15 0458 08/11/15 0033  AST 15 21  ALT 14 16  ALKPHOS 70 70  BILITOT 0.5 0.4  PROT 6.8 6.7  ALBUMIN 3.8 3.7   No results for input(s): LIPASE, AMYLASE in the last 168 hours. No results for input(s): AMMONIA in the last 168 hours. CBC:  Recent Labs Lab 08/08/15 1720 08/09/15 0840 08/11/15 0033 08/11/15 0505 08/12/15 0435  WBC 7.8 6.8 9.4 8.9 6.8  NEUTROABS 4.5  --   --   --   --   HGB 12.8 11.7* 10.9* 11.4* 11.1*  HCT 38.6 35.5* 33.7* 34.7* 32.9*  MCV 85.8 86.6 87.5 87.0 86.4  PLT 311 283 262 262 222   Cardiac  Enzymes:  Recent Labs Lab 08/08/15 1720 08/08/15 2143 08/09/15 0240 08/09/15 0840  TROPONINI <0.03 <0.03 <0.03 <0.03   BNP: BNP (last 3 results) No results for input(s): BNP in the last 8760 hours.  ProBNP (last 3 results) No results for input(s): PROBNP in the last 8760 hours.  CBG: No results for input(s): GLUCAP in the last 168 hours.     SignedLelon Frohlich  [facility] Pager: [phone] 08/14/2015, 3:35 PM

## 2015-08-14 NOTE — Progress Notes (Addendum)
Subjective:  Feeling better.   Objective:  Vital Signs in the last 24 hours: Temp:  [97.7 F (36.5 C)-98.7 F (37.1 C)] 98.7 F (37.1 C) (05/31 0400) Pulse Rate:  [40-136] 83 (05/31 0400) Resp:  [16-27] 19 (05/31 0400) BP: (79-149)/(63-104) 123/86 mmHg (05/31 0400) SpO2:  [90 %-97 %] 93 % (05/31 0400) FiO2 (%):  [28 %] 28 % (05/30 2100) Weight:  [199 lb 11.8 oz (90.6 kg)] 199 lb 11.8 oz (90.6 kg) (05/31 0500)  Intake/Output from previous day: 05/30 0701 - 05/31 0700 In: 1818.3 [P.O.:960; I.V.:858.3] Out: 1630 [Urine:1630] Intake/Output from this shift:    Physical Exam: NECK: Without JVD, HJR, or bruit LUNGS: Decreased breath sounds with a few crackles. Otherwise Clear anterior, posterior, lateral HEART: Irregular rate and rhythm, no murmur, gallop, rub, bruit, thrill, or heave EXTREMITIES: Without cyanosis, clubbing, or edema  Telemetry: Afib at 110/m  Lab Results:  Recent Labs  08/12/15 0435  WBC 6.8  HGB 11.1*  PLT 222    Recent Labs  08/13/15 0228 08/14/15 0548  NA 139 137  K 4.1 3.7  CL 101 98*  CO2 28 28  GLUCOSE 104* 105*  BUN 16 14  CREATININE 0.91 0.84   No results for input(s): TROPONINI in the last 72 hours.  Invalid input(s): CK, MB Hepatic Function Panel No results for input(s): PROT, ALBUMIN, AST, ALT, ALKPHOS, BILITOT, BILIDIR, IBILI in the last 72 hours. No results for input(s): CHOL in the last 72 hours. No results for input(s): PROTIME in the last 72 hours.    Cardiac Studies: ECHO 08/11/15:Study Conclusions - Left ventricle: The cavity size was normal. Wall thickness was   increased in a pattern of moderate LVH. Systolic function was   moderately reduced. The estimated ejection fraction was in the   range of 35% to 40%. Global hypokinesis with inferior akinesis,   incoordinate septal motion. The study is not technically   sufficient to allow evaluation of LV diastolic function. Ejection   fraction (MOD, 2-plane): 40%. - Aortic  valve: Trileaflet. Sclerosis without stenosis. There was   trivial regurgitation. - Mitral valve: Calcified annulus. Mildly thickened leaflets .   There was moderate regurgitation. - Left atrium: The atrium was normal in size. - Right ventricle: The cavity size was normal. - Right atrium: The atrium was mildly dilated. - Tricuspid valve: There was moderate regurgitation. - Pulmonary arteries: PA peak pressure: 30 mm Hg (S) + RAP. - Systemic veins: Not visualized. - Pericardium, extracardiac: A trivial pericardial effusion was   identified posterior to the heart. There was a left pleural   effusion. Impressions: - LVEF 35-40%, moderate LVH, global hypokinesis with inferior   akinesis and incoordinate septal motion, aortic valve sclerosis   with trivial AI, mitral annular calcification with moderate MR,   moderate TR, RVSP 30 mmHG + right atrial pressure, IVC not   visualized, trivial posterior pericardial effusion and left    pleural effusion.   Assessment/Plan:  1. Atrial fib with RVR:  amiodarone drip started yesterday with plans for repeat loading and transitioned to oral. On 30 mg/hr.Continue Toprol, ? Increase dose. BP better.Diltiazem stopped in setting of low EF and hypotension. She was off Xarelto for cath 07/26/15 and restarted on 07/27/15 so would need TEE if DCCV is to be considered but no plans at this time that she's had some normal sinus rhythm. Continue Xarelto. CHADS VASC Score of 4.   2. Systolic Dysfunction:  EF demonstrated low EF of 35-40%. Negative 3454  yest on Lasix 40 mg po daily  3. CAD: Most recent cardiac cath on 07/26/2015 demonstrated Proximal LAD 30% stenosis, Mid Cx 0% stenosed of previously implanted stent, with no significant obstruction of RCA. No longer on ASA in the setting of Xarelto. On Lovaza .     LOS: 6 days    [name] Fry 08/14/2015, 7:52 AM  Attending Note Restarted amio drip yesterday, in setting of low LVEF dilt gtt was discontinued. She  has paroxysmal afib with episodes of SR for several hours a few days ago, thus we are not pursuing DCCV at this time. She will complete 24 hr load of amio today, transition to 400mg  bid x 7 days then 200mg  bid for 14 days, then 200mg  daily maintence. We will increase Toprol XL to 37.5mg  bid. She remains in afib this morning rates 90s-100s. Continue xarelto for stroke prevention. Newly diagnosed low LVEF, potentially tachycardia medicated. She recently had a cath without significant obstructive disease. She will need titration of CHF meds over time and repeat echo in 3-6 months. For her systolic dysfunction she is on Toprol XL 25mg  bid. She has had intermittent hypotension on IV drips for her afib with RVR and thus we have not started ACE-I/ARB/aldactone but will need to be considered as outpatient, possibly entresto. Echo with moderate MR and TR, this will also need to be followed.  We will follow up rates later this afternoon. Potential discharge if she has better rate control.   Zandra Abts MD   Addendum 130pm  Heart rates 90-110s, patient with no complaints. We will plan for discharge today, rates are reasonable and meds can be further titrated as needed as outpatient. Would start lasix 40mg  prn at discharge, counseled to take if LE edema, SOB, or weight gain. Her volume status will also need to be reassessed at follow up. She has close f/u with Dr Domenic Polite this coming Monday. Her amio will also need to be adjusted at that f/u appointment, plan would be to convert to 200mg  bid a week from today.  Zandra Abts MD

## 2015-08-14 NOTE — Progress Notes (Signed)
PROGRESS NOTE    [name] Fry  [id number] DOB: [birth year] DOA: 08/08/2015 PCP: [name], MD    Brief Narrative:  Patient is a 70 year old woman with a history of chronic atrial fibrillation on Xarelto, nonobstructive CAD with previous stenting, and hypothyroidism, who presented from her PCPs office when she was found to be in atrial fibrillation with rapid ventricular response. The patient had mild shortness of breath, otherwise she reported no chest pain during the RVR. She recently underwent a heart catheterization on 07/26/15 which revealed a patent stent and nonobstructive CAD. In the ED, she was tachycardic with a heart rate ranging from 120s to the 150s. Her EKG revealed atrial fibrillation with rapid ventricular response. Her chest x-ray revealed right lower lobe opacity which could reflect infiltrate or atelectasis; mild to moderate cardiac enlargement with no evidence of pulmonary edema. Her initial blood work was unremarkable including a negative troponin I. She was admitted for further evaluation and management. Cardiology was consulted to assist with management of A. fib with RVR. On the night of 5/27 she had acute respiratory distress and was found to have pulmonary edema on chest x-ray. Has been started on IV Lasix and echocardiogram with worsening systolic function.  Assessment & Plan:   Principal Problem:   Atrial fibrillation with RVR (HCC) Active Problems:   Hyperlipidemia   Hypothyroidism   Coronary atherosclerosis of native coronary artery   CAP (community acquired pneumonia)   Hypotension   Metabolic acidosis   Acute systolic CHF (congestive heart failure) (HCC)   Chronic atrial fibrillation with RVR. -Had brief conversion to sinus rhythm at which point diltiazem and amiodarone drips were discontinued. -Has converted back to A. fib with RVR, was restarted on IV amiodarone load to be completed today and transitioned to oral.  -Toprol-XL dose has been  increased from 25-37.5 twice daily. -No plans for cardioversion at this time. -Continue Xarelto for anticoagulation.  Acute hypoxemic respiratory failure/acute systolic CHF -2-D echo: Left ventricle: The cavity size was normal. Wall thickness was  increased in a pattern of moderate LVH. Systolic function was  moderately reduced. The estimated ejection fraction was in the  range of 35% to 40%. Global hypokinesis with inferior akinesis,  incoordinate septal motion. The study is not technically  sufficient to allow evaluation of LV diastolic function. -Has had decrease in ejection fraction since cath earlier this month, presumably tachycardia mediated. -Is 4.3 L negative since admission. -Continue current dose of Lasix 40 mg daily. -Is on beta blocker, ACE inhibitor/ARB has not been started due to hypotension.  Coronary artery disease with previous stenting. -Patient underwent cardiac catheterization on 07/26/15 and revealed nonobstructive CAD and a patent stent in the left circumflex. Patient does not report chest pain. Her troponin I has been negative 3. Continue medical management.  Hypertension. -Well-controlled, in fact with an episode of hypotension earlier this admission.  ?Community-acquired pneumonia. -Patient's chest x-ray on admission revealed an infiltrate versus atelectasis.  -In absence of cough fever and leukocytosis, I believe pneumonia is less likely, however will continue treatment for 5 days. -Has completed a course of antibiotics.  Hypothyroidism. -TSH within normal limits. -Continue Synthroid.    DVT prophylaxis: Xarelto Code Status: Full code Family Communication: Discussed with daughter Disposition Plan: Discharge with clinically appropriate, likely in 24 hours   Consultants:   Cardiology  Procedures:   None  Antimicrobials:  None  Subjective: Sitting in chair, in good spirits, ready to be discharged home either later today or  tomorrow..  Objective: Filed Vitals:   08/14/15 0400 08/14/15 0500 08/14/15 0600 08/14/15 0700  BP: 123/86 136/73 134/103 129/91  Pulse: 83 93 117   Temp: 98.7 F (37.1 C)     TempSrc: Oral     Resp: 19 21 19 24   Height:      Weight:  90.6 kg (199 lb 11.8 oz)    SpO2: 93% 93% 95%    temperature 97.4.  Intake/Output Summary (Last 24 hours) at 08/14/15 0950 Last data filed at 08/14/15 0400  Gross per 24 hour  Intake 1578.3 ml  Output   1630 ml  Net  -51.7 ml   Filed Weights   08/11/15 0500 08/13/15 0500 08/14/15 0500  Weight: 95.7 kg (210 lb 15.7 oz) 90.6 kg (199 lb 11.8 oz) 90.6 kg (199 lb 11.8 oz)    Examination:  General exam: Appears calm and comfortable  Respiratory system: Breathing nonlabored at rest. No crackles auscultated. Cardiovascular system: Irregular, irregular, with borderline tachycardia. No pedal edema. Gastrointestinal system: Abdomen is mildly obese nondistended, soft and nontender. No organomegaly or masses felt. Normal bowel sounds heard. Central nervous system: Alert and oriented. No focal neurological deficits. Extremities: Symmetric 5 x 5 power, no lower extremity edema Skin: No rashes, lesions or ulcers Psychiatry: Judgement and insight appear normal. Mood & affect appropriate.     Data Reviewed: I have personally reviewed following labs and imaging studies  CBC:  Recent Labs Lab 08/08/15 1720 08/09/15 0840 08/11/15 0033 08/11/15 0505 08/12/15 0435  WBC 7.8 6.8 9.4 8.9 6.8  NEUTROABS 4.5  --   --   --   --   HGB 12.8 11.7* 10.9* 11.4* 11.1*  HCT 38.6 35.5* 33.7* 34.7* 32.9*  MCV 85.8 86.6 87.5 87.0 86.4  PLT 311 283 262 262 [id number]   Basic Metabolic Panel:  Recent Labs Lab 08/10/15 0458 08/11/15 0033 08/11/15 0505 08/12/15 0435 08/13/15 0228 08/14/15 0548  NA 139 137  --  137 139 137  K 3.6 3.3*  --  3.5 4.1 3.7  CL 108 106  --  102 101 98*  CO2 23 23  --  28 28 28   GLUCOSE 115* 140*  --  97 104* 105*  BUN 10 9  --  13 16  14   CREATININE 0.64 0.74  --  0.90 0.91 0.84  CALCIUM 9.0 8.8*  --  8.9 9.6 9.1  MG  --   --  1.7  --  1.8  --    GFR: Estimated Creatinine Clearance: 66.6 mL/min (by C-G formula based on Cr of 0.84). Liver Function Tests:  Recent Labs Lab 08/10/15 0458 08/11/15 0033  AST 15 21  ALT 14 16  ALKPHOS 70 70  BILITOT 0.5 0.4  PROT 6.8 6.7  ALBUMIN 3.8 3.7   No results for input(s): LIPASE, AMYLASE in the last 168 hours. No results for input(s): AMMONIA in the last 168 hours. Coagulation Profile: No results for input(s): INR, PROTIME in the last 168 hours. Cardiac Enzymes:  Recent Labs Lab 08/08/15 1720 08/08/15 2143 08/09/15 0240 08/09/15 0840  TROPONINI <0.03 <0.03 <0.03 <0.03   BNP (last 3 results) No results for input(s): PROBNP in the last 8760 hours. HbA1C: No results for input(s): HGBA1C in the last 72 hours. CBG: No results for input(s): GLUCAP in the last 168 hours. Lipid Profile: No results for input(s): CHOL, HDL, LDLCALC, TRIG, CHOLHDL, LDLDIRECT in the last 72 hours. Thyroid Function Tests: No results for input(s): TSH, T4TOTAL, FREET4, T3FREE,  THYROIDAB in the last 72 hours. Anemia Panel: No results for input(s): VITAMINB12, FOLATE, FERRITIN, TIBC, IRON, RETICCTPCT in the last 72 hours. Sepsis Labs: No results for input(s): PROCALCITON, LATICACIDVEN in the last 168 hours.  Recent Results (from the past 240 hour(s))  MRSA PCR Screening     Status: None   Collection Time: 08/08/15  8:19 PM  Result Value Ref Range Status   MRSA by PCR NEGATIVE NEGATIVE Final    Comment:        The GeneXpert MRSA Assay (FDA approved for NASAL specimens only), is one component of a comprehensive MRSA colonization surveillance program. It is not intended to diagnose MRSA infection nor to guide or monitor treatment for MRSA infections.          Radiology Studies: No results found.      Scheduled Meds: . amiodarone  400 mg Oral BID  . furosemide  40 mg  Oral Daily  . levothyroxine  112 mcg Oral QAC breakfast  . metoprolol succinate  37.5 mg Oral BID  . omega-3 acid ethyl esters  1 g Oral Daily  . pantoprazole  40 mg Oral Daily  . potassium chloride  20 mEq Oral BID  . rivaroxaban  20 mg Oral Q supper  . sodium chloride flush  3 mL Intravenous Q12H   Continuous Infusions: . amiodarone 30 mg/hr (08/14/15 0400)     LOS: 6 days    Time spent: 25 minutes    Lelon Frohlich, MD [facility] Pager [phone]  If 7PM-7AM, please contact night-coverage [email or web address] Password [email or web address] 08/14/2015, 9:50 AM

## 2015-08-19 ENCOUNTER — Encounter: Payer: Self-pay | Admitting: *Deleted

## 2015-08-19 ENCOUNTER — Telehealth: Payer: Self-pay | Admitting: Cardiology

## 2015-08-19 ENCOUNTER — Ambulatory Visit (INDEPENDENT_AMBULATORY_CARE_PROVIDER_SITE_OTHER): Payer: Medicare Other | Admitting: Cardiology

## 2015-08-19 ENCOUNTER — Encounter: Payer: Self-pay | Admitting: Cardiology

## 2015-08-19 VITALS — BP 148/100 | HR 114 | Ht 63.0 in | Wt 202.0 lb

## 2015-08-19 DIAGNOSIS — E039 Hypothyroidism, unspecified: Secondary | ICD-10-CM | POA: Diagnosis not present

## 2015-08-19 DIAGNOSIS — I2510 Atherosclerotic heart disease of native coronary artery without angina pectoris: Secondary | ICD-10-CM | POA: Diagnosis not present

## 2015-08-19 DIAGNOSIS — I10 Essential (primary) hypertension: Secondary | ICD-10-CM

## 2015-08-19 DIAGNOSIS — I483 Typical atrial flutter: Secondary | ICD-10-CM | POA: Diagnosis not present

## 2015-08-19 DIAGNOSIS — I952 Hypotension due to drugs: Secondary | ICD-10-CM | POA: Diagnosis not present

## 2015-08-19 MED ORDER — METOPROLOL SUCCINATE ER 25 MG PO TB24: 37.5000 mg | ORAL_TABLET | Freq: Two times a day (BID) | ORAL | Status: DC

## 2015-08-19 MED ORDER — AMIODARONE HCL 400 MG PO TABS: 400.0000 mg | ORAL_TABLET | Freq: Two times a day (BID) | ORAL | Status: DC

## 2015-08-19 NOTE — Telephone Encounter (Signed)
DCCV on [id number]  Checking percert

## 2015-08-19 NOTE — Progress Notes (Signed)
Cardiology Office Note  Date: 08/19/2015   ID: SADHANA CINTORA, DOB 1945/05/06, MRN [id number]  PCP: [name], MD  Primary Cardiologist: Rozann Lesches, MD   Chief Complaint  Patient presents with  . Atrial Fibrillation  . Hospitalization Follow-up    History of Present Illness: [name] Fry is a 70 y.o. female last seen in the office in early May. I reviewed her interval records. She was just recently discharged from [facility] having presented with rapid atrial fibrillation, I saw her in initial consultation. She was treated with intravenous amiodarone load with plan to proceed with TEE guided cardioversion If she did not convert (had been off Xarelto for 3 days around cardiac catheterization on May 12). She did ultimately convert transiently to sinus rhythm during hospital stay, but was in atrial fibrillation/flutter at the time of discharge. Medications were adjusted including a switch to oral amiodarone with increased dose Toprol-XL and Xarelto for stroke prophylaxis continuing. During hospitalization LVEF was noted to be reduced in the range of 35-40% by echocardiography. Dr. Harl Bowie followed the patient during the latter part of her stay and elected not to pursue cardioversion as yet with further amiodarone load.  She comes in today with her daughter for a follow-up visit. She voices a lot of frustration about her stay at the hospital. She states that she feels about the same in terms of fatigue, no definite sense of palpitations or chest pain. ECG today shows atrial flutter with 2:1 block and left bundle branch block.  Today we discussed her persistent arrhythmia and associated cardiomyopathy which is potentially tachycardia-mediated. I have once again recommended a cardioversion to try and restore sinus rhythm, hopefully she would now hold rhythm longer since being on amiodarone. She would like to wait until next week, and we are trying to get this scheduled for next Friday. She has  been on Xarelto since May 13, and therefore as of anticipated cardioversion on Friday, June 16 she will have been back on anticoagulation for greater than a month. She will not need a TEE at that point.  Past Medical History  Diagnosis Date  . Hypothyroidism   . Hyperlipidemia   . GERD (gastroesophageal reflux disease)   . Coronary atherosclerosis of native coronary artery     DES OM 3/12  . Essential hypertension, benign   . H/O hiatal hernia   . Arthritis   . Cataracts, bilateral   . Breast cancer ([place])     Left  . Atrial fibrillation [facility])     Documented January 2017    Past Surgical History  Procedure Laterality Date  . Mastectomy Left     1997; S/P chemotherapy/radiation  . Cholecystectomy    . Appendectomy    . Tubal ligation    . 25 gauge pars plana vitrectomy with 20 gauge mvr port Right 09/29/2012    Procedure: 25 GAUGE PARS PLANA VITRECTOMY WITH 20 GAUGE MVR PORT;  Surgeon: Hayden Pedro, MD;  Location: [place];  Service: Ophthalmology;  Laterality: Right;  . Laser photo ablation Right 09/29/2012    Procedure: LASER PHOTO ABLATION;  Surgeon: Hayden Pedro, MD;  Location: [place];  Service: Ophthalmology;  Laterality: Right;  Headscope laser  . Membrane peel Right 09/29/2012    Procedure: MEMBRANE PEEL;  Surgeon: Hayden Pedro, MD;  Location: [place];  Service: Ophthalmology;  Laterality: Right;  . Gas/fluid exchange Right 09/29/2012    Procedure: GAS/FLUID EXCHANGE;  Surgeon: Hayden Pedro, MD;  Location: [place] OR;  Service: Ophthalmology;  Laterality: Right;  . Cataract extraction w/phaco Right 05/18/2013    Procedure: CATARACT EXTRACTION PHACO AND INTRAOCULAR LENS PLACEMENT RIGHT EYE;  Surgeon: Tonny Branch, MD;  Location: [facility];  Service: Ophthalmology;  Laterality: Right;  CDE:13.35  . Cardiac catheterization N/A 07/26/2015    Procedure: Left Heart Cath and Coronary Angiography;  Surgeon: [name], MD;  Location: [place] [facility] LAB;  Service: Cardiovascular;   Laterality: N/A;    Current Outpatient Prescriptions  Medication Sig Dispense Refill  . amiodarone (PACERONE) 400 MG tablet Take 1 tablet (400 mg total) by mouth 2 (two) times daily. 08/19/15 Continue for for 2 more days then reduce to 200 mg twice daily 60 tablet 2  . furosemide (LASIX) 40 MG tablet Take 1 tablet (40 mg total) by mouth daily as needed for fluid (or weight gain). 30 tablet 2  . levothyroxine (SYNTHROID, LEVOTHROID) 112 MCG tablet Take 112 mcg by mouth daily.     . metoprolol succinate (TOPROL-XL) 25 MG 24 hr tablet Take 1.5 tablets (37.5 mg total) by mouth 2 (two) times daily. 90 tablet 3  . nitroGLYCERIN (NITROSTAT) 0.4 MG SL tablet Place 1 tablet (0.4 mg total) under the tongue every 5 (five) minutes x 3 doses as needed for chest pain. 25 tablet 3  . Omega-3 Fatty Acids (FISH OIL) 1000 MG CAPS Take 1 capsule (1,000 mg total) by mouth 2 (two) times daily. (Patient taking differently: Take 1,200 mg by mouth 2 (two) times daily. )    . rivaroxaban (XARELTO) 20 MG TABS tablet Take 1 tablet (20 mg total) by mouth daily with supper. 28 tablet 0  . Turmeric 500 MG CAPS Take 500 mg by mouth daily. Reported on 04/11/2015    . Multiple Vitamins-Minerals (OCUVITE PRESERVISION PO) Take 2 tablets by mouth daily.     No current facility-administered medications for this visit.   Allergies:  Doxycycline   Social History: The patient  reports that she has never smoked. She has never used smokeless tobacco. She reports that she does not drink alcohol or use illicit drugs.   ROS:  Please see the history of present illness. Otherwise, complete review of systems is positive for fatigue.  All other systems are reviewed and negative.   Physical Exam: VS:  BP 148/100 mmHg  Pulse 114  Ht 5\' 3"  (1.6 m)  Wt 202 lb (91.627 kg)  BMI 35.79 kg/m2  SpO2 98%  LMP 09/28/2012, BMI Body mass index is 35.79 kg/(m^2).  Wt Readings from Last 3 Encounters:  08/19/15 202 lb (91.627 kg)  08/14/15 199 lb  11.8 oz (90.6 kg)  07/23/15 210 lb (95.255 kg)    Overweight woman, no distress.Marland Kitchen  HEENT: Conjunctiva and lids normal, oropharynx with moist mucosa.  Neck: Supple, no elevated JVP or bruits.  Lungs: Clear to auscultation, nonlabored.  Cardiac: Regular rate and rhythm, no S3.  Abdomen: Soft, nontender, bowel sounds present.  Skin: Warm and dry.  Extremities: No pitting edema. Musculoskeletal: No kyphosis. Neuropsychiatric: Alert and oriented 3, affect appropriate.  ECG: I personally reviewed the tracing from 08/14/2015 which showed atrial fibrillation/flutter with left bundle branch block.  Recent Labwork: 08/08/2015: TSH 3.876 08/11/2015: ALT 16; AST 21 08/12/2015: Hemoglobin 11.1*; Platelets 222 08/13/2015: Magnesium 1.8 08/14/2015: BUN 14; Creatinine, Ser 0.84; Potassium 3.7; Sodium 137   Other Studies Reviewed Today:  Echocardiogram 08/11/2015: Study Conclusions  - Left ventricle: The cavity size was normal. Wall thickness was  increased in a  pattern of moderate LVH. Systolic function was  moderately reduced. The estimated ejection fraction was in the  range of 35% to 40%. Global hypokinesis with inferior akinesis,  incoordinate septal motion. The study is not technically  sufficient to allow evaluation of LV diastolic function. Ejection  fraction (MOD, 2-plane): 40%. - Aortic valve: Trileaflet. Sclerosis without stenosis. There was  trivial regurgitation. - Mitral valve: Calcified annulus. Mildly thickened leaflets .  There was moderate regurgitation. - Left atrium: The atrium was normal in size. - Right ventricle: The cavity size was normal. - Right atrium: The atrium was mildly dilated. - Tricuspid valve: There was moderate regurgitation. - Pulmonary arteries: PA peak pressure: 30 mm Hg (S) + RAP. - Systemic veins: Not visualized. - Pericardium, extracardiac: A trivial pericardial effusion was  identified posterior to the heart. There was a left  pleural  effusion.  Impressions:  - LVEF 35-40%, moderate LVH, global hypokinesis with inferior  akinesis and incoordinate septal motion, aortic valve sclerosis  with trivial AI, mitral annular calcification with moderate MR,  moderate TR, RVSP 30 mmHG + right atrial pressure, IVC not  visualized, trivial posterior pericardial effusion and left  pleural effusion.  Chest x-ray 08/11/2015: FINDINGS: The lungs are well-aerated. Small bilateral pleural effusions are noted, left greater than right. Mild bibasilar airspace opacities may reflect interstitial edema, given vascular congestion. There is no evidence of pneumothorax.  The cardiomediastinal silhouette is borderline normal in size. No acute osseous abnormalities are seen. Scattered clips are seen at the left axilla.  IMPRESSION: Small bilateral pleural effusions, left greater than right. Mild bibasilar airspace opacities may reflect interstitial edema, given vascular congestion.  Assessment and Plan:  1. Persistent atrial fibrillation/flutter, currently in atrial flutter with 2:1 block. As discussed above plan is to schedule an elective cardioversion for next Friday, June 16. She will continue with amiodarone 400 mg twice daily for the next 2 days, then down to 200 mg twice daily. On the day of cardioversion she will hold Toprol-XL for the morning but otherwise continue her remaining medications. She will not need a TEE at that time since being back on anticoagulation for over a month.  2. CAD status post DES to the obtuse marginal in 2012, patent at angiography on May 12.  3. Essential hypertension, blood pressure elevated today.  4. Hypothyroidism, continues on Synthroid with recent normal TSH.  Current medicines were reviewed with the patient today.   Orders Placed This Encounter  Procedures  . EKG 12-Lead    Disposition: FU with me after cardioversion.  Signed, Satira Sark, MD, [facility] 08/19/2015  5:03 PM    [facility] [place], [place], [place] [address] Phone: [phone]; Fax: [phone]

## 2015-08-19 NOTE — Patient Instructions (Addendum)
Your physician recommends that you continue on your current medications as directed. Please refer to the Current Medication list given to you today. Please continue your amiodarone 400 mg twice daily for 2 more days; then, reduce to 200 mg twice daily. You may break your 400 mg tablets in half twice daily. Your physician has recommended that you have a Cardioversion (DCCV). Electrical Cardioversion uses a jolt of electricity to your heart either through paddles or wired patches attached to your chest. This is a controlled, usually prescheduled, procedure. Defibrillation is done under light anesthesia in the hospital, and you usually go home the day of the procedure. This is done to get your heart back into a normal rhythm. You are not awake for the procedure. Please see the instruction sheet given to you today.  Your physician recommends that you schedule a follow-up appointment in: 2-3 weeks after DCCV.

## 2015-08-20 ENCOUNTER — Other Ambulatory Visit: Payer: Self-pay | Admitting: Cardiology

## 2015-08-20 DIAGNOSIS — I4891 Unspecified atrial fibrillation: Secondary | ICD-10-CM

## 2015-08-23 NOTE — Patient Instructions (Signed)
[name] Fry  08/23/2015     @PREFPERIOPPHARMACY @   Your procedure is scheduled on 08/30/2015.  Report to [facility] at 8:30 A.M.  Call this number if you have problems the morning of surgery:  [phone]   Remember:  Do not eat food or drink liquids after midnight.  Take these medicines the morning of surgery with A SIP OF WATER Pacerone, Synthroid,  Continue taking Xarelto and do not miss any doses   DO NOT TAKE TOPROL XL MORNING OF PROCEDURE   Do not wear jewelry, make-up or nail polish.  Do not wear lotions, powders, or perfumes.  You may wear deodorant.  Do not shave 48 hours prior to surgery.  Men may shave face and neck.  Do not bring valuables to the hospital.  [facility] is not responsible for any belongings or valuables.  Contacts, dentures or bridgework may not be worn into surgery.  Leave your suitcase in the car.  After surgery it may be brought to your room.  For patients admitted to the hospital, discharge time will be determined by your treatment team.  Patients discharged the day of surgery will not be allowed to drive home.    Please read over the following fact sheets that you were given. Anesthesia Post-op Instructions     PATIENT INSTRUCTIONS POST-ANESTHESIA  IMMEDIATELY FOLLOWING SURGERY:  Do not drive or operate machinery for the first twenty four hours after surgery.  Do not make any important decisions for twenty four hours after surgery or while taking narcotic pain medications or sedatives.  If you develop intractable nausea and vomiting or a severe headache please notify your doctor immediately.  FOLLOW-UP:  Please make an appointment with your surgeon as instructed. You do not need to follow up with anesthesia unless specifically instructed to do so.  WOUND CARE INSTRUCTIONS (if applicable):  Keep a dry clean dressing on the anesthesia/puncture wound site if there is drainage.  Once the wound has quit draining you may leave it open to air.   Generally you should leave the bandage intact for twenty four hours unless there is drainage.  If the epidural site drains for more than 36-48 hours please call the anesthesia department.  QUESTIONS?:  Please feel free to call your physician or the hospital operator if you have any questions, and they will be happy to assist you.      Electrical Cardioversion Electrical cardioversion is the delivery of a jolt of electricity to change the rhythm of the heart. Sticky patches or metal paddles are placed on the chest to deliver the electricity from a device. This is done to restore a normal rhythm. A rhythm that is too fast or not regular keeps the heart from pumping well. Electrical cardioversion is done in an emergency if:   There is low or no blood pressure as a result of the heart rhythm.   Normal rhythm must be restored as fast as possible to protect the brain and heart from further damage.   It may save a life. Cardioversion may be done for heart rhythms that are not immediately life threatening, such as atrial fibrillation or flutter, in which:   The heart is beating too fast or is not regular.   Medicine to change the rhythm has not worked.   It is safe to wait in order to allow time for preparation.  Symptoms of the abnormal rhythm are bothersome.  The risk of stroke and other serious problems can be  reduced. LET [facility] CARE PROVIDER KNOW ABOUT:   Any allergies you have.  All medicines you are taking, including vitamins, herbs, eye drops, creams, and over-the-counter medicines.  Previous problems you or members of your family have had with the use of anesthetics.   Any blood disorders you have.   Previous surgeries you have had.   Medical conditions you have. RISKS AND COMPLICATIONS  Generally, this is a safe procedure. However, problems can occur and include:   Breathing problems related to the anesthetic used.  A blood clot that breaks free and travels to  other parts of your body. This could cause a stroke or other problems. The risk of this is lowered by use of blood-thinning medicine (anticoagulant) prior to the procedure.  Cardiac arrest (rare). BEFORE THE PROCEDURE   You may have tests to detect blood clots in your heart and to evaluate heart function.  You may start taking anticoagulants so your blood does not clot as easily.   Medicines may be given to help stabilize your heart rate and rhythm. PROCEDURE  You will be given medicine through an IV tube to reduce discomfort and make you sleepy (sedative).   An electrical shock will be delivered. AFTER THE PROCEDURE Your heart rhythm will be watched to make sure it does not change.    This information is not intended to replace advice given to you by your health care provider. Make sure you discuss any questions you have with your health care provider.   Document Released: 02/20/2002 Document Revised: 03/23/2014 Document Reviewed: 09/14/2012 Elsevier Interactive Patient Education [employer].

## 2015-08-26 ENCOUNTER — Encounter (HOSPITAL_COMMUNITY): Payer: Self-pay

## 2015-08-26 ENCOUNTER — Encounter (HOSPITAL_COMMUNITY)
Admission: RE | Admit: 2015-08-26 | Discharge: 2015-08-26 | Disposition: A | Discharge status: Home / Self Care | Payer: Medicare Other | Source: Ambulatory Visit | Attending: Cardiology | Admitting: Cardiology

## 2015-08-26 DIAGNOSIS — Z01812 Encounter for preprocedural laboratory examination: Secondary | ICD-10-CM | POA: Insufficient documentation

## 2015-08-26 DIAGNOSIS — I4891 Unspecified atrial fibrillation: Secondary | ICD-10-CM | POA: Insufficient documentation

## 2015-08-26 LAB — COMPREHENSIVE METABOLIC PANEL
ALT: 20 U/L (ref 14–54)
AST: 21 U/L (ref 15–41)
Albumin: 3.8 g/dL (ref 3.5–5.0)
Alkaline Phosphatase: 77 U/L (ref 38–126)
Anion gap: 8 (ref 5–15)
BUN: 16 mg/dL (ref 6–20)
CO2: 30 mmol/L (ref 22–32)
Calcium: 9.2 mg/dL (ref 8.9–10.3)
Chloride: 101 mmol/L (ref 101–111)
Creatinine, Ser: 0.97 mg/dL (ref 0.44–1.00)
GFR calc Af Amer: >60 mL/min (ref >60)
GFR calc non Af Amer: 58 mL/min — ABNORMAL LOW (ref >60)
Glucose, Bld: 105 mg/dL — ABNORMAL HIGH (ref 65–99)
Potassium: 3.7 mmol/L (ref 3.5–5.1)
Sodium: 139 mmol/L (ref 135–145)
Total Bilirubin: 0.4 mg/dL (ref 0.3–1.2)
Total Protein: 6.4 g/dL — ABNORMAL LOW (ref 6.5–8.1)

## 2015-08-26 LAB — CBC WITH DIFFERENTIAL/PLATELET
Basophils Absolute: 0.1 10*3/uL (ref 0.0–0.1)
Basophils Relative: 1 %
EOS PCT: 3 %
Eosinophils Absolute: 0.2 10*3/uL (ref 0.0–0.7)
HCT: 32.4 % — ABNORMAL LOW (ref 36.0–46.0)
Hemoglobin: 10.9 g/dL — ABNORMAL LOW (ref 12.0–15.0)
LYMPHS ABS: 2.9 10*3/uL (ref 0.7–4.0)
LYMPHS PCT: 38 %
MCH: 29.1 pg (ref 26.0–34.0)
MCHC: 33.6 g/dL (ref 30.0–36.0)
MCV: 86.6 fL (ref 78.0–100.0)
MONO ABS: 0.5 10*3/uL (ref 0.1–1.0)
Monocytes Relative: 6 %
Neutro Abs: 3.9 10*3/uL (ref 1.7–7.7)
Neutrophils Relative %: 52 %
PLATELETS: 278 10*3/uL (ref 150–400)
RBC: 3.74 MIL/uL — AB (ref 3.87–5.11)
RDW: 13.8 % (ref 11.5–15.5)
WBC: 7.5 10*3/uL (ref 4.0–10.5)

## 2015-08-30 ENCOUNTER — Telehealth: Payer: Self-pay | Admitting: *Deleted

## 2015-08-30 ENCOUNTER — Ambulatory Visit (HOSPITAL_COMMUNITY): Payer: Medicare Other | Admitting: Anesthesiology

## 2015-08-30 ENCOUNTER — Encounter (HOSPITAL_COMMUNITY): Payer: Self-pay | Admitting: *Deleted

## 2015-08-30 ENCOUNTER — Ambulatory Visit (HOSPITAL_COMMUNITY)
Admission: RE | Admit: 2015-08-30 | Discharge: 2015-08-30 | Disposition: A | Discharge status: Home / Self Care | Payer: Medicare Other | Source: Ambulatory Visit | Attending: Cardiology | Admitting: Cardiology

## 2015-08-30 ENCOUNTER — Encounter (HOSPITAL_COMMUNITY)
Admission: RE | Disposition: A | Discharge status: Home / Self Care | Payer: Self-pay | Source: Ambulatory Visit | Attending: Cardiology

## 2015-08-30 DIAGNOSIS — I2510 Atherosclerotic heart disease of native coronary artery without angina pectoris: Secondary | ICD-10-CM | POA: Diagnosis not present

## 2015-08-30 DIAGNOSIS — I10 Essential (primary) hypertension: Secondary | ICD-10-CM | POA: Diagnosis not present

## 2015-08-30 DIAGNOSIS — E039 Hypothyroidism, unspecified: Secondary | ICD-10-CM | POA: Insufficient documentation

## 2015-08-30 DIAGNOSIS — I481 Persistent atrial fibrillation: Secondary | ICD-10-CM | POA: Insufficient documentation

## 2015-08-30 DIAGNOSIS — Z7901 Long term (current) use of anticoagulants: Secondary | ICD-10-CM | POA: Diagnosis not present

## 2015-08-30 DIAGNOSIS — M1990 Unspecified osteoarthritis, unspecified site: Secondary | ICD-10-CM | POA: Diagnosis not present

## 2015-08-30 DIAGNOSIS — E785 Hyperlipidemia, unspecified: Secondary | ICD-10-CM | POA: Diagnosis not present

## 2015-08-30 DIAGNOSIS — Z79899 Other long term (current) drug therapy: Secondary | ICD-10-CM | POA: Diagnosis not present

## 2015-08-30 DIAGNOSIS — I4891 Unspecified atrial fibrillation: Secondary | ICD-10-CM

## 2015-08-30 DIAGNOSIS — K219 Gastro-esophageal reflux disease without esophagitis: Secondary | ICD-10-CM | POA: Diagnosis not present

## 2015-08-30 DIAGNOSIS — I4819 Other persistent atrial fibrillation: Secondary | ICD-10-CM | POA: Insufficient documentation

## 2015-08-30 HISTORY — PX: CARDIOVERSION: SHX1299

## 2015-08-30 SURGERY — CARDIOVERSION
Anesthesia: Monitor Anesthesia Care

## 2015-08-30 MED ORDER — HYDROCORTISONE 1 % EX CREA
1.0000 "application " | TOPICAL_CREAM | Freq: Three times a day (TID) | CUTANEOUS | Status: DC | PRN
  Filled 2015-08-30: qty 28

## 2015-08-30 MED ORDER — LACTATED RINGERS IV SOLN
INTRAVENOUS | Status: DC
  Administered 2015-08-30: 09:00:00 via INTRAVENOUS

## 2015-08-30 MED ORDER — MIDAZOLAM HCL 2 MG/2ML IJ SOLN
INTRAMUSCULAR | Status: AC
  Filled 2015-08-30: qty 2

## 2015-08-30 MED ORDER — METOPROLOL SUCCINATE ER 50 MG PO TB24: 50.0000 mg | ORAL_TABLET | Freq: Every evening | ORAL | Status: DC

## 2015-08-30 MED ORDER — ONDANSETRON HCL 4 MG/2ML IJ SOLN: 4.0000 mg | Freq: Once | INTRAMUSCULAR | Status: DC | PRN

## 2015-08-30 MED ORDER — MIDAZOLAM HCL 2 MG/2ML IJ SOLN
1.0000 mg | INTRAMUSCULAR | Status: DC | PRN
  Administered 2015-08-30: 2 mg via INTRAVENOUS

## 2015-08-30 MED ORDER — FENTANYL CITRATE (PF) 100 MCG/2ML IJ SOLN
25.0000 ug | INTRAMUSCULAR | Status: DC | PRN
  Administered 2015-08-30: 25 ug via INTRAVENOUS

## 2015-08-30 MED ORDER — FENTANYL CITRATE (PF) 100 MCG/2ML IJ SOLN
INTRAMUSCULAR | Status: AC
  Filled 2015-08-30: qty 2

## 2015-08-30 MED ORDER — AMIODARONE HCL 200 MG PO TABS: 200.0000 mg | ORAL_TABLET | Freq: Two times a day (BID) | ORAL | Status: DC

## 2015-08-30 MED ORDER — FENTANYL CITRATE (PF) 100 MCG/2ML IJ SOLN: 25.0000 ug | INTRAMUSCULAR | Status: DC | PRN

## 2015-08-30 MED ORDER — MIDAZOLAM HCL 5 MG/5ML IJ SOLN
INTRAMUSCULAR | Status: DC | PRN
  Administered 2015-08-30: 1 mg via INTRAVENOUS

## 2015-08-30 MED ORDER — MEPERIDINE HCL 100 MG/ML IJ SOLN
INTRAMUSCULAR | Status: AC
  Filled 2015-08-30: qty 1

## 2015-08-30 MED ORDER — PROPOFOL 10 MG/ML IV BOLUS
INTRAVENOUS | Status: DC | PRN
  Administered 2015-08-30 (×2): 10 mg via INTRAVENOUS

## 2015-08-30 MED ORDER — PROPOFOL 500 MG/50ML IV EMUL
INTRAVENOUS | Status: DC | PRN
  Administered 2015-08-30: 150 ug/kg/min via INTRAVENOUS

## 2015-08-30 NOTE — CV Procedure (Signed)
Elective direct-current cardioversion  Indication: Persistent, symptomatic atrial fibrillation/flutter  Description of procedure: Lab work reviewed. Informed consent was obtained. Verified medications including consistent use of Xarelto. Patient held Toprol-XL dose this morning. Taken to procedure suite. Timeout performed. Sedation was achieved per the Anesthesia service with the use of propofol. Using anterior and posterior pad placement with sandbag on anterior pad, a single synchronized shock at 120 J was delivered with successful restoration of sinus rhythm. Patient remained hemodynamically stable and there were no immediate complications.  [name] Fry, M.D., F.A.C.C.

## 2015-08-30 NOTE — Telephone Encounter (Signed)
Patient aware of medication changes and up coming appointment.

## 2015-08-30 NOTE — Anesthesia Preprocedure Evaluation (Signed)
Anesthesia Evaluation  Patient identified by MRN, date of birth, ID band Patient awake    Reviewed: Allergy & Precautions, H&P , NPO status , Patient's Chart, lab work & pertinent test results  Airway Mallampati: III  TM Distance: >3 FB Neck ROM: full  Mouth opening: Limited Mouth Opening  Dental  (+) Teeth Intact   Pulmonary neg pulmonary ROS, shortness of breath and with exertion,    breath sounds clear to auscultation       Cardiovascular hypertension, Pt. on medications + CAD, + Cardiac Stents and +CHF  + dysrhythmias Atrial Fibrillation  Rhythm:Irregular Rate:Normal     Neuro/Psych    GI/Hepatic hiatal hernia, GERD  Medicated,  Endo/Other  Hypothyroidism obese  Renal/GU      Musculoskeletal  (+) Arthritis ,   Abdominal   Peds  Hematology   Anesthesia Other Findings   Reproductive/Obstetrics                             Anesthesia Physical Anesthesia Plan  ASA: III  Anesthesia Plan: MAC   Post-op Pain Management:    Induction: Intravenous  Airway Management Planned: Simple Face Mask  Additional Equipment:   Intra-op Plan:   Post-operative Plan:   Informed Consent: I have reviewed the patients History and Physical, chart, labs and discussed the procedure including the risks, benefits and alternatives for the proposed anesthesia with the patient or authorized representative who has indicated his/her understanding and acceptance.     Plan Discussed with:   Anesthesia Plan Comments:         Anesthesia Quick Evaluation

## 2015-08-30 NOTE — Progress Notes (Signed)
Electrical Cardioversion Procedure Note [name] Fry PO:[id number] April 12, 1945  Procedure: Electrical Cardioversion Indications:  Persistent atrial fib and flutter  Procedure Details Consent:yes Time Out: Verified patient identification, verified procedure, site/side was marked, verified correct patient position, special equipment/implants available, medications/allergies/relevent history reviewed, required imaging and test results available.  Time out 0957 Patient placed on cardiac monitor, pulse oximetry, supplemental oxygen as necessary.  Sedation given:yes Pacer pads placed yes Cardioverted1time(s).  Cardioverted at 120 joules Evaluation Findings: Post procedure EKG shows: sinus brady Complications:none Patient did tolerate procedure well.   Hillery Jacks 08/30/2015, 10:27 AM

## 2015-08-30 NOTE — H&P (Signed)
Cardiology Office Note  Date: 08/19/2015   ID: KHRISTI WAGLEY, DOB [birth year], MRN [id number]  PCP: [name], MD Primary Cardiologist: Rozann Lesches, MD   Chief Complaint  Patient presents with  . Atrial Fibrillation  . Hospitalization Follow-up    History of Present Illness: [name] Fry is a 70 y.o. female last seen in the office in early May. I reviewed her interval records. She was just recently discharged from [facility] having presented with rapid atrial fibrillation, I saw her in initial consultation. She was treated with intravenous amiodarone load with plan to proceed with TEE guided cardioversion If she did not convert (had been off Xarelto for 3 days around cardiac catheterization on May 12). She did ultimately convert transiently to sinus rhythm during hospital stay, but was in atrial fibrillation/flutter at the time of discharge. Medications were adjusted including a switch to oral amiodarone with increased dose Toprol-XL and Xarelto for stroke prophylaxis continuing. During hospitalization LVEF was noted to be reduced in the range of 35-40% by echocardiography. Dr. Harl Bowie followed the patient during the latter part of her stay and elected not to pursue cardioversion as yet with further amiodarone load.  She comes in today with her daughter for a follow-up visit. She voices a lot of frustration about her stay at the hospital. She states that she feels about the same in terms of fatigue, no definite sense of palpitations or chest pain. ECG today shows atrial flutter with 2:1 block and left bundle branch block.  Today we discussed her persistent arrhythmia and associated cardiomyopathy which is potentially tachycardia-mediated. I have once again recommended a cardioversion to try and restore sinus rhythm, hopefully she would now hold rhythm longer since being on amiodarone. She would like to wait until next week, and we are trying to get this scheduled for next Friday. She  has been on Xarelto since May 13, and therefore as of anticipated cardioversion on Friday, June 16 she will have been back on anticoagulation for greater than a month. She will not need a TEE at that point.  Past Medical History  Diagnosis Date  . Hypothyroidism   . Hyperlipidemia   . GERD (gastroesophageal reflux disease)   . Coronary atherosclerosis of native coronary artery     DES OM 3/12  . Essential hypertension, benign   . H/O hiatal hernia   . Arthritis   . Cataracts, bilateral   . Breast cancer ([place])     Left  . Atrial fibrillation [facility])     Documented January 2017    Past Surgical History  Procedure Laterality Date  . Mastectomy Left     1997; S/P chemotherapy/radiation  . Cholecystectomy    . Appendectomy    . Tubal ligation    . 25 gauge pars plana vitrectomy with 20 gauge mvr port Right 09/29/2012    Procedure: 25 GAUGE PARS PLANA VITRECTOMY WITH 20 GAUGE MVR PORT; Surgeon: Hayden Pedro, MD; Location: [place]; Service: Ophthalmology; Laterality: Right;  . Laser photo ablation Right 09/29/2012    Procedure: LASER PHOTO ABLATION; Surgeon: Hayden Pedro, MD; Location: [place]; Service: Ophthalmology; Laterality: Right; Headscope laser  . Membrane peel Right 09/29/2012    Procedure: MEMBRANE PEEL; Surgeon: Hayden Pedro, MD; Location: [place]; Service: Ophthalmology; Laterality: Right;  . Gas/fluid exchange Right 09/29/2012    Procedure: GAS/FLUID EXCHANGE; Surgeon: Hayden Pedro, MD; Location: [place]; Service: Ophthalmology; Laterality: Right;  . Cataract extraction w/phaco Right 05/18/2013  Procedure: CATARACT EXTRACTION PHACO AND INTRAOCULAR LENS PLACEMENT RIGHT EYE; Surgeon: Tonny Branch, MD; Location: [facility]; Service: Ophthalmology; Laterality: Right; CDE:13.35  . Cardiac catheterization N/A 07/26/2015    Procedure: Left Heart Cath and  Coronary Angiography; Surgeon: [name], MD; Location: [facility]; Service: Cardiovascular; Laterality: N/A;    Current Outpatient Prescriptions  Medication Sig Dispense Refill  . amiodarone (PACERONE) 400 MG tablet Take 1 tablet (400 mg total) by mouth 2 (two) times daily. 08/19/15 Continue for for 2 more days then reduce to 200 mg twice daily 60 tablet 2  . furosemide (LASIX) 40 MG tablet Take 1 tablet (40 mg total) by mouth daily as needed for fluid (or weight gain). 30 tablet 2  . levothyroxine (SYNTHROID, LEVOTHROID) 112 MCG tablet Take 112 mcg by mouth daily.     . metoprolol succinate (TOPROL-XL) 25 MG 24 hr tablet Take 1.5 tablets (37.5 mg total) by mouth 2 (two) times daily. 90 tablet 3  . nitroGLYCERIN (NITROSTAT) 0.4 MG SL tablet Place 1 tablet (0.4 mg total) under the tongue every 5 (five) minutes x 3 doses as needed for chest pain. 25 tablet 3  . Omega-3 Fatty Acids (FISH OIL) 1000 MG CAPS Take 1 capsule (1,000 mg total) by mouth 2 (two) times daily. (Patient taking differently: Take 1,200 mg by mouth 2 (two) times daily. )    . rivaroxaban (XARELTO) 20 MG TABS tablet Take 1 tablet (20 mg total) by mouth daily with supper. 28 tablet 0  . Turmeric 500 MG CAPS Take 500 mg by mouth daily. Reported on 04/11/2015    . Multiple Vitamins-Minerals (OCUVITE PRESERVISION PO) Take 2 tablets by mouth daily.     No current facility-administered medications for this visit.   Allergies: Doxycycline   Social History: The patient  reports that she has never smoked. She has never used smokeless tobacco. She reports that she does not drink alcohol or use illicit drugs.   ROS: Please see the history of present illness. Otherwise, complete review of systems is positive for fatigue. All other systems are reviewed and negative.   Physical Exam: VS: BP 148/100 mmHg  Pulse 114  Ht 5\' 3"  (1.6 m)  Wt 202 lb (91.627 kg)  BMI  35.79 kg/m2  SpO2 98%  LMP 09/28/2012, BMI Body mass index is 35.79 kg/(m^2).  Wt Readings from Last 3 Encounters:  08/19/15 202 lb (91.627 kg)  08/14/15 199 lb 11.8 oz (90.6 kg)  07/23/15 210 lb (95.255 kg)    Overweight woman, no distress.Marland Kitchen  HEENT: Conjunctiva and lids normal, oropharynx with moist mucosa.  Neck: Supple, no elevated JVP or bruits.  Lungs: Clear to auscultation, nonlabored.  Cardiac: Regular rate and rhythm, no S3.  Abdomen: Soft, nontender, bowel sounds present.  Skin: Warm and dry.  Extremities: No pitting edema. Musculoskeletal: No kyphosis. Neuropsychiatric: Alert and oriented 3, affect appropriate.  ECG: I personally reviewed the tracing from 08/14/2015 which showed atrial fibrillation/flutter with left bundle branch block.  Recent Labwork: 08/08/2015: TSH 3.876 08/11/2015: ALT 16; AST 21 08/12/2015: Hemoglobin 11.1*; Platelets 222 08/13/2015: Magnesium 1.8 08/14/2015: BUN 14; Creatinine, Ser 0.84; Potassium 3.7; Sodium 137   Other Studies Reviewed Today:  Echocardiogram 08/11/2015: Study Conclusions  - Left ventricle: The cavity size was normal. Wall thickness was  increased in a pattern of moderate LVH. Systolic function was  moderately reduced. The estimated ejection fraction was in the  range of 35% to 40%. Global hypokinesis with inferior akinesis,  incoordinate septal motion.  The study is not technically  sufficient to allow evaluation of LV diastolic function. Ejection  fraction (MOD, 2-plane): 40%. - Aortic valve: Trileaflet. Sclerosis without stenosis. There was  trivial regurgitation. - Mitral valve: Calcified annulus. Mildly thickened leaflets .  There was moderate regurgitation. - Left atrium: The atrium was normal in size. - Right ventricle: The cavity size was normal. - Right atrium: The atrium was mildly dilated. - Tricuspid valve: There was moderate regurgitation. - Pulmonary arteries: PA peak pressure: 30 mm  Hg (S) + RAP. - Systemic veins: Not visualized. - Pericardium, extracardiac: A trivial pericardial effusion was  identified posterior to the heart. There was a left pleural  effusion.  Impressions:  - LVEF 35-40%, moderate LVH, global hypokinesis with inferior  akinesis and incoordinate septal motion, aortic valve sclerosis  with trivial AI, mitral annular calcification with moderate MR,  moderate TR, RVSP 30 mmHG + right atrial pressure, IVC not  visualized, trivial posterior pericardial effusion and left  pleural effusion.  Chest x-ray 08/11/2015: FINDINGS: The lungs are well-aerated. Small bilateral pleural effusions are noted, left greater than right. Mild bibasilar airspace opacities may reflect interstitial edema, given vascular congestion. There is no evidence of pneumothorax.  The cardiomediastinal silhouette is borderline normal in size. No acute osseous abnormalities are seen. Scattered clips are seen at the left axilla.  IMPRESSION: Small bilateral pleural effusions, left greater than right. Mild bibasilar airspace opacities may reflect interstitial edema, given vascular congestion.  Assessment and Plan:  1. Persistent atrial fibrillation/flutter, currently in atrial flutter with 2:1 block. As discussed above plan is to schedule an elective cardioversion for next Friday, June 16. She will continue with amiodarone 400 mg twice daily for the next 2 days, then down to 200 mg twice daily. On the day of cardioversion she will hold Toprol-XL for the morning but otherwise continue her remaining medications. She will not need a TEE at that time since being back on anticoagulation for over a month.  2. CAD status post DES to the obtuse marginal in 2012, patent at angiography on May 12.  3. Essential hypertension, blood pressure elevated today.  4. Hypothyroidism, continues on Synthroid with recent normal TSH.  Current medicines were reviewed with the patient  today.  Orders Placed This Encounter  Procedures  . EKG 12-Lead    Disposition: FU with me after cardioversion.  Signed, Satira Sark, MD, [facility] 08/19/2015 5:03 PM  [facility], [place], [place] [address] Phone: [phone]; Fax: [phone]       Addendum:  Patient presents as scheduled for elective cardioversion for persistent symptomatic atrial fibrillation/flutter. No change in clinical status since office visit. Patient is in agreement to proceed. Labwork and ECG reviewed. Consent obtained.  Satira Sark, M.D., F.A.C.C.

## 2015-08-30 NOTE — Anesthesia Postprocedure Evaluation (Signed)
Anesthesia Post Note  Patient: [name] Fry  Procedure(s) Performed: Procedure(s) (LRB): CARDIOVERSION (N/A)  Patient location during evaluation: PACU Anesthesia Type: MAC Level of consciousness: awake and alert and oriented Pain management: pain level controlled Vital Signs Assessment: post-procedure vital signs reviewed and stable Respiratory status: spontaneous breathing Cardiovascular status: blood pressure returned to baseline Postop Assessment: no signs of nausea or vomiting Anesthetic complications: no    Last Vitals:  Filed Vitals:   08/30/15 0945 08/30/15 1015  BP:  94/56  Pulse:  50  Temp:    Resp: 23 19    Last Pain: There were no vitals filed for this visit.               Rashawnda Gaba

## 2015-08-30 NOTE — Telephone Encounter (Signed)
[name] Sark, MD   Sent: Fri August 30, 2015 12:39 PM    To: [name], LPN        Message     Patient successfully cardioverted. I changed her Toprol-XL to 50 mg once per evening and amiodarone 200 mg once in the morning. Make sure she has office follow-up already scheduled please.

## 2015-08-30 NOTE — Discharge Instructions (Signed)
PATIENT INSTRUCTIONS °POST-ANESTHESIA ° °IMMEDIATELY FOLLOWING SURGERY:  Do not drive or operate machinery for the first twenty four hours after surgery.  Do not make any important decisions for twenty four hours after surgery or while taking narcotic pain medications or sedatives.  If you develop intractable nausea and vomiting or a severe headache please notify your doctor immediately. ° °FOLLOW-UP:  Please make an appointment with your surgeon as instructed. You do not need to follow up with anesthesia unless specifically instructed to do so. ° °WOUND CARE INSTRUCTIONS (if applicable):  Keep a dry clean dressing on the anesthesia/puncture wound site if there is drainage.  Once the wound has quit draining you may leave it open to air.  Generally you should leave the bandage intact for twenty four hours unless there is drainage.  If the epidural site drains for more than 36-48 hours please call the anesthesia department. ° °QUESTIONS?:  Please feel free to call your physician or the hospital operator if you have any questions, and they will be happy to assist you.    ° ° ° ° ° °Electrical Cardioversion, Care After °Refer to this sheet in the next few weeks. These instructions provide you with information on caring for yourself after your procedure. Your health care provider may also give you more specific instructions. Your treatment has been planned according to current medical practices, but problems sometimes occur. Call your health care provider if you have any problems or questions after your procedure. °WHAT TO EXPECT AFTER THE PROCEDURE °After your procedure, it is typical to have the following sensations: °· Some redness on the skin where the shocks were delivered. If this is tender, a sunburn lotion or hydrocortisone cream may help. °· Possible return of an abnormal heart rhythm within hours or days after the procedure. °HOME CARE INSTRUCTIONS °· Take medicines only as directed by your health care  provider. Be sure you understand how and when to take your medicine. °· Learn how to feel your pulse and check it often. °· Limit your activity for 48 hours after the procedure or as directed by your health care provider. °· Avoid or minimize caffeine and other stimulants as directed by your health care provider. °SEEK MEDICAL CARE IF: °· You feel like your heart is beating too fast or your pulse is not regular. °· You have any questions about your medicines. °· You have bleeding that will not stop. °SEEK IMMEDIATE MEDICAL CARE IF: °· You are dizzy or feel faint. °· It is hard to breathe or you feel short of breath. °· There is a change in discomfort in your chest. °· Your speech is slurred or you have trouble moving an arm or leg on one side of your body. °· You get a serious muscle cramp that does not go away. °· Your fingers or toes turn cold or blue. °  °This information is not intended to replace advice given to you by your health care provider. Make sure you discuss any questions you have with your health care provider. °  °Document Released: 12/21/2012 Document Revised: 03/23/2014 Document Reviewed: 12/21/2012 °Elsevier Interactive Patient Education ©2016 [employer] ° ° °

## 2015-08-30 NOTE — Transfer of Care (Signed)
Immediate Anesthesia Transfer of Care Note  Patient: [name] Fry  Procedure(s) Performed: Procedure(s): CARDIOVERSION (N/A)  Patient Location: PACU  Anesthesia Type:MAC  Level of Consciousness: awake and alert   Airway & Oxygen Therapy: Patient Spontanous Breathing and Patient connected to nasal cannula oxygen  Post-op Assessment: Report given to RN  Post vital signs: Reviewed and stable  Last Vitals:  Filed Vitals:   08/30/15 0945 08/30/15 1015  BP:  94/56  Pulse:  50  Temp:    Resp: 23 19    Last Pain: There were no vitals filed for this visit.    Patients Stated Pain Goal: 4 ([id number])  Complications: No apparent anesthesia complications

## 2015-09-03 ENCOUNTER — Encounter (HOSPITAL_COMMUNITY): Payer: Self-pay | Admitting: Cardiology

## 2015-09-16 ENCOUNTER — Ambulatory Visit (INDEPENDENT_AMBULATORY_CARE_PROVIDER_SITE_OTHER): Payer: Medicare Other | Admitting: Cardiology

## 2015-09-16 ENCOUNTER — Encounter: Payer: Self-pay | Admitting: Cardiology

## 2015-09-16 ENCOUNTER — Telehealth: Payer: Self-pay | Admitting: *Deleted

## 2015-09-16 VITALS — BP 154/102 | HR 113 | Ht 63.0 in | Wt 199.0 lb

## 2015-09-16 DIAGNOSIS — I2510 Atherosclerotic heart disease of native coronary artery without angina pectoris: Secondary | ICD-10-CM

## 2015-09-16 DIAGNOSIS — I429 Cardiomyopathy, unspecified: Secondary | ICD-10-CM | POA: Diagnosis not present

## 2015-09-16 DIAGNOSIS — I481 Persistent atrial fibrillation: Secondary | ICD-10-CM | POA: Diagnosis not present

## 2015-09-16 DIAGNOSIS — Z789 Other specified health status: Secondary | ICD-10-CM

## 2015-09-16 DIAGNOSIS — I10 Essential (primary) hypertension: Secondary | ICD-10-CM | POA: Diagnosis not present

## 2015-09-16 DIAGNOSIS — Z889 Allergy status to unspecified drugs, medicaments and biological substances status: Secondary | ICD-10-CM

## 2015-09-16 DIAGNOSIS — I4819 Other persistent atrial fibrillation: Secondary | ICD-10-CM

## 2015-09-16 DIAGNOSIS — E782 Mixed hyperlipidemia: Secondary | ICD-10-CM

## 2015-09-16 MED ORDER — METOPROLOL SUCCINATE ER 50 MG PO TB24: ORAL_TABLET | ORAL | Status: DC

## 2015-09-16 NOTE — Patient Instructions (Signed)
Your physician recommends that you schedule a follow-up appointment in: [place]  Your physician has recommended you make the following change in your medication:   INCREASE TOPROL XL 50 MG IN THE MORNING AND 25 MG IN THE EVENING   STOP AMIODARONE  You have been referred to ELECTROPHYSIOLOGY  Thank you for choosing [facility]!!

## 2015-09-16 NOTE — Telephone Encounter (Signed)
Pt says she has had diarrhea for last 6 weeks, and wanted to know if metoprolol or xarelto could be causing this and if something she could take other than otc meds. Will forward with understanding that office will be closed tomorrow will return call Wednesday

## 2015-09-16 NOTE — Progress Notes (Signed)
Cardiology Office Note  Date: 09/16/2015   ID: [name] Fry, DOB 07-13-1945, MRN [id number]  PCP: [name], MD  Primary Cardiologist: Rozann Lesches, MD   Chief Complaint  Patient presents with  . PAF  . Follow-up cardioversion    History of Present Illness: [name] Fry is a 70 y.o. female seen last month with recurrent symptomatic atrial fibrillation/flutter. She was continued on amiodarone load at that time as well as anticoagulation with Xarelto and referred for an elective cardioversion. Procedure was performed on June 16 with successful restoration of sinus rhythm. She was discharged on amiodarone 200 mg daily and Toprol-XL at 50 mg in the evening.  She comes in today with one of her daughters for a follow-up. It sounds like she possibly went back into atrial fibrillation/flutter within a week of her cardioversion based on discussion today. She reports compliance with her medications. Mainly complains of a feeling of fatigue in her legs, not necessarily palpitations or chest pain.  ECG today shows atrial flutter with variable conduction and left bundle branch block.  Today we discussed EP referral for consultation regarding further management of her atrial arrhythmia. Does not look like amiodarone is going to be a great rhythm control option for her long-term given rapid recurrence after cardioversion with adequate load.  Past Medical History  Diagnosis Date  . Hypothyroidism   . Hyperlipidemia   . GERD (gastroesophageal reflux disease)   . Coronary atherosclerosis of native coronary artery     DES OM 3/12  . Essential hypertension, benign   . H/O hiatal hernia   . Arthritis   . Cataracts, bilateral   . Breast cancer ([place])     Left  . Atrial fibrillation [facility])     Documented January 2017    Past Surgical History  Procedure Laterality Date  . Mastectomy Left     1997; S/P chemotherapy/radiation  . Cholecystectomy    . Appendectomy    . Tubal ligation    . 25  gauge pars plana vitrectomy with 20 gauge mvr port Right 09/29/2012    Procedure: 25 GAUGE PARS PLANA VITRECTOMY WITH 20 GAUGE MVR PORT;  Surgeon: Hayden Pedro, MD;  Location: [place];  Service: Ophthalmology;  Laterality: Right;  . Laser photo ablation Right 09/29/2012    Procedure: LASER PHOTO ABLATION;  Surgeon: Hayden Pedro, MD;  Location: [place];  Service: Ophthalmology;  Laterality: Right;  Headscope laser  . Membrane peel Right 09/29/2012    Procedure: MEMBRANE PEEL;  Surgeon: Hayden Pedro, MD;  Location: [place];  Service: Ophthalmology;  Laterality: Right;  . Gas/fluid exchange Right 09/29/2012    Procedure: GAS/FLUID EXCHANGE;  Surgeon: Hayden Pedro, MD;  Location: [place];  Service: Ophthalmology;  Laterality: Right;  . Cataract extraction w/phaco Right 05/18/2013    Procedure: CATARACT EXTRACTION PHACO AND INTRAOCULAR LENS PLACEMENT RIGHT EYE;  Surgeon: Tonny Branch, MD;  [facility];  Service: Ophthalmology;  Laterality: Right;  CDE:13.35  . Cardiac catheterization N/A 07/26/2015    Procedure: Left Heart Cath and Coronary Angiography;  Surgeon: [name], MD;  Location: [place] CV LAB;  Service: Cardiovascular;  Laterality: N/A;  . Cardioversion N/A 08/30/2015    Procedure: CARDIOVERSION;  Surgeon: Satira Sark, MD;  [facility];  Service: Cardiovascular;  Laterality: N/A;    Current Outpatient Prescriptions  Medication Sig Dispense Refill  . furosemide (LASIX) 40 MG tablet Take 1 tablet (40 mg total) by mouth daily  as needed for fluid (or weight gain). 30 tablet 2  . levothyroxine (SYNTHROID, LEVOTHROID) 112 MCG tablet Take 112 mcg by mouth daily.     . Multiple Vitamins-Minerals (OCUVITE PRESERVISION PO) Take 2 tablets by mouth daily.    . nitroGLYCERIN (NITROSTAT) 0.4 MG SL tablet Place 1 tablet (0.4 mg total) under the tongue every 5 (five) minutes x 3 doses as needed for chest pain. 25 tablet 3  . rivaroxaban (XARELTO) 20 MG TABS tablet Take 1 tablet  (20 mg total) by mouth daily with supper. 28 tablet 0  . Vitamin D, Cholecalciferol, 1000 units CAPS Take 1 capsule by mouth 2 (two) times daily.    . metoprolol succinate (TOPROL-XL) 50 MG 24 hr tablet Take 50 mg in the AM and 25 mg PM 135 tablet 3   No current facility-administered medications for this visit.   Allergies:  Doxycycline   Social History: The patient  reports that she has never smoked. She has never used smokeless tobacco. She reports that she does not drink alcohol or use illicit drugs.   ROS:  Please see the history of present illness. Otherwise, complete review of systems is positive for fatigue.  All other systems are reviewed and negative.   Physical Exam: VS:  BP 154/102 mmHg  Pulse 113  Ht 5\' 3"  (1.6 m)  Wt 199 lb (90.266 kg)  BMI 35.26 kg/m2  SpO2 92%  LMP 09/28/2012, BMI Body mass index is 35.26 kg/(m^2).  Wt Readings from Last 3 Encounters:  09/16/15 199 lb (90.266 kg)  08/30/15 199 lb (90.266 kg)  08/26/15 199 lb (90.266 kg)    Overweight woman, no distress.Marland Kitchen  HEENT: Conjunctiva and lids normal, oropharynx with moist mucosa.  Neck: Supple, no elevated JVP or bruits.  Lungs: Clear to auscultation, nonlabored.  Cardiac: Irregularly irregular, no S3.  Abdomen: Soft, nontender, bowel sounds present.  Skin: Warm and dry.  Extremities: No pitting edema. Musculoskeletal: No kyphosis. Neuropsychiatric: Alert and oriented 3, affect appropriate.  ECG: I personally reviewed the tracing from 08/19/2015 which showed atrial flutter with 2:1 block and left bundle branch block.  Recent Labwork: 08/08/2015: TSH 3.876 08/13/2015: Magnesium 1.8 08/26/2015: ALT 20; AST 21; BUN 16; Creatinine, Ser 0.97; Hemoglobin 10.9*; Platelets 278; Potassium 3.7; Sodium 139   Other Studies Reviewed Today:  Echocardiogram 08/11/2015: Study Conclusions  - Left ventricle: The cavity size was normal. Wall thickness was  increased in a pattern of moderate LVH. Systolic  function was  moderately reduced. The estimated ejection fraction was in the  range of 35% to 40%. Global hypokinesis with inferior akinesis,  incoordinate septal motion. The study is not technically  sufficient to allow evaluation of LV diastolic function. Ejection  fraction (MOD, 2-plane): 40%. - Aortic valve: Trileaflet. Sclerosis without stenosis. There was  trivial regurgitation. - Mitral valve: Calcified annulus. Mildly thickened leaflets .  There was moderate regurgitation. - Left atrium: The atrium was normal in size. - Right ventricle: The cavity size was normal. - Right atrium: The atrium was mildly dilated. - Tricuspid valve: There was moderate regurgitation. - Pulmonary arteries: PA peak pressure: 30 mm Hg (S) + RAP. - Systemic veins: Not visualized. - Pericardium, extracardiac: A trivial pericardial effusion was  identified posterior to the heart. There was a left pleural  effusion.  Impressions:  - LVEF 35-40%, moderate LVH, global hypokinesis with inferior  akinesis and incoordinate septal motion, aortic valve sclerosis  with trivial AI, mitral annular calcification with moderate MR,  moderate TR,  RVSP 30 mmHG + right atrial pressure, IVC not  visualized, trivial posterior pericardial effusion and left  pleural effusion.  Cardiac catheterization 07/26/2015:  Prox LAD lesion, 30% stenosed.  The left ventricular systolic function is normal.  1. Single-vessel coronary artery disease with wide patency of the stented segment in the left circumflex 2. Mild coronary irregularity and calcification with no evidence of obstruction in the left main, LAD, or right coronary artery 3. Normal LV systolic function  Assessment and Plan:  1. Recurrent symptomatic atrial fibrillation/flutter. Plan at this time is to refer her for EP consultation regarding additional treatment options. She had fairly quick recurrence after successful electrical ardioversion with an  adequate proceeding amiodarone load. For now I will have her stop amiodarone, increase Toprol-XL to 50 mg the morning and 25 mg in the evening. Weight loss and potentially a sleep study to assess for OSA would also be recommended. We did discuss the possibility of considering strategy of heart rate control and anticoagulation, although would consider other treatment options first.  2. Cardiomyopathy out of proportion to CAD, possibly tachycardia-mediated. This is another reason to consider rhythm control strategy first.  3. CAD status post DES to the obtuse marginal in 2012, patent at recent cardiac catheterization with otherwise no significant obstructive disease in the major epicardials.  4  Hyperlipidemia with statin intolerance.  5. Essential hypertension, blood pressure significant elevated today. Toprol-XL being increased. Needs to keep follow-up Dr. Nadara Mustard, may need an additional agent and also sleep testing as already recommended.  Current medicines were reviewed with the patient today.   Orders Placed This Encounter  Procedures  . Ambulatory referral to Cardiac Electrophysiology  . EKG 12-Lead    Disposition: Follow-up with me after EP consultation.  Signed, Satira Sark, MD, [facility] 09/16/2015 5:07 PM    [facility] [place], [place], [place] [address] Phone: [phone]; Fax: [phone]

## 2015-09-16 NOTE — Telephone Encounter (Signed)
Would not be typical for Xarelto. Possible with Toprol XL (not common), but she has been on that medication for years without this problem before. May want to check in with PCP to make sure nothing else is going on.

## 2015-09-18 NOTE — Telephone Encounter (Signed)
Pt aware and will consult with Dr. Nadara Mustard.

## 2015-09-25 ENCOUNTER — Ambulatory Visit (INDEPENDENT_AMBULATORY_CARE_PROVIDER_SITE_OTHER): Payer: Medicare Other | Admitting: Internal Medicine

## 2015-09-25 ENCOUNTER — Encounter: Payer: Self-pay | Admitting: Internal Medicine

## 2015-09-25 VITALS — BP 148/100 | HR 111 | Ht 63.0 in | Wt 198.2 lb

## 2015-09-25 DIAGNOSIS — I481 Persistent atrial fibrillation: Secondary | ICD-10-CM

## 2015-09-25 DIAGNOSIS — R5382 Chronic fatigue, unspecified: Secondary | ICD-10-CM | POA: Diagnosis not present

## 2015-09-25 DIAGNOSIS — I429 Cardiomyopathy, unspecified: Secondary | ICD-10-CM | POA: Diagnosis not present

## 2015-09-25 DIAGNOSIS — I4819 Other persistent atrial fibrillation: Secondary | ICD-10-CM

## 2015-09-25 DIAGNOSIS — I428 Other cardiomyopathies: Secondary | ICD-10-CM

## 2015-09-25 MED ORDER — METOPROLOL SUCCINATE ER 50 MG PO TB24: 50.0000 mg | ORAL_TABLET | Freq: Two times a day (BID) | ORAL | Status: DC

## 2015-09-25 NOTE — Patient Instructions (Signed)
Medication Instructions:  Your physician has recommended you make the following change in your medication:  1) Increase Toprol to 50 mg twice daily   Labwork: None ordered   Testing/Procedures:   Follow-Up: Your physician recommends that you schedule a follow-up appointment as needed   Any Other Special Instructions Will Be Listed Below (If Applicable).  Call if you decide you want to get a sleep study and also the afib ablation with TEE prior  Janan Halter, RN     If you need a refill on your cardiac medications before your next appointment, please call your pharmacy.

## 2015-09-27 ENCOUNTER — Other Ambulatory Visit: Payer: Self-pay | Admitting: Cardiology

## 2015-09-27 DIAGNOSIS — R5383 Other fatigue: Secondary | ICD-10-CM | POA: Insufficient documentation

## 2015-09-27 DIAGNOSIS — I428 Other cardiomyopathies: Secondary | ICD-10-CM | POA: Insufficient documentation

## 2015-09-27 NOTE — Progress Notes (Signed)
Electrophysiology Office Note   Date:  09/27/2015   ID:  [name] Fry, DOB 06-06-1945, MRN [id number]  PCP:  [name] Percy, MD  Cardiologist:  Dr Domenic Polite Primary Electrophysiologist: Thompson Grayer, MD    Chief Complaint  Patient presents with  . Atrial Fibrillation     History of Present Illness: [name] Fry is a 70 y.o. female who presents today for electrophysiology evaluation.   She has persistent atrial fibrillation with elevated V rates.  She has recently failed cardioversion with amiodarone.  She has symptoms of CHF and EF is 35-40%.  She does not have CAD by cath 5/17 and is felt to possibly have a tachycardia mediated CM.  Her primary symptoms are fatigue.  Her legs are weak.  She does not feel well resting upon waking in the am.  Today, she denies symptoms of palpitations, chest pain, orthopnea, PND, lower extremity edema, claudication, dizziness, presyncope, syncope, bleeding, or neurologic sequela. The patient is tolerating medications without difficulties and is otherwise without complaint today.    Past Medical History  Diagnosis Date  . Hypothyroidism   . Hyperlipidemia   . GERD (gastroesophageal reflux disease)   . Coronary atherosclerosis of native coronary artery     DES OM 3/12  . Essential hypertension, benign   . H/O hiatal hernia   . Arthritis   . Cataracts, bilateral   . Breast cancer ([place])     Left  . Atrial fibrillation [facility])     Documented January 2017   Past Surgical History  Procedure Laterality Date  . Mastectomy Left     1997; S/P chemotherapy/radiation  . Cholecystectomy    . Appendectomy    . Tubal ligation    . 25 gauge pars plana vitrectomy with 20 gauge mvr port Right 09/29/2012    Procedure: 25 GAUGE PARS PLANA VITRECTOMY WITH 20 GAUGE MVR PORT;  Surgeon: Hayden Pedro, MD;  Location: [place];  Service: Ophthalmology;  Laterality: Right;  . Laser photo ablation Right 09/29/2012    Procedure: LASER PHOTO ABLATION;  Surgeon: Hayden Pedro, MD;  Location: [place];  Service: Ophthalmology;  Laterality: Right;  Headscope laser  . Membrane peel Right 09/29/2012    Procedure: MEMBRANE PEEL;  Surgeon: Hayden Pedro, MD;  Location: [place];  Service: Ophthalmology;  Laterality: Right;  . Gas/fluid exchange Right 09/29/2012    Procedure: GAS/FLUID EXCHANGE;  Surgeon: Hayden Pedro, MD;  Location: [place];  Service: Ophthalmology;  Laterality: Right;  . Cataract extraction w/phaco Right 05/18/2013    Procedure: CATARACT EXTRACTION PHACO AND INTRAOCULAR LENS PLACEMENT RIGHT EYE;  Surgeon: Tonny Branch, MD;  [facility];  Service: Ophthalmology;  Laterality: Right;  CDE:13.35  . Cardiac catheterization N/A 07/26/2015    Procedure: Left Heart Cath and Coronary Angiography;  Surgeon: [name], MD;  Location: [place] CV LAB;  Service: Cardiovascular;  Laterality: N/A;  . Cardioversion N/A 08/30/2015    Procedure: CARDIOVERSION;  Surgeon: Satira Sark, MD;  [facility];  Service: Cardiovascular;  Laterality: N/A;     Current Outpatient Prescriptions  Medication Sig Dispense Refill  . furosemide (LASIX) 40 MG tablet Take 1 tablet (40 mg total) by mouth daily as needed for fluid (or weight gain). 30 tablet 2  . levothyroxine (SYNTHROID, LEVOTHROID) 112 MCG tablet Take 112 mcg by mouth daily.     . metoprolol succinate (TOPROL-XL) 50 MG 24 hr tablet Take 1 tablet (50 mg total) by mouth 2 (  two) times daily. 180 tablet 3  . Multiple Vitamins-Minerals (OCUVITE PRESERVISION PO) Take 2 tablets by mouth daily.    . nitroGLYCERIN (NITROSTAT) 0.4 MG SL tablet Place 1 tablet (0.4 mg total) under the tongue every 5 (five) minutes x 3 doses as needed for chest pain. 25 tablet 3  . rivaroxaban (XARELTO) 20 MG TABS tablet Take 1 tablet (20 mg total) by mouth daily with supper. 28 tablet 0  . Vitamin D, Cholecalciferol, 1000 units CAPS Take 1 capsule by mouth 2 (two) times daily.     No current facility-administered medications  for this visit.    Allergies:   Doxycycline   Social History:  The patient  reports that she has never smoked. She has never used smokeless tobacco. She reports that she does not drink alcohol or use illicit drugs.   Family History:  The patient's  family history includes Heart attack (age of onset: 35) in her father.    ROS:  Please see the history of present illness.   All other systems are reviewed and negative.    PHYSICAL EXAM: VS:  BP 148/100 mmHg  Pulse 111  Ht 5\' 3"  (1.6 m)  Wt 198 lb 3.2 oz (89.903 kg)  BMI 35.12 kg/m2  LMP 09/28/2012 , BMI Body mass index is 35.12 kg/(m^2). GEN: Well nourished, well developed, in no acute distress HEENT: normal Neck: no JVD, carotid bruits, or masses Cardiac: tachycardic irregular rhythm Respiratory:  clear to auscultation bilaterally, normal work of breathing GI: soft, nontender, nondistended, + BS MS: no deformity or atrophy Skin: warm and dry  Neuro:  Strength and sensation are intact Psych: euthymic mood, full affect  EKG:  EKG is ordered today. The ekg ordered today shows afib with V rate 110s, LBBB (QRS 130 msec), QTc 511 msec   Recent Labs: 08/08/2015: TSH 3.876 08/13/2015: Magnesium 1.8 08/26/2015: ALT 20; BUN 16; Creatinine, Ser 0.97; Hemoglobin 10.9*; Platelets 278; Potassium 3.7; Sodium 139    Lipid Panel  No results found for: CHOL, TRIG, HDL, CHOLHDL, VLDL, LDLCALC, LDLDIRECT   Wt Readings from Last 3 Encounters:  09/25/15 198 lb 3.2 oz (89.903 kg)  09/16/15 199 lb (90.266 kg)  08/30/15 199 lb (90.266 kg)      Other studies Reviewed: Additional studies/ records that were reviewed today include: Dr McDowell's notes, prior echo and cath  Review of the above records today demonstrates: as above   ASSESSMENT AND PLAN:  1.  Persistent atrial fibrillation The patient has symptomatic atrial arrhythmias.  She has failed medical therapy with amiodarone.  She likely has a tachycardia mediated CM.    Therapeutic  strategies for afib including medicine and ablation were discussed in detail with the patient today. Risk, benefits, and alternatives to EP study and radiofrequency ablation for afib were also discussed in detail today.  At this time, she is clear that she wishes to avoid ablation, though her daughter states that she will discuss this with her further.  Currently, I think that our only other option would be rate control.  I will therefore increase toprol to 50mg  BID at this time. Should she decide to consider ablation, she would require TEE prior to the procedure to evaluate LAA for thrombus and also to better characterize her MR.  2. Nonischemic CM Increase rate control as above I would like to add an ACE inhibitor however she is very overwhelmed by the recommendations that I have made today and will need to have this added at a  later time.  3. Fatigue I am concerned about OSA I have recommended sleep study. She is not ready to proceed with this currently.  Follow-up with Dr Domenic Polite for rate control and further toprol titration Should she decide to consider ablation in the near future, I would be happy to proceed.  Current medicines are reviewed at length with the patient today.   The patient does not have concerns regarding her medicines.  The following changes were made today:  none  Labs/ tests ordered today include:  Orders Placed This Encounter  Procedures  . EKG 12-Lead     Signed, Thompson Grayer, MD  09/27/2015 12:40 PM     [address] [place] [address] [phone] (office) [phone] (fax)

## 2015-10-07 DIAGNOSIS — H35033 Hypertensive retinopathy, bilateral: Secondary | ICD-10-CM | POA: Diagnosis not present

## 2015-10-07 DIAGNOSIS — H524 Presbyopia: Secondary | ICD-10-CM | POA: Diagnosis not present

## 2015-10-22 ENCOUNTER — Telehealth: Payer: Self-pay | Admitting: Internal Medicine

## 2015-10-22 DIAGNOSIS — I480 Paroxysmal atrial fibrillation: Secondary | ICD-10-CM

## 2015-10-22 NOTE — Telephone Encounter (Signed)
Pt and daughter calling to speak with [name] Fry, to schedule a date for the pt to have her afib ablation done.  Per the daughter, the pt saw Dr [name] Fry on 7/12, and was instructed to call the office back when she decided to proceed with scheduling her afib ablation, and other test needed.  Informed the pt that Dr [name] Fry and [name] Fry are both out of the office this week, but I will route this message to [facility] to review and follow-up with the daughter (as pt requested to schedule this through her daughter [name] Fry), upon return to the office next week.  Both parties verbalized understanding and agrees with this plan.  Both gracious for all the assistance provided.

## 2015-10-22 NOTE — Telephone Encounter (Signed)
New Message  Pt daughter call requesting to speak with RN about scheduling surgical procedure for pt. Please call back to discuss

## 2015-10-23 ENCOUNTER — Telehealth: Payer: Self-pay | Admitting: Cardiology

## 2015-10-23 NOTE — Telephone Encounter (Signed)
Notified daughter Alexcia Bromm).  Stated that she just had decided to have the ablation & has contacted Dr. [name] office.  Was told may be next week before they get this scheduled as his nurse is out of the office this week.  Patient does has OV this Wednesday with Dr. Domenic Polite in [place] office.  Do not think we could get her in to see Roderic Palau, NP (AF clinic) prior to this appointment.  Daughter says mother is questioning going back on the Amiodarone, thinks she felt better on that medication.  Explained to her that if the med was not working for her, not beneficial to remain on it.  Mother also c/o of feeling like she is not getting enough oxygen.  Explained that all of these symptoms are probably related to the AF not being under control.  Daughter stated that she does have little monitor to put on her finger at home.  Advised to check percentage & let us know if below 90's range.  Informed daughter that SM is out of the office, but would send him a message for him to answer upon his return on Monday.  Also, strongly encouraged to go to ED in the meantime if symptoms worsen.  Daughter verbalized understanding.

## 2015-10-23 NOTE — Telephone Encounter (Signed)
Patient's daughter called stating that patient is very fatigue for the past several weeks. States that she has not rested in the past 2 nights.  States that she is in Atrial Fib with shortness of breath.  [place] daughter.

## 2015-10-23 NOTE — Telephone Encounter (Signed)
See if you can get her into see Roderic Palau NP sooner.

## 2015-10-23 NOTE — Telephone Encounter (Signed)
This is ongoing issue for patient.  She has been referred by Dr. Domenic Polite to Dr. [name].

## 2015-10-24 NOTE — Telephone Encounter (Signed)
Zo Maret (daughter) notified.

## 2015-10-24 NOTE — Telephone Encounter (Signed)
Noted. Looks like she already has a visit scheduled with me at which point we can review her rate control regimen. Would not resume Amiodarone now since we know this was not effective before for rhythm control. Key issue here is getting her back to Dr. [name] or AF clinic since AF ablation has already been discussed.

## 2015-10-28 NOTE — Telephone Encounter (Signed)
Follow up      Calling to schedule ablation procedure.  Please call and let her know when [name] Fry will be back if no one else can schedule this procedure

## 2015-10-28 NOTE — Telephone Encounter (Signed)
Called patient and daughter to let them know Claiborne Billings will be back in the office tomorrow.

## 2015-10-29 ENCOUNTER — Encounter (HOSPITAL_COMMUNITY): Payer: Self-pay | Admitting: Emergency Medicine

## 2015-10-29 ENCOUNTER — Emergency Department (HOSPITAL_COMMUNITY): Payer: Medicare Other

## 2015-10-29 ENCOUNTER — Inpatient Hospital Stay (HOSPITAL_COMMUNITY)
Admission: EM | Admit: 2015-10-29 | Discharge: 2015-10-31 | DRG: 308 | Disposition: A | Discharge status: Home / Self Care | Payer: Medicare Other | Attending: Internal Medicine | Admitting: Internal Medicine

## 2015-10-29 DIAGNOSIS — I4891 Unspecified atrial fibrillation: Secondary | ICD-10-CM | POA: Diagnosis not present

## 2015-10-29 DIAGNOSIS — I110 Hypertensive heart disease with heart failure: Secondary | ICD-10-CM | POA: Diagnosis present

## 2015-10-29 DIAGNOSIS — I428 Other cardiomyopathies: Secondary | ICD-10-CM

## 2015-10-29 DIAGNOSIS — Z923 Personal history of irradiation: Secondary | ICD-10-CM | POA: Diagnosis not present

## 2015-10-29 DIAGNOSIS — I429 Cardiomyopathy, unspecified: Secondary | ICD-10-CM | POA: Diagnosis not present

## 2015-10-29 DIAGNOSIS — E039 Hypothyroidism, unspecified: Secondary | ICD-10-CM | POA: Diagnosis present

## 2015-10-29 DIAGNOSIS — R079 Chest pain, unspecified: Secondary | ICD-10-CM | POA: Diagnosis not present

## 2015-10-29 DIAGNOSIS — Z7901 Long term (current) use of anticoagulants: Secondary | ICD-10-CM

## 2015-10-29 DIAGNOSIS — R0602 Shortness of breath: Secondary | ICD-10-CM | POA: Diagnosis not present

## 2015-10-29 DIAGNOSIS — Z8249 Family history of ischemic heart disease and other diseases of the circulatory system: Secondary | ICD-10-CM

## 2015-10-29 DIAGNOSIS — E785 Hyperlipidemia, unspecified: Secondary | ICD-10-CM | POA: Diagnosis not present

## 2015-10-29 DIAGNOSIS — G4700 Insomnia, unspecified: Secondary | ICD-10-CM | POA: Diagnosis not present

## 2015-10-29 DIAGNOSIS — Z9221 Personal history of antineoplastic chemotherapy: Secondary | ICD-10-CM | POA: Diagnosis not present

## 2015-10-29 DIAGNOSIS — Z955 Presence of coronary angioplasty implant and graft: Secondary | ICD-10-CM | POA: Diagnosis not present

## 2015-10-29 DIAGNOSIS — I480 Paroxysmal atrial fibrillation: Secondary | ICD-10-CM | POA: Diagnosis not present

## 2015-10-29 DIAGNOSIS — Z853 Personal history of malignant neoplasm of breast: Secondary | ICD-10-CM | POA: Diagnosis not present

## 2015-10-29 DIAGNOSIS — I5023 Acute on chronic systolic (congestive) heart failure: Secondary | ICD-10-CM | POA: Diagnosis present

## 2015-10-29 DIAGNOSIS — K219 Gastro-esophageal reflux disease without esophagitis: Secondary | ICD-10-CM | POA: Diagnosis present

## 2015-10-29 DIAGNOSIS — I2510 Atherosclerotic heart disease of native coronary artery without angina pectoris: Secondary | ICD-10-CM | POA: Diagnosis not present

## 2015-10-29 DIAGNOSIS — I509 Heart failure, unspecified: Secondary | ICD-10-CM | POA: Diagnosis not present

## 2015-10-29 LAB — BRAIN NATRIURETIC PEPTIDE: B NATRIURETIC PEPTIDE 5: 1555 pg/mL — AB (ref 0.0–100.0)

## 2015-10-29 LAB — URINE MICROSCOPIC-ADD ON
Bacteria, UA: NONE SEEN
RBC / HPF: NONE SEEN RBC/hpf (ref 0–5)
WBC UA: NONE SEEN WBC/hpf (ref 0–5)

## 2015-10-29 LAB — URINALYSIS, ROUTINE W REFLEX MICROSCOPIC
Bilirubin Urine: NEGATIVE
GLUCOSE, UA: NEGATIVE mg/dL
Hgb urine dipstick: NEGATIVE
Ketones, ur: NEGATIVE mg/dL
LEUKOCYTES UA: NEGATIVE
Nitrite: NEGATIVE
SPECIFIC GRAVITY, URINE: 1.01 (ref 1.005–1.030)
pH: 6 (ref 5.0–8.0)

## 2015-10-29 LAB — CBC WITH DIFFERENTIAL/PLATELET
BASOS ABS: 0.1 10*3/uL (ref 0.0–0.1)
Basophils Relative: 1 %
EOS PCT: 1 %
Eosinophils Absolute: 0 10*3/uL (ref 0.0–0.7)
HEMATOCRIT: 40.9 % (ref 36.0–46.0)
HEMOGLOBIN: 13.4 g/dL (ref 12.0–15.0)
LYMPHS ABS: 2 10*3/uL (ref 0.7–4.0)
LYMPHS PCT: 25 %
MCH: 29.3 pg (ref 26.0–34.0)
MCHC: 32.8 g/dL (ref 30.0–36.0)
MCV: 89.5 fL (ref 78.0–100.0)
MONO ABS: 0.6 10*3/uL (ref 0.1–1.0)
MONOS PCT: 7 %
Neutro Abs: 5.6 10*3/uL (ref 1.7–7.7)
Neutrophils Relative %: 66 %
Platelets: 284 10*3/uL (ref 150–400)
RBC: 4.57 MIL/uL (ref 3.87–5.11)
RDW: 14.6 % (ref 11.5–15.5)
WBC: 8.3 10*3/uL (ref 4.0–10.5)

## 2015-10-29 LAB — COMPREHENSIVE METABOLIC PANEL
ALBUMIN: 4 g/dL (ref 3.5–5.0)
ALT: 22 U/L (ref 14–54)
AST: 22 U/L (ref 15–41)
Alkaline Phosphatase: 74 U/L (ref 38–126)
Anion gap: 7 (ref 5–15)
BILIRUBIN TOTAL: 1 mg/dL (ref 0.3–1.2)
BUN: 27 mg/dL — AB (ref 6–20)
CO2: 25 mmol/L (ref 22–32)
CREATININE: 0.86 mg/dL (ref 0.44–1.00)
Calcium: 9.1 mg/dL (ref 8.9–10.3)
Chloride: 107 mmol/L (ref 101–111)
GFR calc Af Amer: >60 mL/min (ref >60)
GLUCOSE: 121 mg/dL — AB (ref 65–99)
POTASSIUM: 3.8 mmol/L (ref 3.5–5.1)
Sodium: 139 mmol/L (ref 135–145)
TOTAL PROTEIN: 6.9 g/dL (ref 6.5–8.1)

## 2015-10-29 LAB — PROTIME-INR
INR: 1.71
Prothrombin Time: 20.3 seconds — ABNORMAL HIGH (ref 11.4–15.2)

## 2015-10-29 LAB — TROPONIN I: Troponin I: <0.03 ng/mL (ref <0.03)

## 2015-10-29 LAB — LIPASE, BLOOD: Lipase: 20 U/L (ref 11–51)

## 2015-10-29 LAB — TSH: TSH: 5.133 u[IU]/mL — AB (ref 0.350–4.500)

## 2015-10-29 LAB — MRSA PCR SCREENING: MRSA by PCR: NEGATIVE

## 2015-10-29 MED ORDER — FUROSEMIDE 10 MG/ML IJ SOLN
40.0000 mg | Freq: Once | INTRAMUSCULAR | Status: AC
  Administered 2015-10-29: 40 mg via INTRAVENOUS
  Filled 2015-10-29: qty 4

## 2015-10-29 MED ORDER — DIGOXIN 0.25 MG/ML IJ SOLN
0.3750 mg | Freq: Once | INTRAMUSCULAR | Status: AC
  Administered 2015-10-29: 0.375 mg via INTRAVENOUS
  Filled 2015-10-29: qty 2

## 2015-10-29 MED ORDER — SODIUM CHLORIDE 0.9 % IV SOLN
250.0000 mL | INTRAVENOUS | Status: DC | PRN
Start: 2015-10-29 — End: 2015-10-31

## 2015-10-29 MED ORDER — NITROGLYCERIN 0.4 MG SL SUBL: 0.4000 mg | SUBLINGUAL_TABLET | SUBLINGUAL | Status: DC | PRN

## 2015-10-29 MED ORDER — RIVAROXABAN 20 MG PO TABS
20.0000 mg | ORAL_TABLET | Freq: Every day | ORAL | Status: DC
  Administered 2015-10-29 – 2015-10-30 (×2): 20 mg via ORAL
  Filled 2015-10-29 (×2): qty 1

## 2015-10-29 MED ORDER — LEVOTHYROXINE SODIUM 112 MCG PO TABS
112.0000 ug | ORAL_TABLET | Freq: Every day | ORAL | Status: DC
  Administered 2015-10-29 – 2015-10-31 (×3): 112 ug via ORAL
  Filled 2015-10-29 (×3): qty 1

## 2015-10-29 MED ORDER — SODIUM CHLORIDE 0.9% FLUSH
3.0000 mL | Freq: Two times a day (BID) | INTRAVENOUS | Status: DC
  Administered 2015-10-29 – 2015-10-30 (×3): 3 mL via INTRAVENOUS

## 2015-10-29 MED ORDER — ASPIRIN EC 81 MG PO TBEC
81.0000 mg | DELAYED_RELEASE_TABLET | Freq: Every day | ORAL | Status: DC
  Administered 2015-10-30 – 2015-10-31 (×2): 81 mg via ORAL
  Filled 2015-10-29 (×3): qty 1

## 2015-10-29 MED ORDER — DEXTROSE 5 % IV SOLN
5.0000 mg/h | INTRAVENOUS | Status: DC
  Administered 2015-10-29 (×2): 10 mg/h via INTRAVENOUS
  Administered 2015-10-29: 5 mg/h via INTRAVENOUS
  Filled 2015-10-29: qty 100

## 2015-10-29 MED ORDER — ONDANSETRON HCL 4 MG/2ML IJ SOLN: 4.0000 mg | Freq: Four times a day (QID) | INTRAMUSCULAR | Status: DC | PRN

## 2015-10-29 MED ORDER — ACETAMINOPHEN 325 MG PO TABS
650.0000 mg | ORAL_TABLET | ORAL | Status: DC | PRN
  Administered 2015-10-29: 650 mg via ORAL
  Filled 2015-10-29: qty 2

## 2015-10-29 MED ORDER — METOPROLOL TARTRATE 25 MG PO TABS
25.0000 mg | ORAL_TABLET | Freq: Four times a day (QID) | ORAL | Status: DC
  Administered 2015-10-29 – 2015-10-31 (×7): 25 mg via ORAL
  Filled 2015-10-29 (×7): qty 1

## 2015-10-29 MED ORDER — DILTIAZEM LOAD VIA INFUSION
10.0000 mg | Freq: Once | INTRAVENOUS | Status: AC
  Administered 2015-10-29: 10 mg via INTRAVENOUS
  Filled 2015-10-29: qty 10

## 2015-10-29 MED ORDER — SODIUM CHLORIDE 0.9% FLUSH: 3.0000 mL | INTRAVENOUS | Status: DC | PRN

## 2015-10-29 MED ORDER — DIGOXIN 0.25 MG/ML IJ SOLN
0.2500 mg | Freq: Four times a day (QID) | INTRAMUSCULAR | Status: AC
  Administered 2015-10-29 – 2015-10-30 (×2): 0.25 mg via INTRAVENOUS
  Filled 2015-10-29 (×2): qty 2

## 2015-10-29 MED ORDER — FUROSEMIDE 10 MG/ML IJ SOLN
20.0000 mg | Freq: Two times a day (BID) | INTRAMUSCULAR | Status: DC
  Administered 2015-10-30 – 2015-10-31 (×3): 20 mg via INTRAVENOUS
  Filled 2015-10-29 (×4): qty 2

## 2015-10-29 NOTE — Consult Note (Signed)
Primary cardiologist: Dr Mikeal Hawthorne McDowell/Dr Thompson Grayer Consulting cardiologist: Dr Carlyle Dolly Requesting physician: Dr Ezequiel Essex Indication: acute on chronic systolic HF, afib RVR  Clinical Summary [name] Fry is a 70 y.o.female history of chronic systolic HF LVEF [id number], recurrent symptomatic afib/flutter with prior DCCV June 2016 while on amiodarone but had recurrence within 1 week, CAD with prior stenting with cath 07/2015 with nonobstructive CAD, admitted with SOB. From recent EP notes being considered for afib ablation. She presents with several day history of progression abdominal distension and pain. She has noted some increased SOB as well. No palpitations, no chest pain, no LE edema.    ER vitals: bp 135/102, p 112, 97% RA Echo 07/2015 LVEF 35-40%, moderate MR,  07/2015 prox LAD 30%, nonobstructive CAD WBC 8.3, Hgb 13.4, Plt 284, K 3.8, Cr 0.86, trop neg x1, BNP 1555, TSH 5.133,  ABG pending CXR +CHF EKG afib RVR, LBBB (chronic LBBB)   Allergies  Allergen Reactions  . Doxycycline Rash    Medications Scheduled Medications:     Infusions: . diltiazem (CARDIZEM) infusion 10 mg/hr (10/29/15 0911)     PRN Medications:     Past Medical History:  Diagnosis Date  . Arthritis   . Atrial fibrillation [facility])    Documented January 2017  . Breast cancer ([place])    Left  . Cataracts, bilateral   . Coronary atherosclerosis of native coronary artery    DES OM 3/12  . Essential hypertension, benign   . GERD (gastroesophageal reflux disease)   . H/O hiatal hernia   . Hyperlipidemia   . Hypothyroidism     Past Surgical History:  Procedure Laterality Date  . [place] VITRECTOMY WITH 20 GAUGE MVR PORT Right 09/29/2012   Procedure: 25 GAUGE PARS PLANA VITRECTOMY WITH 20 GAUGE MVR PORT;  Surgeon: Hayden Pedro, MD;  Location: [place];  Service: Ophthalmology;  Laterality: Right;  . APPENDECTOMY    . CARDIAC CATHETERIZATION N/A 07/26/2015   Procedure:  Left Heart Cath and Coronary Angiography;  Surgeon: [name], MD;  Location: [place] [facility];  Service: Cardiovascular;  Laterality: N/A;  . CARDIOVERSION N/A 08/30/2015   Procedure: CARDIOVERSION;  Surgeon: Satira Sark, MD;  [facility];  Service: Cardiovascular;  Laterality: N/A;  . CATARACT EXTRACTION W/PHACO Right 05/18/2013   Procedure: CATARACT EXTRACTION PHACO AND INTRAOCULAR LENS PLACEMENT RIGHT EYE;  Surgeon: Tonny Haven Pylant, MD;  [facility]: [facility];  Service: Ophthalmology;  Laterality: Right;  CDE:13.35  . CHOLECYSTECTOMY    . GAS/FLUID EXCHANGE Right 09/29/2012   Procedure: GAS/FLUID EXCHANGE;  Surgeon: Hayden Pedro, MD;  Location: [place];  Service: Ophthalmology;  Laterality: Right;  . LASER PHOTO ABLATION Right 09/29/2012   Procedure: LASER PHOTO ABLATION;  Surgeon: Hayden Pedro, MD;  Location: [place];  Service: Ophthalmology;  Laterality: Right;  Headscope laser  . MASTECTOMY Left    1997; S/P chemotherapy/radiation  . MEMBRANE PEEL Right 09/29/2012   Procedure: MEMBRANE PEEL;  Surgeon: Hayden Pedro, MD;  Location: [place];  Service: Ophthalmology;  Laterality: Right;  . TUBAL LIGATION      Family History  Problem Relation Age of Onset  . Heart attack Father 55    Social History Ms. Vandermeulen reports that she has never smoked. She has never used smokeless tobacco. Ms. Gitchell reports that she does not drink alcohol.  Review of Systems CONSTITUTIONAL: No weight loss, fever, chills, weakness or fatigue.  HEENT: Eyes: No visual  loss, blurred vision, double vision or yellow sclerae. No hearing loss, sneezing, congestion, runny nose or sore throat.  SKIN: No rash or itching.  CARDIOVASCULAR: No chest pain, chest pressure or chest discomfort. No palpitations or edema.  RESPIRATORY: +SOB GASTROINTESTINAL: +abdominal pain GENITOURINARY: no polyuria, no dysuria NEUROLOGICAL: No headache, dizziness, syncope, paralysis, ataxia, numbness or tingling in the extremities.  No change in bowel or bladder control.  MUSCULOSKELETAL: No muscle, back pain, joint pain or stiffness.  HEMATOLOGIC: No anemia, bleeding or bruising.  LYMPHATICS: No enlarged nodes. No history of splenectomy.  PSYCHIATRIC: No history of depression or anxiety.      Physical Examination Blood pressure 123/90, pulse (!) 124, temperature 97.9 F (36.6 C), temperature source Oral, resp. rate (!) 30, height 5\' 3"  (1.6 m), weight 195 lb (88.5 kg), last menstrual period 09/28/2012, SpO2 97 %.  Intake/Output Summary (Last 24 hours) at 10/29/15 0944 Last data filed at 10/29/15 [id number]  Gross per 24 hour  Intake                0 ml  Output              650 ml  Net             -650 ml    HEENT: sclera clear, throat clear  Cardiovascular: irregular, tachy 130, no m/r/g, no jvd  Respiratory: crackles bilatearl bases  GI: adbomden distended, soft, NT  MSK: no LE edema  Neuro: no focal deficits  Psych: appropriate affect   Lab Results  Basic Metabolic Panel:  Recent Labs Lab 10/29/15 0735  NA 139  K 3.8  CL 107  CO2 25  GLUCOSE 121*  BUN 27*  CREATININE 0.86  CALCIUM 9.1    Liver Function Tests:  Recent Labs Lab 10/29/15 0735  AST 22  ALT 22  ALKPHOS 74  BILITOT 1.0  PROT 6.9  ALBUMIN 4.0    CBC:  Recent Labs Lab 10/29/15 0735  WBC 8.3  NEUTROABS 5.6  HGB 13.4  HCT 40.9  MCV 89.5  PLT 284    Cardiac Enzymes:  Recent Labs Lab 10/29/15 0735  TROPONINI <0.03    BNP: Invalid input(s): POCBNP     Impression/Recommendations 1. Afib with RVR - has failed amiodarone loading with prior DCCV recently, back in afib with RVR - being considered for ablation procedure as outpatient, she has not committed however.  - will wean dilt gtt given her low LVEF. With low LVEF and afib with RVR will load with IV digoxin. Loading dose 0.375mg  IV x 1 then 0.25mg  IV q 6 hrs x  Doses (approx 27mcg/kg loading dose total). We will change her Toprol XL to short acting  lopressor at this time to allow more room for titration. Of note had some limitation with av nodal agents during last admission due to soft bp's.  - continue anticoag, CHADS2Vasc score of 3 - can consider esmolol drip if rates not improved with digoxin loading   2. Acute on chronic systolic HF - exacerbation possibly related to afib with uncontrolled rates - NICM, possible tachycardia mediated CM - will diurese with IV lasix, she received 40mg  in ER and has put on 1.2 liters already. Will give additional 40mg  IV later today.      Carlyle Dolly, M.D.

## 2015-10-29 NOTE — ED Notes (Signed)
Resp tx notified of ABG

## 2015-10-29 NOTE — ED Triage Notes (Signed)
Pt comes in today w/ c/o abdominal pain 9/10. Per pt pain onset >1 wk but presents today bc pain is keeping her up for nights, with loss of appetite. Pt denies nausea/vomitting, and diarrhea. Per pt hx of hiatal hernia. Pt AOx4, family at bedside.

## 2015-10-29 NOTE — ED Notes (Signed)
O2 at 2 L/min via [place] initiated per dr Wyvonnia Dusky

## 2015-10-29 NOTE — ED Notes (Signed)
DR Harl Bowie at bedside

## 2015-10-29 NOTE — ED Notes (Addendum)
Pt c/o of SOB. EKG completed. Rancour, MD given strip. Shirlean Mylar, RN notified

## 2015-10-29 NOTE — Telephone Encounter (Signed)
Follow up   [name] Fry is calling to set up a procedure . Please call   Thanks

## 2015-10-29 NOTE — ED Notes (Signed)
Oxygen sensor twisted on finger. Repositioned O2 at increase 93%

## 2015-10-29 NOTE — H&P (Signed)
History and Physical    [name] Fry [id number] DOB: [birth year] DOA: 10/29/2015  Referring MD/NP/PA: Ezequiel Essex, EDP PCP: [name], MD  Patient coming from: Home  Chief Complaint: Abdominal pain, SOB  HPI: CARNATION [name] Fry is a 70 y.o. female with h/o a fib, systolic CHF with NICM who has been having epigastric and RUQ abdominal pain for 2 days, also has been SOB. She comes in today for evaluation and is found to be in a fib with RVR with rates in the 140s. Started on cardizem drip. Seen by cardiology how has discontinued cardizem and started on digoxin. CXR shows pulmonary edema. Admission requested for further evaluation and management.  Past Medical/Surgical History: Past Medical History:  Diagnosis Date  . Arthritis   . Atrial fibrillation [facility])    Documented January 2017  . Breast cancer ([place])    Left  . Cataracts, bilateral   . Coronary atherosclerosis of native coronary artery    DES OM 3/12  . Essential hypertension, benign   . GERD (gastroesophageal reflux disease)   . H/O hiatal hernia   . Hyperlipidemia   . Hypothyroidism     Past Surgical History:  Procedure Laterality Date  . [place] VITRECTOMY WITH 20 GAUGE MVR PORT Right 09/29/2012   Procedure: 25 GAUGE PARS PLANA VITRECTOMY WITH 20 GAUGE MVR PORT;  Surgeon: Hayden Pedro, MD;  Location: [place];  Service: Ophthalmology;  Laterality: Right;  . APPENDECTOMY    . CARDIAC CATHETERIZATION N/A 07/26/2015   Procedure: Left Heart Cath and Coronary Angiography;  Surgeon: [name], MD;  Location: [place] [facility];  Service: Cardiovascular;  Laterality: N/A;  . CARDIOVERSION N/A 08/30/2015   Procedure: CARDIOVERSION;  Surgeon: Satira Sark, MD;  [facility];  Service: Cardiovascular;  Laterality: N/A;  . CATARACT EXTRACTION W/PHACO Right 05/18/2013   Procedure: CATARACT EXTRACTION PHACO AND INTRAOCULAR LENS PLACEMENT RIGHT EYE;  Surgeon: Tonny Branch, MD;  [facility]: [facility];  Service:  Ophthalmology;  Laterality: Right;  CDE:13.35  . CHOLECYSTECTOMY    . GAS/FLUID EXCHANGE Right 09/29/2012   Procedure: GAS/FLUID EXCHANGE;  Surgeon: Hayden Pedro, MD;  Location: [place];  Service: Ophthalmology;  Laterality: Right;  . LASER PHOTO ABLATION Right 09/29/2012   Procedure: LASER PHOTO ABLATION;  Surgeon: Hayden Pedro, MD;  Location: [place];  Service: Ophthalmology;  Laterality: Right;  Headscope laser  . MASTECTOMY Left    1997; S/P chemotherapy/radiation  . MEMBRANE PEEL Right 09/29/2012   Procedure: MEMBRANE PEEL;  Surgeon: Hayden Pedro, MD;  Location: [place];  Service: Ophthalmology;  Laterality: Right;  . TUBAL LIGATION      Social History:  reports that she has never smoked. She has never used smokeless tobacco. She reports that she does not drink alcohol or use drugs.  Allergies: Allergies  Allergen Reactions  . Doxycycline Rash    Family History:  Family History  Problem Relation Age of Onset  . Heart attack Father 18    Prior to Admission medications   Medication Sig Start Date End Date Taking? Authorizing Provider  furosemide (LASIX) 40 MG tablet Take 1 tablet (40 mg total) by mouth daily as needed for fluid (or weight gain). 08/14/15  Yes Erline Hau, MD  levothyroxine (SYNTHROID, LEVOTHROID) 112 MCG tablet Take 112 mcg by mouth daily.    Yes Historical Provider, MD  metoprolol succinate (TOPROL-XL) 50 MG 24 hr tablet Take 1 tablet (50 mg total) by mouth  2 (two) times daily. 09/25/15  Yes Thompson Grayer, MD  Multiple Vitamins-Minerals (OCUVITE PRESERVISION PO) Take 2 tablets by mouth daily.   Yes Historical Provider, MD  NITROSTAT 0.4 MG SL tablet PLACE 1 TABLET UNDER THE TONGUE EVERY 5 MINUTES FOR 3 DOSES AS NEEDED CHEST PAIN 09/27/15  Yes Satira Sark, MD  rivaroxaban (XARELTO) 20 MG TABS tablet Take 1 tablet (20 mg total) by mouth daily with supper. 05/23/15  Yes Satira Sark, MD  Vitamin D, Cholecalciferol, 1000 units CAPS Take 1  capsule by mouth 2 (two) times daily.   Yes Historical Provider, MD    Review of Systems:  Constitutional: Denies fever, chills, diaphoresis, appetite change and fatigue.  HEENT: Denies photophobia, eye pain, redness, hearing loss, ear pain, congestion, sore throat, rhinorrhea, sneezing, mouth sores, trouble swallowing, neck pain, neck stiffness and tinnitus.   Respiratory: Denies cough, chest tightness,  and wheezing.   Cardiovascular: Denies chest pain and leg swelling.  Gastrointestinal: Denies nausea, vomiting, abdominal pain, diarrhea, constipation, blood in stool and abdominal distention.  Genitourinary: Denies dysuria, urgency, frequency, hematuria, flank pain and difficulty urinating.  Endocrine: Denies: hot or cold intolerance, sweats, changes in hair or nails, polyuria, polydipsia. Musculoskeletal: Denies myalgias, back pain, joint swelling, arthralgias and gait problem.  Skin: Denies pallor, rash and wound.  Neurological: Denies dizziness, seizures, syncope, weakness, light-headedness, numbness and headaches.  Hematological: Denies adenopathy. Easy bruising, personal or family bleeding history  Psychiatric/Behavioral: Denies suicidal ideation, mood changes, confusion, nervousness, sleep disturbance and agitation    Physical Exam: Vitals:   10/29/15 1334 10/29/15 1400 10/29/15 1500 10/29/15 1600  BP: (!) 120/106 (!) 126/91 (!) 115/92   Pulse: (!) 109 (!) 138 97   Resp: (!) 28 20 (!) 31   Temp:    97 F (36.1 C)  TempSrc:    Oral  SpO2: 100% 100% 98%   Weight:      Height:         Constitutional: NAD, calm, comfortable Eyes: PERRL, lids and conjunctivae normal ENMT: Mucous membranes are moist. Posterior pharynx clear of any exudate or lesions.Normal dentition.  Neck: normal, supple, no masses, no thyromegaly Respiratory:bilateral crackles Cardiovascular:ir Regular rate and rhythm, no murmurs / rubs / gallops. No extremity edema. 2+ pedal pulses. No carotid bruits.    Abdomen: no tenderness, no masses palpated. No hepatosplenomegaly. Bowel sounds positive.  Musculoskeletal: no clubbing / cyanosis. No joint deformity upper and lower extremities. Good ROM, no contractures. Normal muscle tone.  Skin: no rashes, lesions, ulcers. No induration Neurologic: CN 2-12 grossly intact. Sensation intact, DTR normal. Strength 5/5 in all 4.  Psychiatric: Normal judgment and insight. Alert and oriented x 3. Normal mood.    Labs on Admission: I have personally reviewed the following labs and imaging studies  CBC:  Recent Labs Lab 10/29/15 0735  WBC 8.3  NEUTROABS 5.6  HGB 13.4  HCT 40.9  MCV 89.5  PLT [id number]   Basic Metabolic Panel:  Recent Labs Lab 10/29/15 0735  NA 139  K 3.8  CL 107  CO2 25  GLUCOSE 121*  BUN 27*  CREATININE 0.86  CALCIUM 9.1   GFR: Estimated Creatinine Clearance: 63.4 mL/min (by C-G formula based on SCr of 0.86 mg/dL). Liver Function Tests:  Recent Labs Lab 10/29/15 0735  AST 22  ALT 22  ALKPHOS 74  BILITOT 1.0  PROT 6.9  ALBUMIN 4.0    Recent Labs Lab 10/29/15 0735  LIPASE 20   No results for input(s):  AMMONIA in the last 168 hours. Coagulation Profile:  Recent Labs Lab 10/29/15 0735  INR 1.71   Cardiac Enzymes:  Recent Labs Lab 10/29/15 0735  TROPONINI <0.03   BNP (last 3 results) No results for input(s): PROBNP in the last 8760 hours. HbA1C: No results for input(s): HGBA1C in the last 72 hours. CBG: No results for input(s): GLUCAP in the last 168 hours. Lipid Profile: No results for input(s): CHOL, HDL, LDLCALC, TRIG, CHOLHDL, LDLDIRECT in the last 72 hours. Thyroid Function Tests:  Recent Labs  10/29/15 0735  TSH 5.133*   Anemia Panel: No results for input(s): VITAMINB12, FOLATE, FERRITIN, TIBC, IRON, RETICCTPCT in the last 72 hours. Urine analysis:    Component Value Date/Time   COLORURINE YELLOW 10/29/2015 0813   APPEARANCEUR CLEAR 10/29/2015 0813   LABSPEC 1.010 10/29/2015 0813    PHURINE 6.0 10/29/2015 0813   GLUCOSEU NEGATIVE 10/29/2015 0813   HGBUR NEGATIVE 10/29/2015 0813   BILIRUBINUR NEGATIVE 10/29/2015 0813   KETONESUR NEGATIVE 10/29/2015 0813   PROTEINUR TRACE (A) 10/29/2015 0813   NITRITE NEGATIVE 10/29/2015 0813   LEUKOCYTESUR NEGATIVE 10/29/2015 0813   Sepsis Labs: @LABRCNTIP (procalcitonin:4,lacticidven:4) ) Recent Results (from the past 240 hour(s))  MRSA PCR Screening     Status: None   Collection Time: 10/29/15 11:14 AM  Result Value Ref Range Status   MRSA by PCR NEGATIVE NEGATIVE Final    Comment:        The GeneXpert MRSA Assay (FDA approved for NASAL specimens only), is one component of a comprehensive MRSA colonization surveillance program. It is not intended to diagnose MRSA infection nor to guide or monitor treatment for MRSA infections.      Radiological Exams on Admission: Dg Chest Portable 1 View  Result Date: 10/29/2015 CLINICAL DATA:  Chest pain for 1 week. Shortness of breath. History of breast carcinoma EXAM: PORTABLE CHEST 1 VIEW COMPARISON:  Aug 10, 2015 FINDINGS: There are small pleural effusions bilaterally. There is slight interstitial edema in the mid lower lung zones. There is no airspace consolidation. Heart is mildly enlarged with mild pulmonary venous hypertension. No adenopathy evident. There are surgical clips in the left axilla. There are foci of calcification in the aortic arch. IMPRESSION: Findings most indicative of a degree of underlying congestive heart failure. No airspace consolidation or demonstrable adenopathy. There is aortic atherosclerosis. Clips noted in left axillary region. Electronically Signed   By: Lowella Grip III M.D.   On: 10/29/2015 07:47    EKG: Independently reviewed. A fib at a rate of 120.  Assessment/Plan Principal Problem:   Atrial fibrillation with RVR (HCC) Active Problems:   Hyperlipidemia   Hypothyroidism   Nonischemic cardiomyopathy (HCC)   Acute on chronic systolic  CHF (congestive heart failure) (HCC)    A Fib with RVR -Seen by cardiology: on digoxin and lopressor. -Failed amio and DCCV in past. -Contemplating ablation procedure. -Anticoagulated on eliquis  Acute on Chronic Systolic CHF -ECHO [id number]: EF 35-40% with global hypokinesis -Lasix, strict Is and Os, strive for negative fluid balance -Cardiology following.  Hypothyroidism -Continue home dose of synthroid, check TSH.   DVT prophylaxis: Eliquis  Code Status: full code  Family Communication: daughter at bedside updated on plan of care and all questions answered  Disposition Plan: to be determined  Consults called: cardiology  Admission status: inpatient    Time Spent: 75 minutes  Lelon Frohlich MD [facility] Pager [phone]  If 7PM-7AM, please contact night-coverage [email or web address] Password [email or web address]  10/29/2015, 5:15 PM

## 2015-10-29 NOTE — ED Provider Notes (Signed)
[facility] DEPT Provider Note   CSN: [id number] Arrival date & time: 10/29/15  [id number]     History   Chief Complaint Chief Complaint  Patient presents with  . Abdominal Pain    HPI [name] Fry is a 70 y.o. female.  Patient from home with 2 weeks of of upper abdominal pain and progressively worsening SOB. Pain is constant, progressively worsening. Associated with poor appetite and nausea but no vomiting. No diarrhea. No chest pain. No leg swelling or change in weight. Does have history of hiatal hernia. Also has had worsening inability to lay flat.  Hx CHF, takes lasix only "as needed" 2-3 weekly. Has had worsening fatigue and inability to sleep. Hx atrial fibrillation, hypothyroid, CAD with 1 stent.   The history is provided by the patient and a relative. The history is limited by the condition of the patient.  Abdominal Pain   Pertinent negatives include fever, nausea, vomiting, dysuria, hematuria, arthralgias and myalgias.    Past Medical History:  Diagnosis Date  . Arthritis   . Atrial fibrillation [facility])    Documented January 2017  . Breast cancer ([place])    Left  . Cataracts, bilateral   . Coronary atherosclerosis of native coronary artery    DES OM 3/12  . Essential hypertension, benign   . GERD (gastroesophageal reflux disease)   . H/O hiatal hernia   . Hyperlipidemia   . Hypothyroidism     Patient Active Problem List   Diagnosis Date Noted  . Nonischemic cardiomyopathy ([place]) 09/27/2015  . Fatigue 09/27/2015  . Persistent atrial fibrillation ([place])   . Acute systolic CHF (congestive heart failure) ([place]) 08/12/2015  . Hypotension 08/09/2015  . Metabolic acidosis [id number]  . CAP (community acquired pneumonia) 08/08/2015  . Atrial fibrillation with RVR ([place]) 07/26/2015  . Exertional dyspnea   . Coronary atherosclerosis of native coronary artery 07/04/2010  . Hyperlipidemia 06/05/2010  . Hypothyroidism 06/05/2010    Past Surgical History:  Procedure  Laterality Date  . [place] VITRECTOMY WITH 20 GAUGE MVR PORT Right 09/29/2012   Procedure: 25 GAUGE PARS PLANA VITRECTOMY WITH 20 GAUGE MVR PORT;  Surgeon: Hayden Pedro, MD;  Location: [place];  Service: Ophthalmology;  Laterality: Right;  . APPENDECTOMY    . CARDIAC CATHETERIZATION N/A 07/26/2015   Procedure: Left Heart Cath and Coronary Angiography;  Surgeon: [name], MD;  Location: [place] CV LAB;  Service: Cardiovascular;  Laterality: N/A;  . CARDIOVERSION N/A 08/30/2015   Procedure: CARDIOVERSION;  Surgeon: Satira Sark, MD;  [facility]: [facility];  Service: Cardiovascular;  Laterality: N/A;  . CATARACT EXTRACTION W/PHACO Right 05/18/2013   Procedure: CATARACT EXTRACTION PHACO AND INTRAOCULAR LENS PLACEMENT RIGHT EYE;  Surgeon: Tonny Branch, MD;  Location: [facility];  Service: Ophthalmology;  Laterality: Right;  CDE:13.35  . CHOLECYSTECTOMY    . GAS/FLUID EXCHANGE Right 09/29/2012   Procedure: GAS/FLUID EXCHANGE;  Surgeon: Hayden Pedro, MD;  Location: [place];  Service: Ophthalmology;  Laterality: Right;  . LASER PHOTO ABLATION Right 09/29/2012   Procedure: LASER PHOTO ABLATION;  Surgeon: Hayden Pedro, MD;  Location: [place];  Service: Ophthalmology;  Laterality: Right;  Headscope laser  . MASTECTOMY Left    1997; S/P chemotherapy/radiation  . MEMBRANE PEEL Right 09/29/2012   Procedure: MEMBRANE PEEL;  Surgeon: Hayden Pedro, MD;  Location: [place];  Service: Ophthalmology;  Laterality: Right;  . TUBAL LIGATION      OB History    No data  available       Home Medications    Prior to Admission medications   Medication Sig Start Date End Date Taking? Authorizing Provider  furosemide (LASIX) 40 MG tablet Take 1 tablet (40 mg total) by mouth daily as needed for fluid (or weight gain). 08/14/15   Erline Hau, MD  levothyroxine (SYNTHROID, LEVOTHROID) 112 MCG tablet Take 112 mcg by mouth daily.     Historical Provider, MD  metoprolol succinate  (TOPROL-XL) 50 MG 24 hr tablet Take 1 tablet (50 mg total) by mouth 2 (two) times daily. 09/25/15   Thompson Grayer, MD  Multiple Vitamins-Minerals (OCUVITE PRESERVISION PO) Take 2 tablets by mouth daily.    Historical Provider, MD  NITROSTAT 0.4 MG SL tablet PLACE 1 TABLET UNDER THE TONGUE EVERY 5 MINUTES FOR 3 DOSES AS NEEDED CHEST PAIN 09/27/15   Satira Sark, MD  rivaroxaban (XARELTO) 20 MG TABS tablet Take 1 tablet (20 mg total) by mouth daily with supper. 05/23/15   Satira Sark, MD  Vitamin D, Cholecalciferol, 1000 units CAPS Take 1 capsule by mouth 2 (two) times daily.    Historical Provider, MD    Family History Family History  Problem Relation Age of Onset  . Heart attack Father 62    Social History Social History  Substance Use Topics  . Smoking status: Never Smoker  . Smokeless tobacco: Never Used  . Alcohol use No     Allergies   Doxycycline   Review of Systems Review of Systems  Constitutional: Positive for activity change, appetite change and fatigue. Negative for fever.  HENT: Negative for congestion and rhinorrhea.   Eyes: Negative for visual disturbance.  Respiratory: Positive for shortness of breath. Negative for cough and chest tightness.   Cardiovascular: Positive for palpitations and leg swelling. Negative for chest pain.  Gastrointestinal: Positive for abdominal pain. Negative for nausea and vomiting.  Genitourinary: Negative for dysuria, hematuria, vaginal bleeding and vaginal discharge.  Musculoskeletal: Negative for arthralgias and myalgias.  Skin: Negative for wound.  Neurological: Positive for weakness. Negative for dizziness, light-headedness and numbness.  A complete 10 system review of systems was obtained and all systems are negative except as noted in the HPI and PMH.     Physical Exam Updated Vital Signs BP (!) 135/102 (BP Location: Right Arm) Comment: Primary RN aware  Pulse 112   Temp 97.9 F (36.6 C) (Oral)   Ht 5\' 3"  (1.6 m)    Wt 195 lb (88.5 kg)   LMP 09/28/2012   SpO2 97%   BMI 34.54 kg/m   Physical Exam  Constitutional: She is oriented to person, place, and time. She appears well-developed and well-nourished. She appears distressed.  Pale appearing, dyspneic with conversation  HENT:  Head: Normocephalic and atraumatic.  Mouth/Throat: Oropharynx is clear and moist. No oropharyngeal exudate.  Eyes: Conjunctivae and EOM are normal. Pupils are equal, round, and reactive to light.  Neck: Normal range of motion. Neck supple.  No meningismus.  Cardiovascular: Normal rate, normal heart sounds and intact distal pulses.   No murmur heard. Irregular tachycardia  Pulmonary/Chest: Effort normal. No respiratory distress. She has rales.  Crackle at bases  Abdominal: Soft. There is tenderness. There is no rebound and no guarding.  Diffuse upper abdominal tenderness  Musculoskeletal: Normal range of motion. She exhibits edema. She exhibits no tenderness.  Trace pedal edema  Neurological: She is alert and oriented to person, place, and time. No cranial nerve deficit. She exhibits normal muscle  tone. Coordination normal.  No ataxia on finger to nose bilaterally. No pronator drift. 5/5 strength throughout. CN 2-12 intact.Equal grip strength. Sensation intact.   Skin: Skin is warm.  Psychiatric: She has a normal mood and affect. Her behavior is normal.  Nursing note and vitals reviewed.    ED Treatments / Results  Labs (all labs ordered are listed, but only abnormal results are displayed) Labs Reviewed  COMPREHENSIVE METABOLIC PANEL - Abnormal; Notable for the following:       Result Value   Glucose, Bld 121 (*)    BUN 27 (*)    All other components within normal limits  PROTIME-INR - Abnormal; Notable for the following:    Prothrombin Time 20.3 (*)    All other components within normal limits  BRAIN NATRIURETIC PEPTIDE - Abnormal; Notable for the following:    B Natriuretic Peptide 1,555.0 (*)    All other  components within normal limits  CBC WITH DIFFERENTIAL/PLATELET  TROPONIN I  URINALYSIS, ROUTINE W REFLEX MICROSCOPIC (NOT AT [facility])  TSH  LIPASE, BLOOD    EKG  EKG Interpretation  Date/Time:  Tuesday October 29 2015 07:09:34 EDT Ventricular Rate:  121 PR Interval:    QRS Duration: 132 QT Interval:  327 QTC Calculation: 464 R Axis:   -76 Text Interpretation:  Atrial fibrillation Left bundle branch block Baseline wander in lead(s) V6 No significant change was found Confirmed by Wyvonnia Dusky  MD, Ameer Sanden [phone]) on 10/29/2015 7:23:33 AM       Radiology Dg Chest Portable 1 View  Result Date: 10/29/2015 CLINICAL DATA:  Chest pain for 1 week. Shortness of breath. History of breast carcinoma EXAM: PORTABLE CHEST 1 VIEW COMPARISON:  Aug 10, 2015 FINDINGS: There are small pleural effusions bilaterally. There is slight interstitial edema in the mid lower lung zones. There is no airspace consolidation. Heart is mildly enlarged with mild pulmonary venous hypertension. No adenopathy evident. There are surgical clips in the left axilla. There are foci of calcification in the aortic arch. IMPRESSION: Findings most indicative of a degree of underlying congestive heart failure. No airspace consolidation or demonstrable adenopathy. There is aortic atherosclerosis. Clips noted in left axillary region. Electronically Signed   By: Lowella Grip III M.D.   On: 10/29/2015 07:47    Procedures Procedures (including critical care time)  Medications Ordered in ED Medications  diltiazem (CARDIZEM) 1 mg/mL load via infusion 10 mg (not administered)    And  diltiazem (CARDIZEM) 100 mg in dextrose 5 % 100 mL (1 mg/mL) infusion (not administered)  furosemide (LASIX) injection 40 mg (not administered)     Initial Impression / Assessment and Plan / ED Course  I have reviewed the triage vital signs and the nursing notes.  Pertinent labs & imaging results that were available during my care of the patient were  reviewed by me and considered in my medical decision making (see chart for details).  Clinical Course   2 weeks of progressively worsening upper abdominal pain with shortness of breath, inability to lay flat, orthopnea and poor appetite. No chest pain. Patient in atrial fibrillation with RVR on arrival and pale appearing. Last dose of xarelto yesterday.  Workup consistent with CHF exacerbation which is likely causing her orthopnea, PND and abdominal pain due to fluid overload. Patient started on Cardizem drip for rapid atrial fibrillation as well as IV Lasix.  Record review shows she has difficult to control atrial fibrillation and has failed ablation in the past.  Discussed with Dr.  branch who will evaluate. Agrees with Cardizem drip and IV Lasix. Requested admission to the hospitalist.  Admission d/w Dr. Jerilee Hoh.  CRITICAL CARE Performed by: Ezequiel Essex Total critical care time: 40 minutes Critical care time was exclusive of separately billable procedures and treating other patients. Critical care was necessary to treat or prevent imminent or life-threatening deterioration. Critical care was time spent personally by me on the following activities: development of treatment plan with patient and/or surrogate as well as nursing, discussions with consultants, evaluation of patient's response to treatment, examination of patient, obtaining history from patient or surrogate, ordering and performing treatments and interventions, ordering and review of laboratory studies, ordering and review of radiographic studies, pulse oximetry and re-evaluation of patient's condition.  Final Clinical Impressions(s) / ED Diagnoses   Final diagnoses:  Acute on chronic systolic congestive heart failure (HCC)  Atrial fibrillation with RVR [facility])    New Prescriptions New Prescriptions   No medications on file     Ezequiel Essex, MD 10/29/15 1721

## 2015-10-30 ENCOUNTER — Ambulatory Visit: Payer: Self-pay | Admitting: Cardiology

## 2015-10-30 DIAGNOSIS — E785 Hyperlipidemia, unspecified: Secondary | ICD-10-CM

## 2015-10-30 DIAGNOSIS — I429 Cardiomyopathy, unspecified: Secondary | ICD-10-CM

## 2015-10-30 LAB — BASIC METABOLIC PANEL
ANION GAP: 9 (ref 5–15)
BUN: 24 mg/dL — ABNORMAL HIGH (ref 6–20)
CHLORIDE: 103 mmol/L (ref 101–111)
CO2: 28 mmol/L (ref 22–32)
Calcium: 9 mg/dL (ref 8.9–10.3)
Creatinine, Ser: 0.88 mg/dL (ref 0.44–1.00)
GFR calc non Af Amer: >60 mL/min (ref >60)
Glucose, Bld: 92 mg/dL (ref 65–99)
POTASSIUM: 3 mmol/L — AB (ref 3.5–5.1)
SODIUM: 140 mmol/L (ref 135–145)

## 2015-10-30 LAB — MAGNESIUM: MAGNESIUM: 1.7 mg/dL (ref 1.7–2.4)

## 2015-10-30 MED ORDER — DIGOXIN 125 MCG PO TABS
0.1250 mg | ORAL_TABLET | Freq: Every day | ORAL | Status: DC
  Administered 2015-10-30: 0.125 mg via ORAL
  Filled 2015-10-30: qty 1

## 2015-10-30 MED ORDER — MAGNESIUM SULFATE 2 GM/50ML IV SOLN
2.0000 g | Freq: Once | INTRAVENOUS | Status: AC
  Administered 2015-10-30: 2 g via INTRAVENOUS
  Filled 2015-10-30: qty 50

## 2015-10-30 MED ORDER — HYDRALAZINE HCL 20 MG/ML IJ SOLN
10.0000 mg | Freq: Four times a day (QID) | INTRAMUSCULAR | Status: DC | PRN
  Filled 2015-10-30: qty 1

## 2015-10-30 MED ORDER — POTASSIUM CHLORIDE CRYS ER 20 MEQ PO TBCR
40.0000 meq | EXTENDED_RELEASE_TABLET | Freq: Two times a day (BID) | ORAL | Status: AC
  Administered 2015-10-30 (×2): 40 meq via ORAL
  Filled 2015-10-30 (×2): qty 2

## 2015-10-30 NOTE — Progress Notes (Signed)
PROGRESS NOTE    [name] Fry  [id number] DOB: [birth year] DOA: 10/29/2015 PCP: [name], MD    Brief Narrative:  70 y.o. female with h/o a fib, systolic CHF with NICM who has been having epigastric and RUQ abdominal pain for 2 days, also has been SOB. She comes in today for evaluation and is found to be in a fib with RVR with rates in the 140s. Started on cardizem drip. Seen by cardiology how has discontinued cardizem and started on digoxin. CXR shows pulmonary edema. Admission requested for further evaluation and management.  Assessment & Plan:   Principal Problem:   Atrial fibrillation with RVR (HCC) Active Problems:   Hyperlipidemia   Hypothyroidism   Nonischemic cardiomyopathy (HCC)   Acute on chronic systolic CHF (congestive heart failure) ([place])   1. Atrial fibrillation with rapid ventricular rate 1. Initially a car Cardizem drip on admission 2. Continues her Xarelto for secondary stroke prevention. 3. Cardiology consulted. 4. Patient transition to digoxin 5. Heart rate now better controlled 6. Discussed case with cardiology, recommendations to continue digoxin as tolerated 2. Hyperlipidemia 1. Not on statin  3. Hypothyroidism 1. Continue levothyroxine as tolerated  4. Nonischemic cardiomyopathy 1. Appear stable at this time  5. Acute on chronic systolic congestive heart failure 1.  continue twice a day Lasix as tolerated  DVT prophylaxis:  on therapeutics her alto Code Status:  full code Family Communication:  patient in room, family at bedside Disposition Plan:  possible discharge home in 24 hours if stable  Consultants:    Cardiology  Procedures:     Antimicrobials: Anti-infectives    None       Subjective: No complaints at this time. Patient eager to go home  Objective: Vitals:   10/30/15 1500 10/30/15 1600 10/30/15 1634 10/30/15 1700  BP:      Pulse: 65 67  93  Resp: (!) 21 (!) 21  19  Temp:   97.7 F (36.5 C)   TempSrc:   Axillary    SpO2: 95% 100%  99%  Weight:      Height:        Intake/Output Summary (Last 24 hours) at 10/30/15 1837 Last data filed at 10/30/15 1828  Gross per 24 hour  Intake              720 ml  Output             2050 ml  Net            -1330 ml   Filed Weights   10/29/15 0702 10/29/15 1151 10/30/15 0500  Weight: 88.5 kg (195 lb) 88.7 kg (195 lb 8.8 oz) 88.8 kg (195 lb 12.3 oz)    Examination:  General exam: Appears calm and comfortable, Lying in bed   Respiratory system: Clear to auscultation. Respiratory effort normal. Cardiovascular system: S1 & S2 heard,  irregularly irregular  Gastrointestinal system: Abdomen is nondistended, soft and nontender. No organomegaly or masses felt. Normal bowel sounds heard. Central nervous system: Alert and oriented. No focal neurological deficits. Extremities: Symmetric 5 x 5 power. Skin: No rashes, lesions  Psychiatry: Judgement and insight appear normal. Mood & affect appropriate.   Data Reviewed: I have personally reviewed following labs and imaging studies  CBC:  Recent Labs Lab 10/29/15 0735  WBC 8.3  NEUTROABS 5.6  HGB 13.4  HCT 40.9  MCV 89.5  PLT [id number]   Basic Metabolic Panel:  Recent Labs Lab 10/29/15 0735 10/30/15 0415 10/30/15 0830  NA 139 140  --   K 3.8 3.0*  --   CL 107 103  --   CO2 25 28  --   GLUCOSE 121* 92  --   BUN 27* 24*  --   CREATININE 0.86 0.88  --   CALCIUM 9.1 9.0  --   MG  --   --  1.7   GFR: Estimated Creatinine Clearance: 62 mL/min (by C-G formula based on SCr of 0.88 mg/dL). Liver Function Tests:  Recent Labs Lab 10/29/15 0735  AST 22  ALT 22  ALKPHOS 74  BILITOT 1.0  PROT 6.9  ALBUMIN 4.0    Recent Labs Lab 10/29/15 0735  LIPASE 20   No results for input(s): AMMONIA in the last 168 hours. Coagulation Profile:  Recent Labs Lab 10/29/15 0735  INR 1.71   Cardiac Enzymes:  Recent Labs Lab 10/29/15 0735  TROPONINI <0.03   BNP (last 3 results) No results for input(s):  PROBNP in the last 8760 hours. HbA1C: No results for input(s): HGBA1C in the last 72 hours. CBG: No results for input(s): GLUCAP in the last 168 hours. Lipid Profile: No results for input(s): CHOL, HDL, LDLCALC, TRIG, CHOLHDL, LDLDIRECT in the last 72 hours. Thyroid Function Tests:  Recent Labs  10/29/15 0735  TSH 5.133*   Anemia Panel: No results for input(s): VITAMINB12, FOLATE, FERRITIN, TIBC, IRON, RETICCTPCT in the last 72 hours. Sepsis Labs: No results for input(s): PROCALCITON, LATICACIDVEN in the last 168 hours.  Recent Results (from the past 240 hour(s))  MRSA PCR Screening     Status: None   Collection Time: 10/29/15 11:14 AM  Result Value Ref Range Status   MRSA by PCR NEGATIVE NEGATIVE Final    Comment:        The GeneXpert MRSA Assay (FDA approved for NASAL specimens only), is one component of a comprehensive MRSA colonization surveillance program. It is not intended to diagnose MRSA infection nor to guide or monitor treatment for MRSA infections.      Radiology Studies: Dg Chest Portable 1 View  Result Date: 10/29/2015 CLINICAL DATA:  Chest pain for 1 week. Shortness of breath. History of breast carcinoma EXAM: PORTABLE CHEST 1 VIEW COMPARISON:  Aug 10, 2015 FINDINGS: There are small pleural effusions bilaterally. There is slight interstitial edema in the mid lower lung zones. There is no airspace consolidation. Heart is mildly enlarged with mild pulmonary venous hypertension. No adenopathy evident. There are surgical clips in the left axilla. There are foci of calcification in the aortic arch. IMPRESSION: Findings most indicative of a degree of underlying congestive heart failure. No airspace consolidation or demonstrable adenopathy. There is aortic atherosclerosis. Clips noted in left axillary region. Electronically Signed   By: Lowella Grip III M.D.   On: 10/29/2015 07:47    Scheduled Meds: . aspirin EC  81 mg Oral Daily  . digoxin  0.125 mg Oral  Daily  . furosemide  20 mg Intravenous BID  . levothyroxine  112 mcg Oral QAC breakfast  . metoprolol tartrate  25 mg Oral Q6H  . potassium chloride  40 mEq Oral BID  . rivaroxaban  20 mg Oral Q supper  . sodium chloride flush  3 mL Intravenous Q12H   Continuous Infusions:    LOS: 1 day   CHIU, Orpah Melter, MD [facility] Pager [phone]  If 7PM-7AM, please contact night-coverage [email or web address] Password [facility] 10/30/2015, 6:37 PM

## 2015-10-30 NOTE — Care Management Important Message (Signed)
Important Message  Patient Details  Name: [name] Fry MRN: [id number] Date of Birth: 1946/01/02   Medicare Important Message Given:  Yes    Sherald Barge, RN 10/30/2015, 4:06 PM

## 2015-10-30 NOTE — Progress Notes (Signed)
Subjective:    Abdominal discomfort improving. No SOB or palpitations.   Objective:   Temp:  [97 F (36.1 C)-97.8 F (36.6 C)] 97.8 F (36.6 C) (08/16 0820) Pulse Rate:  [25-138] 57 (08/16 0500) Resp:  [16-32] 17 (08/16 0500) BP: (96-152)/(69-139) 119/84 (08/16 0500) SpO2:  [92 %-100 %] 96 % (08/16 0500) Weight:  [195 lb 8.8 oz (88.7 kg)-195 lb 12.3 oz (88.8 kg)] 195 lb 12.3 oz (88.8 kg) (08/16 0500) Last BM Date: 10/29/15  Filed Weights   10/29/15 0702 10/29/15 1151 10/30/15 0500  Weight: 195 lb (88.5 kg) 195 lb 8.8 oz (88.7 kg) 195 lb 12.3 oz (88.8 kg)    Intake/Output Summary (Last 24 hours) at 10/30/15 0927 Last data filed at 10/30/15 0500  Gross per 24 hour  Intake             1110 ml  Output             2100 ml  Net             -990 ml    Telemetry: afib variable rates  Exam:  General: NAD  HEENT: sclera clear, throat clear  Resp: mild crackles bilateral bases  Cardiac: irreg, no m/r/g, no jvd  GI: abdomen soft, NT, ND  MSK: no LE edema  Neuro: no focal deficits  Psych: appropriate affect  Lab Results:  Basic Metabolic Panel:  Recent Labs Lab 10/29/15 0735 10/30/15 0415 10/30/15 0830  NA 139 140  --   K 3.8 3.0*  --   CL 107 103  --   CO2 25 28  --   GLUCOSE 121* 92  --   BUN 27* 24*  --   CREATININE 0.86 0.88  --   CALCIUM 9.1 9.0  --   MG  --   --  1.7    Liver Function Tests:  Recent Labs Lab 10/29/15 0735  AST 22  ALT 22  ALKPHOS 74  BILITOT 1.0  PROT 6.9  ALBUMIN 4.0    CBC:  Recent Labs Lab 10/29/15 0735  WBC 8.3  HGB 13.4  HCT 40.9  MCV 89.5  PLT 284    Cardiac Enzymes:  Recent Labs Lab 10/29/15 0735  TROPONINI <0.03    BNP: No results for input(s): PROBNP in the last 8760 hours.  Coagulation:  Recent Labs Lab 10/29/15 0735  INR 1.71    ECG:   Medications:   Scheduled Medications: . aspirin EC  81 mg Oral Daily  . furosemide  20 mg Intravenous BID  . levothyroxine  112 mcg Oral  QAC breakfast  . magnesium sulfate 1 - 4 g bolus IVPB  2 g Intravenous Once  . metoprolol tartrate  25 mg Oral Q6H  . potassium chloride  40 mEq Oral BID  . rivaroxaban  20 mg Oral Q supper  . sodium chloride flush  3 mL Intravenous Q12H     Infusions:     PRN Medications:  sodium chloride, acetaminophen, hydrALAZINE, nitroGLYCERIN, ondansetron (ZOFRAN) IV, sodium chloride flush     Assessment/Plan    1. Afib with RVR - has failed amiodarone loading with prior DCCV recently, back in afib with RVR - being considered for ablation procedure as outpatient, she has not committed however.  - Of note had some limitation with av nodal agents during last admission due to soft bp's.  - continue anticoag, CHADS2Vasc score of 3 - loaded with IV digoxin yesterday, started on lopressor 25mg  po q6hrs.  Her early AM dose of lopressor was held due to low heart rates.  - consolidate back to long acting Toprol once titration done in hospital  - long term her best option likely is ablation, family reports that have scheduled the procedure with Dr [name] for later in the month.   2. Acute on chronic systolic HF - exacerbation possibly related to afib with uncontrolled rates - NICM, possible tachycardia mediated CM - negative 1.6 liters yesterday, she is on lasix IV 20mg  bid currently. She did receive 40mg  IV x 2 doses yesterday. Renal function remains stable.    3. Abdominal pain - has feeling of abdominal fullness and discomfort, may be related to her CHF. Follow as she diuresis.    Would continue to monitor heart rates and IV diuresis today, potential discharge tomorrow  Carlyle Dolly, M.D.

## 2015-10-30 NOTE — Telephone Encounter (Signed)
  TEE and afib ablation scheduled for 11/19/15 Patient to be at the hospital at 6:30am  Labs 11/08/15   Daughter aware of dates and times and I gave her instructions

## 2015-10-30 NOTE — Care Management Note (Signed)
Case Management Note  Patient Details  Name: [name] Fry MRN: [id number] Date of Birth: 12-29-1945  Subjective/Objective:                  Admitted with A-fib with RVR. Pt is from home and is ind with ADL's. Pt has no HH services of DME. She has a-fib chronically and is on anticoagulation prior to admission. She has PCP, drives to appointments and has no difficulty affording medications. She plans to return home with self care.   Action/Plan: No CM needs anticipated.   Expected Discharge Date:     10/31/2015             Expected Discharge Plan:  Home/Self Care  In-House Referral:  NA  Discharge planning Services  CM Consult  Post Acute Care Choice:  NA Choice offered to:  NA  DME Arranged:    DME Agency:     HH Arranged:    HH Agency:     Status of Service:  Completed, signed off  If discussed at [employer] of Stay Meetings, dates discussed:    Additional Comments:  Sherald Barge, RN 10/30/2015, 4:07 PM

## 2015-10-31 LAB — BASIC METABOLIC PANEL
ANION GAP: 8 (ref 5–15)
BUN: 24 mg/dL — ABNORMAL HIGH (ref 6–20)
CHLORIDE: 104 mmol/L (ref 101–111)
CO2: 28 mmol/L (ref 22–32)
Calcium: 9.1 mg/dL (ref 8.9–10.3)
Creatinine, Ser: 0.9 mg/dL (ref 0.44–1.00)
GFR calc Af Amer: >60 mL/min (ref >60)
Glucose, Bld: 95 mg/dL (ref 65–99)
POTASSIUM: 4.2 mmol/L (ref 3.5–5.1)
SODIUM: 140 mmol/L (ref 135–145)

## 2015-10-31 MED ORDER — ASPIRIN 81 MG PO TBEC: 81.0000 mg | DELAYED_RELEASE_TABLET | Freq: Every day | ORAL | 0 refills | Status: DC

## 2015-10-31 MED ORDER — METOPROLOL SUCCINATE ER 50 MG PO TB24
75.0000 mg | ORAL_TABLET | Freq: Every day | ORAL | Status: DC
  Administered 2015-10-31: 75 mg via ORAL
  Filled 2015-10-31: qty 1

## 2015-10-31 MED ORDER — METOPROLOL SUCCINATE ER 25 MG PO TB24: 75.0000 mg | ORAL_TABLET | Freq: Every day | ORAL | 0 refills | Status: DC

## 2015-10-31 MED ORDER — DIGOXIN 125 MCG PO TABS
0.1250 mg | ORAL_TABLET | Freq: Every day | ORAL | Status: DC
  Administered 2015-10-31: 0.125 mg via ORAL
  Filled 2015-10-31: qty 1

## 2015-10-31 MED ORDER — METOPROLOL SUCCINATE ER 50 MG PO TB24: 75.0000 mg | ORAL_TABLET | Freq: Every day | ORAL | Status: DC

## 2015-10-31 MED ORDER — DIGOXIN 125 MCG PO TABS: 0.1250 mg | ORAL_TABLET | Freq: Every day | ORAL | 0 refills | Status: DC

## 2015-10-31 NOTE — Progress Notes (Signed)
Subjective:    No complaints this AM  Objective:   Temp:  [96.8 F (36 C)-98.5 F (36.9 C)] 97.9 F (36.6 C) (08/17 0400) Pulse Rate:  [53-112] 66 (08/17 0700) Resp:  [7-28] 12 (08/17 0700) BP: (107-138)/(64-120) 128/69 (08/17 0700) SpO2:  [93 %-100 %] 94 % (08/17 0700) Weight:  [192 lb 3.9 oz (87.2 kg)] 192 lb 3.9 oz (87.2 kg) (08/17 0500) Last BM Date: 10/31/15  Filed Weights   10/29/15 1151 10/30/15 0500 10/31/15 0500  Weight: 195 lb 8.8 oz (88.7 kg) 195 lb 12.3 oz (88.8 kg) 192 lb 3.9 oz (87.2 kg)    Intake/Output Summary (Last 24 hours) at 10/31/15 0802 Last data filed at 10/30/15 1828  Gross per 24 hour  Intake              720 ml  Output              950 ml  Net             -230 ml    Telemetry: afib variable rates, average 80s-100s  Exam:  General: NAD  HEENT: sclera clear, throat clear  Resp: CTAB  Cardiac: irreg, no m/r/g, no jvd  GI: abdomen soft, NT, ND  MSK: no LE edema  Neuro: no focal deficits  Psych: appropriate affect  Lab Results:  Basic Metabolic Panel:  Recent Labs Lab 10/29/15 0735 10/30/15 0415 10/30/15 0830 10/31/15 0427  NA 139 140  --  140  K 3.8 3.0*  --  4.2  CL 107 103  --  104  CO2 25 28  --  28  GLUCOSE 121* 92  --  95  BUN 27* 24*  --  24*  CREATININE 0.86 0.88  --  0.90  CALCIUM 9.1 9.0  --  9.1  MG  --   --  1.7  --     Liver Function Tests:  Recent Labs Lab 10/29/15 0735  AST 22  ALT 22  ALKPHOS 74  BILITOT 1.0  PROT 6.9  ALBUMIN 4.0    CBC:  Recent Labs Lab 10/29/15 0735  WBC 8.3  HGB 13.4  HCT 40.9  MCV 89.5  PLT 284    Cardiac Enzymes:  Recent Labs Lab 10/29/15 0735  TROPONINI <0.03    BNP: No results for input(s): PROBNP in the last 8760 hours.  Coagulation:  Recent Labs Lab 10/29/15 0735  INR 1.71    ECG:   Medications:   Scheduled Medications: . aspirin EC  81 mg Oral Daily  . digoxin  0.125 mg Oral Daily  . furosemide  20 mg Intravenous BID  .  levothyroxine  112 mcg Oral QAC breakfast  . metoprolol tartrate  25 mg Oral Q6H  . rivaroxaban  20 mg Oral Q supper  . sodium chloride flush  3 mL Intravenous Q12H    Infusions:    PRN Medications: sodium chloride, acetaminophen, hydrALAZINE, nitroGLYCERIN, ondansetron (ZOFRAN) IV, sodium chloride flush     Assessment/Plan    1. Afib with RVR - has failed amiodarone loading with prior DCCV recently, back in afib with RVR - being considered for ablation procedure as outpatient, she has not committed however.  - Of note had some limitation with av nodal agents during last admission due to soft bp's.  - continue anticoag, CHADS2Vasc score of 3 - loaded with IV digoxin, started on lopressor 25mg  po q6hrs. Two of her lopressor doses held yesterday for  - consolidate back to  long acting Toprol today, increase to 75mg  daily.  - long term her best option likely is ablation, family reports that have scheduled the procedure with Dr [name] for later in the month.   - asked to give her oral dig and Toprol XL this AM. Follow rates into the afternoon, if reasonable controlled around 110 or less would be ok for discharge.   2. Acute on chronic systolic HF - exacerbation possibly related to afib with uncontrolled rates - NICM, possible tachycardia mediated CM - I/Os are incomplete, she is on lasix IV 20mg  bid currently. She did receive 40mg  IV x 2 doses yesterday. Renal function mild uptrend in Cr.  - stop IV lasix today (she  Received her AM dose), resume her home oral regimen at discharge. Only taking prn at home 1-2 times a week, encouraged to have a lower threshold to take at home as needed based on weight and symptoms.     Possible discharge later today pending heart rates.     Carlyle Dolly, M.D.

## 2015-10-31 NOTE — Progress Notes (Signed)
Removed all IV access for discharge to home. Patient dressed herself and was taken down to car by wheelchair. All Paperwork and prescriptions given to patient.

## 2015-10-31 NOTE — Discharge Summary (Signed)
Physician Discharge Summary  [name] Fry [id number] DOB: 1945-09-18 DOA: 10/29/2015  PCP: [name], MD  Admit date: 10/29/2015 Discharge date: 10/31/2015  Admitted From: Home Disposition:  Home  Recommendations for Outpatient Follow-up:  1. Follow up with PCP in 1-2 weeks 2. Follow up with Dr. [name] as scheduled   Discharge Condition:Stable CODE STATUS:Full Diet recommendation: Heart healthy   Brief/Interim Summary: 70 y.o.femalewith h/o a fib, systolic CHF with NICM who has been having epigastric and RUQ abdominal pain for 2 days, also has been SOB. She comes in today for evaluation and is found to be in a fib with RVR with rates in the 140s. Started on cardizem drip. Seen by cardiology how has discontinued cardizem and started on digoxin. CXR shows pulmonary edema. Admission requested for further evaluation and management.  1. Atrial fibrillation with rapid ventricular rate 1. Initially a car Cardizem drip on admission 2. Continues her Xarelto for secondary stroke prevention. 3. Cardiology was consulted. 4. Patient transitioned to digoxin per Cardiology 5. Heart rate now better controlled 6. Discussed case with cardiology, recommendations to continue digoxin as tolerated 7. Toprol changed to 75mg  daily 2. Hyperlipidemia 1. Not on statin  3. Hypothyroidism 1. Continue levothyroxine as tolerated  4. Nonischemic cardiomyopathy 1. Appear stable at this time  5. Acute on chronic systolic congestive heart failure 1.  continue twice a day Lasix as tolerated  Discharge Diagnoses:  Principal Problem:   Atrial fibrillation with RVR ([name] Fry) Active Problems:   Hyperlipidemia   Hypothyroidism   Nonischemic cardiomyopathy ([name] Fry)   Acute on chronic systolic congestive heart failure ([name] Fry)    Discharge Instructions     Medication List    TAKE these medications   aspirin 81 MG EC tablet Take 1 tablet (81 mg total) by mouth daily.   digoxin 0.125 MG tablet Commonly  known as:  LANOXIN Take 1 tablet (0.125 mg total) by mouth daily.   furosemide 40 MG tablet Commonly known as:  LASIX Take 1 tablet (40 mg total) by mouth daily as needed for fluid (or weight gain).   levothyroxine 112 MCG tablet Commonly known as:  SYNTHROID, LEVOTHROID Take 112 mcg by mouth daily.   metoprolol succinate 25 MG 24 hr tablet Commonly known as:  TOPROL-XL Take 3 tablets (75 mg total) by mouth daily. What changed:  medication strength  how much to take  when to take this   NITROSTAT 0.4 MG SL tablet Generic drug:  nitroGLYCERIN PLACE 1 TABLET UNDER THE TONGUE EVERY 5 MINUTES FOR 3 DOSES AS NEEDED CHEST PAIN   OCUVITE PRESERVISION PO Take 2 tablets by mouth daily.   rivaroxaban 20 MG Tabs tablet Commonly known as:  XARELTO Take 1 tablet (20 mg total) by mouth daily with supper.   Vitamin D (Cholecalciferol) 1000 units Caps Take 1 capsule by mouth 2 (two) times daily.      Follow-up Information    [name], MD. Schedule an appointment as soon as possible for a visit in 2 week(s).   Specialty:  Family Medicine Contact information: [place] [address] [phone]        Rozann Lesches, MD Follow up on 11/15/2015.   Specialty:  Cardiology Why:  2;20 pm  Contact information: [place] [address] [phone]          Allergies  Allergen Reactions  . Doxycycline Rash    Consultations:  Cardiology  Procedures/Studies: Dg Chest Portable 1 View  Result  Date: 10/29/2015 CLINICAL DATA:  Chest pain for 1 week. Shortness of breath. History of breast carcinoma EXAM: PORTABLE CHEST 1 VIEW COMPARISON:  Aug 10, 2015 FINDINGS: There are small pleural effusions bilaterally. There is slight interstitial edema in the mid lower lung zones. There is no airspace consolidation. Heart is mildly enlarged with mild pulmonary venous hypertension. No adenopathy evident. There are surgical clips in the left axilla. There are  foci of calcification in the aortic arch. IMPRESSION: Findings most indicative of a degree of underlying congestive heart failure. No airspace consolidation or demonstrable adenopathy. There is aortic atherosclerosis. Clips noted in left axillary region. Electronically Signed   By: Lowella Grip III M.D.   On: 10/29/2015 07:47    Subjective: Eager to go home  Discharge Exam: Vitals:   10/31/15 1200 10/31/15 1300  BP: (!) 120/108 115/70  Pulse: (!) 50 65  Resp: (!) 21 16  Temp:     Vitals:   10/31/15 1000 10/31/15 1100 10/31/15 1200 10/31/15 1300  BP: 105/75 (!) 121/59 (!) 120/108 115/70  Pulse: (!) 53 61 (!) 50 65  Resp: 18 18 (!) 21 16  Temp:      TempSrc:      SpO2: 98% 98% 98% 97%  Weight:      Height:        General: Pt is alert, awake, not in acute distress Cardiovascular: Irregularly irregular, S1/S2 +, no rubs, no gallops Respiratory: CTA bilaterally, no wheezing, no rhonchi Abdominal: Soft, NT, ND, bowel sounds + Extremities: no edema, no cyanosis   The results of significant diagnostics from this hospitalization (including imaging, microbiology, ancillary and laboratory) are listed below for reference.     Microbiology: Recent Results (from the past 240 hour(s))  MRSA PCR Screening     Status: None   Collection Time: 10/29/15 11:14 AM  Result Value Ref Range Status   MRSA by PCR NEGATIVE NEGATIVE Final    Comment:        The GeneXpert MRSA Assay (FDA approved for NASAL specimens only), is one component of a comprehensive MRSA colonization surveillance program. It is not intended to diagnose MRSA infection nor to guide or monitor treatment for MRSA infections.      Labs: BNP (last 3 results)  Recent Labs  10/29/15 0735  BNP 123XX123*   Basic Metabolic Panel:  Recent Labs Lab 10/29/15 0735 10/30/15 0415 10/30/15 0830 10/31/15 0427  NA 139 140  --  140  K 3.8 3.0*  --  4.2  CL 107 103  --  104  CO2 25 28  --  28  GLUCOSE 121* 92  --   95  BUN 27* 24*  --  24*  CREATININE 0.86 0.88  --  0.90  CALCIUM 9.1 9.0  --  9.1  MG  --   --  1.7  --    Liver Function Tests:  Recent Labs Lab 10/29/15 0735  AST 22  ALT 22  ALKPHOS 74  BILITOT 1.0  PROT 6.9  ALBUMIN 4.0    Recent Labs Lab 10/29/15 0735  LIPASE 20   No results for input(s): AMMONIA in the last 168 hours. CBC:  Recent Labs Lab 10/29/15 0735  WBC 8.3  NEUTROABS 5.6  HGB 13.4  HCT 40.9  MCV 89.5  PLT 284   Cardiac Enzymes:  Recent Labs Lab 10/29/15 0735  TROPONINI <0.03   BNP: Invalid input(s): POCBNP CBG: No results for input(s): GLUCAP in the last 168 hours. D-Dimer No  results for input(s): DDIMER in the last 72 hours. Hgb A1c No results for input(s): HGBA1C in the last 72 hours. Lipid Profile No results for input(s): CHOL, HDL, LDLCALC, TRIG, CHOLHDL, LDLDIRECT in the last 72 hours. Thyroid function studies  Recent Labs  10/29/15 0735  TSH 5.133*   Anemia work up No results for input(s): VITAMINB12, FOLATE, FERRITIN, TIBC, IRON, RETICCTPCT in the last 72 hours. Urinalysis    Component Value Date/Time   COLORURINE YELLOW 10/29/2015 0813   APPEARANCEUR CLEAR 10/29/2015 0813   LABSPEC 1.010 10/29/2015 0813   PHURINE 6.0 10/29/2015 0813   GLUCOSEU NEGATIVE 10/29/2015 0813   HGBUR NEGATIVE 10/29/2015 0813   BILIRUBINUR NEGATIVE 10/29/2015 0813   KETONESUR NEGATIVE 10/29/2015 0813   PROTEINUR TRACE (A) 10/29/2015 0813   NITRITE NEGATIVE 10/29/2015 0813   LEUKOCYTESUR NEGATIVE 10/29/2015 0813   Sepsis Labs Invalid input(s): PROCALCITONIN,  WBC,  LACTICIDVEN Microbiology Recent Results (from the past 240 hour(s))  MRSA PCR Screening     Status: None   Collection Time: 10/29/15 11:14 AM  Result Value Ref Range Status   MRSA by PCR NEGATIVE NEGATIVE Final    Comment:        The GeneXpert MRSA Assay (FDA approved for NASAL specimens only), is one component of a comprehensive MRSA colonization surveillance program. It  is not intended to diagnose MRSA infection nor to guide or monitor treatment for MRSA infections.      SIGNED:   Donne Hazel, MD  [facility] 10/31/2015, 1:31 PM  If 7PM-7AM, please contact night-coverage [email or web address] Password [email or web address]

## 2015-11-08 ENCOUNTER — Other Ambulatory Visit: Payer: Medicare Other | Admitting: *Deleted

## 2015-11-08 DIAGNOSIS — I480 Paroxysmal atrial fibrillation: Secondary | ICD-10-CM

## 2015-11-08 LAB — CBC WITH DIFFERENTIAL/PLATELET
BASOS PCT: 1 %
Basophils Absolute: 77 cells/uL (ref 0–200)
Eosinophils Absolute: 231 cells/uL (ref 15–500)
Eosinophils Relative: 3 %
HEMATOCRIT: 39.9 % (ref 35.0–45.0)
HEMOGLOBIN: 13.3 g/dL (ref 11.7–15.5)
LYMPHS ABS: 2387 {cells}/uL (ref 850–3900)
Lymphocytes Relative: 31 %
MCH: 29.1 pg (ref 27.0–33.0)
MCHC: 33.3 g/dL (ref 32.0–36.0)
MCV: 87.3 fL (ref 80.0–100.0)
MONO ABS: 847 {cells}/uL (ref 200–950)
MPV: 9.7 fL (ref 7.5–12.5)
Monocytes Relative: 11 %
Neutro Abs: 4158 cells/uL (ref 1500–7800)
Neutrophils Relative %: 54 %
Platelets: 256 10*3/uL (ref 140–400)
RBC: 4.57 MIL/uL (ref 3.80–5.10)
RDW: 14.8 % (ref 11.0–15.0)
WBC: 7.7 10*3/uL (ref 3.8–10.8)

## 2015-11-08 LAB — BASIC METABOLIC PANEL
BUN: 14 mg/dL (ref 7–25)
CHLORIDE: 105 mmol/L (ref 98–110)
CO2: 22 mmol/L (ref 20–31)
Calcium: 10 mg/dL (ref 8.6–10.4)
Creat: 0.8 mg/dL (ref 0.60–0.93)
Glucose, Bld: 112 mg/dL — ABNORMAL HIGH (ref 65–99)
Potassium: 3.9 mmol/L (ref 3.5–5.3)
SODIUM: 142 mmol/L (ref 135–146)

## 2015-11-14 NOTE — Progress Notes (Signed)
Cardiology Office Note  Date: 11/15/2015   ID: [name] Fry, DOB March 05, 1946, MRN [id number]  PCP: [name], MD  Primary Cardiologist: Rozann Lesches, MD   Chief Complaint  Patient presents with  . Atrial Fibrillation  . Hospitalization Follow-up    History of Present Illness: [name] Fry is a 70 y.o. female last seen in July. At that time I referred her for EP consultation regarding treatment options for recurrent atrial fibrillation/flutter despite antiarrhythmic therapy and cardioversion attempts. She was seen by Dr. [name] in the interim. Also admitted to [facility] in August with rapid atrial fibrillation requiring medication adjustments - she was seen by Dr. Harl Bowie in consultation. Discharge regimen included Lanoxin 0.125 mg daily and Toprol-XL 75 mg daily.  She is here today with a granddaughter. States that she is doing reasonably well. She is scheduled to undergo TEE and atrial fibrillation ablation next week. I reviewed her medications, she reports compliance, takes her rate control medications in the morning and Xarelto in the evening. She states that she has not missed any Xarelto doses.  Past Medical History:  Diagnosis Date  . Arthritis   . Atrial fibrillation [facility])    Documented January 2017  . Breast cancer ([place])    Left  . Cataracts, bilateral   . Coronary atherosclerosis of native coronary artery    DES OM 3/12  . Essential hypertension, benign   . GERD (gastroesophageal reflux disease)   . H/O hiatal hernia   . Hyperlipidemia   . Hypothyroidism     Past Surgical History:  Procedure Laterality Date  . [place] VITRECTOMY WITH 20 GAUGE MVR PORT Right 09/29/2012   Procedure: 25 GAUGE PARS PLANA VITRECTOMY WITH 20 GAUGE MVR PORT;  Surgeon: Hayden Pedro, MD;  Location: [place];  Service: Ophthalmology;  Laterality: Right;  . APPENDECTOMY    . CARDIAC CATHETERIZATION N/A 07/26/2015   Procedure: Left Heart Cath and Coronary Angiography;  Surgeon:  [name], MD;  Location: [place] CV LAB;  Service: Cardiovascular;  Laterality: N/A;  . CARDIOVERSION N/A 08/30/2015   Procedure: CARDIOVERSION;  Surgeon: Satira Sark, MD;  [facility];  Service: Cardiovascular;  Laterality: N/A;  . CATARACT EXTRACTION W/PHACO Right 05/18/2013   Procedure: CATARACT EXTRACTION PHACO AND INTRAOCULAR LENS PLACEMENT RIGHT EYE;  Surgeon: Tonny Branch, MD;  Location: [facility] OR[facility];  Service: Ophthalmology;  Laterality: Right;  CDE:13.35  . CHOLECYSTECTOMY    . GAS/FLUID EXCHANGE Right 09/29/2012   Procedure: GAS/FLUID EXCHANGE;  Surgeon: Hayden Pedro, MD;  Location: [place];  Service: Ophthalmology;  Laterality: Right;  . LASER PHOTO ABLATION Right 09/29/2012   Procedure: LASER PHOTO ABLATION;  Surgeon: Hayden Pedro, MD;  Location: [place];  Service: Ophthalmology;  Laterality: Right;  Headscope laser  . MASTECTOMY Left    1997; S/P chemotherapy/radiation  . MEMBRANE PEEL Right 09/29/2012   Procedure: MEMBRANE PEEL;  Surgeon: Hayden Pedro, MD;  Location: [place];  Service: Ophthalmology;  Laterality: Right;  . TUBAL LIGATION      Current Outpatient Prescriptions  Medication Sig Dispense Refill  . digoxin (LANOXIN) 0.125 MG tablet Take 1 tablet (0.125 mg total) by mouth daily. 30 tablet 0  . furosemide (LASIX) 40 MG tablet Take 1 tablet (40 mg total) by mouth daily as needed for fluid (or weight gain). 30 tablet 2  . levothyroxine (SYNTHROID, LEVOTHROID) 112 MCG tablet Take 112 mcg by mouth daily.     . metoprolol  succinate (TOPROL-XL) 25 MG 24 hr tablet Take 3 tablets (75 mg total) by mouth daily. 90 tablet 0  . Multiple Vitamins-Minerals (OCUVITE PRESERVISION PO) Take 2 tablets by mouth daily.    Marland Kitchen NITROSTAT 0.4 MG SL tablet PLACE 1 TABLET UNDER THE TONGUE EVERY 5 MINUTES FOR 3 DOSES AS NEEDED CHEST PAIN 25 tablet 3  . rivaroxaban (XARELTO) 20 MG TABS tablet Take 1 tablet (20 mg total) by mouth daily with supper. 28 tablet 0  . Vitamin D,  Cholecalciferol, 1000 units CAPS Take 1 capsule by mouth 2 (two) times daily.    Marland Kitchen aspirin EC 81 MG EC tablet Take 1 tablet (81 mg total) by mouth daily. 30 tablet 0   No current facility-administered medications for this visit.    Allergies:  Doxycycline   Social History: The patient  reports that she has never smoked. She has never used smokeless tobacco. She reports that she does not drink alcohol or use drugs.   ROS:  Please see the history of present illness. Otherwise, complete review of systems is positive for chronic fatigue.  All other systems are reviewed and negative.   Physical Exam: VS:  BP (!) 152/88   Pulse 97   Ht 5\' 3"  (1.6 m)   Wt 197 lb (89.4 kg)   LMP 09/28/2012   SpO2 98%   BMI 34.90 kg/m , BMI Body mass index is 34.9 kg/m.  Wt Readings from Last 3 Encounters:  11/15/15 197 lb (89.4 kg)  10/31/15 192 lb 3.9 oz (87.2 kg)  09/25/15 198 lb 3.2 oz (89.9 kg)    Overweight woman, no distress.Marland Kitchen  HEENT: Conjunctiva and lids normal, oropharynx with moist mucosa.  Neck: Supple, no elevated JVP or bruits.  Lungs: Clear to auscultation, nonlabored.  Cardiac: Irregularly irregular, no S3.  Abdomen: Soft, nontender, bowel sounds present.  Skin: Warm and dry.  Extremities: Mild leg edema.  ECG: I personally reviewed the tracing from 08/19/2015 which showed atrial flutter with 2:1 block and left bundle branch block.  Recent Labwork: 10/29/2015: ALT 22; AST 22; B Natriuretic Peptide 1,555.0; TSH 5.133 10/30/2015: Magnesium 1.7 11/08/2015: BUN 14; Creat 0.80; Hemoglobin 13.3; Platelets 256; Potassium 3.9; Sodium 142   Other Studies Reviewed Today:  Echocardiogram 08/11/2015: Study Conclusions  - Left ventricle: The cavity size was normal. Wall thickness was  increased in a pattern of moderate LVH. Systolic function was  moderately reduced. The estimated ejection fraction was in the  range of 35% to 40%. Global hypokinesis with inferior akinesis,   incoordinate septal motion. The study is not technically  sufficient to allow evaluation of LV diastolic function. Ejection  fraction (MOD, 2-plane): 40%. - Aortic valve: Trileaflet. Sclerosis without stenosis. There was  trivial regurgitation. - Mitral valve: Calcified annulus. Mildly thickened leaflets .  There was moderate regurgitation. - Left atrium: The atrium was normal in size. - Right ventricle: The cavity size was normal. - Right atrium: The atrium was mildly dilated. - Tricuspid valve: There was moderate regurgitation. - Pulmonary arteries: PA peak pressure: 30 mm Hg (S) + RAP. - Systemic veins: Not visualized. - Pericardium, extracardiac: A trivial pericardial effusion was  identified posterior to the heart. There was a left pleural  effusion.  Impressions:  - LVEF 35-40%, moderate LVH, global hypokinesis with inferior  akinesis and incoordinate septal motion, aortic valve sclerosis  with trivial AI, mitral annular calcification with moderate MR,  moderate TR, RVSP 30 mmHG + right atrial pressure, IVC not  visualized, trivial  posterior pericardial effusion and left  pleural effusion.  Cardiac catheterization 07/26/2015:  Prox LAD lesion, 30% stenosed.  The left ventricular systolic function is normal.  1. Single-vessel coronary artery disease with wide patency of the stented segment in the left circumflex 2. Mild coronary irregularity and calcification with no evidence of obstruction in the left main, LAD, or right coronary artery 3. Normal LV systolic function  Assessment and Plan:  1. Persistent atrial fibrillation, heart rate control looks to be better on current regimen including Toprol-XL 75 mg daily and Lanoxin 0.125 mg daily. She reports compliance with Xarelto. She is scheduled to undergo TEE with atrial fibrillation ablation next week per Dr. [name].  2. Essential hypertension. No changes made today. Continue to monitor with Dr. Nadara Mustard.  3.  Discussed workup for sleep apnea, she has been somewhat hesitant to consider formal sleep study, but we discussed this again. May help control of atrial fibrillation and her high blood pressure.  Current medicines were reviewed with the patient today.  Disposition: Follow-up with me in 6 weeks.  Signed, Satira Sark, MD, [facility] 11/15/2015 2:55 PM    [facility] [address], [place], [place] [address] Phone: [phone]; Fax: [phone]

## 2015-11-15 ENCOUNTER — Ambulatory Visit (INDEPENDENT_AMBULATORY_CARE_PROVIDER_SITE_OTHER): Payer: Medicare Other | Admitting: Cardiology

## 2015-11-15 ENCOUNTER — Encounter: Payer: Self-pay | Admitting: Cardiology

## 2015-11-15 VITALS — BP 152/88 | HR 97 | Ht 63.0 in | Wt 197.0 lb

## 2015-11-15 DIAGNOSIS — I10 Essential (primary) hypertension: Secondary | ICD-10-CM

## 2015-11-15 DIAGNOSIS — I481 Persistent atrial fibrillation: Secondary | ICD-10-CM

## 2015-11-15 DIAGNOSIS — I4819 Other persistent atrial fibrillation: Secondary | ICD-10-CM

## 2015-11-15 NOTE — Patient Instructions (Signed)
Medication Instructions:  Continue all current medications.  Labwork: none  Testing/Procedures: none  Follow-Up: 6 weeks   Any Other Special Instructions Will Be Listed Below (If Applicable).  If you need a refill on your cardiac medications before your next appointment, please call your pharmacy.  

## 2015-11-19 ENCOUNTER — Ambulatory Visit (HOSPITAL_BASED_OUTPATIENT_CLINIC_OR_DEPARTMENT_OTHER): Payer: Medicare Other

## 2015-11-19 ENCOUNTER — Encounter (HOSPITAL_COMMUNITY)
Admission: RE | Disposition: A | Discharge status: Home / Self Care | Payer: Self-pay | Source: Ambulatory Visit | Attending: Cardiovascular Disease

## 2015-11-19 ENCOUNTER — Ambulatory Visit (HOSPITAL_COMMUNITY): Payer: Medicare Other | Admitting: Critical Care Medicine

## 2015-11-19 ENCOUNTER — Ambulatory Visit (HOSPITAL_COMMUNITY)
Admission: RE | Admit: 2015-11-19 | Discharge: 2015-11-20 | Disposition: A | Discharge status: Home / Self Care | Payer: Medicare Other | Source: Ambulatory Visit | Attending: Cardiovascular Disease | Admitting: Cardiovascular Disease

## 2015-11-19 ENCOUNTER — Encounter (HOSPITAL_COMMUNITY): Payer: Self-pay

## 2015-11-19 DIAGNOSIS — Z79899 Other long term (current) drug therapy: Secondary | ICD-10-CM | POA: Diagnosis not present

## 2015-11-19 DIAGNOSIS — Z9221 Personal history of antineoplastic chemotherapy: Secondary | ICD-10-CM | POA: Diagnosis not present

## 2015-11-19 DIAGNOSIS — E039 Hypothyroidism, unspecified: Secondary | ICD-10-CM | POA: Insufficient documentation

## 2015-11-19 DIAGNOSIS — Z7901 Long term (current) use of anticoagulants: Secondary | ICD-10-CM | POA: Diagnosis not present

## 2015-11-19 DIAGNOSIS — I5022 Chronic systolic (congestive) heart failure: Secondary | ICD-10-CM | POA: Diagnosis not present

## 2015-11-19 DIAGNOSIS — Z923 Personal history of irradiation: Secondary | ICD-10-CM | POA: Insufficient documentation

## 2015-11-19 DIAGNOSIS — I2510 Atherosclerotic heart disease of native coronary artery without angina pectoris: Secondary | ICD-10-CM | POA: Diagnosis not present

## 2015-11-19 DIAGNOSIS — Z853 Personal history of malignant neoplasm of breast: Secondary | ICD-10-CM | POA: Diagnosis not present

## 2015-11-19 DIAGNOSIS — I481 Persistent atrial fibrillation: Secondary | ICD-10-CM | POA: Diagnosis not present

## 2015-11-19 DIAGNOSIS — E785 Hyperlipidemia, unspecified: Secondary | ICD-10-CM | POA: Insufficient documentation

## 2015-11-19 DIAGNOSIS — I110 Hypertensive heart disease with heart failure: Secondary | ICD-10-CM | POA: Insufficient documentation

## 2015-11-19 DIAGNOSIS — I429 Cardiomyopathy, unspecified: Secondary | ICD-10-CM | POA: Insufficient documentation

## 2015-11-19 DIAGNOSIS — Z9012 Acquired absence of left breast and nipple: Secondary | ICD-10-CM | POA: Insufficient documentation

## 2015-11-19 DIAGNOSIS — I4891 Unspecified atrial fibrillation: Secondary | ICD-10-CM | POA: Diagnosis present

## 2015-11-19 DIAGNOSIS — M1990 Unspecified osteoarthritis, unspecified site: Secondary | ICD-10-CM | POA: Diagnosis not present

## 2015-11-19 DIAGNOSIS — Y838 Other surgical procedures as the cause of abnormal reaction of the patient, or of later complication, without mention of misadventure at the time of the procedure: Secondary | ICD-10-CM | POA: Diagnosis not present

## 2015-11-19 DIAGNOSIS — L7632 Postprocedural hematoma of skin and subcutaneous tissue following other procedure: Secondary | ICD-10-CM | POA: Diagnosis not present

## 2015-11-19 DIAGNOSIS — I4819 Other persistent atrial fibrillation: Secondary | ICD-10-CM | POA: Diagnosis present

## 2015-11-19 DIAGNOSIS — K219 Gastro-esophageal reflux disease without esophagitis: Secondary | ICD-10-CM | POA: Diagnosis not present

## 2015-11-19 DIAGNOSIS — I340 Nonrheumatic mitral (valve) insufficiency: Secondary | ICD-10-CM | POA: Diagnosis not present

## 2015-11-19 DIAGNOSIS — I4892 Unspecified atrial flutter: Secondary | ICD-10-CM | POA: Diagnosis not present

## 2015-11-19 HISTORY — PX: ELECTROPHYSIOLOGIC STUDY: SHX172A

## 2015-11-19 HISTORY — PX: TEE WITHOUT CARDIOVERSION: SHX5443

## 2015-11-19 LAB — CBC
HCT: 32.9 % — ABNORMAL LOW (ref 36.0–46.0)
HEMOGLOBIN: 10.3 g/dL — AB (ref 12.0–15.0)
MCH: 28.1 pg (ref 26.0–34.0)
MCHC: 31.3 g/dL (ref 30.0–36.0)
MCV: 89.9 fL (ref 78.0–100.0)
PLATELETS: 196 10*3/uL (ref 150–400)
RBC: 3.66 MIL/uL — AB (ref 3.87–5.11)
RDW: 14.1 % (ref 11.5–15.5)
WBC: 8.7 10*3/uL (ref 4.0–10.5)

## 2015-11-19 LAB — BASIC METABOLIC PANEL
ANION GAP: 7 (ref 5–15)
BUN: 10 mg/dL (ref 6–20)
CHLORIDE: 109 mmol/L (ref 101–111)
CO2: 26 mmol/L (ref 22–32)
Calcium: 8.4 mg/dL — ABNORMAL LOW (ref 8.9–10.3)
Creatinine, Ser: 0.74 mg/dL (ref 0.44–1.00)
GFR calc Af Amer: >60 mL/min (ref >60)
GFR calc non Af Amer: >60 mL/min (ref >60)
GLUCOSE: 125 mg/dL — AB (ref 65–99)
POTASSIUM: 3.7 mmol/L (ref 3.5–5.1)
SODIUM: 142 mmol/L (ref 135–145)

## 2015-11-19 LAB — POCT ACTIVATED CLOTTING TIME
ACTIVATED CLOTTING TIME: 290 s
ACTIVATED CLOTTING TIME: 197 s
ACTIVATED CLOTTING TIME: 175 s
ACTIVATED CLOTTING TIME: 191 s
Activated Clotting Time: 279 seconds
Activated Clotting Time: 307 seconds

## 2015-11-19 SURGERY — ECHOCARDIOGRAM, TRANSESOPHAGEAL
Anesthesia: Moderate Sedation

## 2015-11-19 SURGERY — ATRIAL FIBRILLATION ABLATION
Anesthesia: Monitor Anesthesia Care

## 2015-11-19 MED ORDER — FENTANYL CITRATE (PF) 100 MCG/2ML IJ SOLN
12.5000 ug | Freq: Once | INTRAMUSCULAR | Status: AC
  Administered 2015-11-19 (×2): 12.5 ug via INTRAVENOUS

## 2015-11-19 MED ORDER — SODIUM CHLORIDE 0.9 % IV SOLN: INTRAVENOUS | Status: DC

## 2015-11-19 MED ORDER — LACTATED RINGERS IV SOLN
INTRAVENOUS | Status: DC | PRN
  Administered 2015-11-19: 11:00:00 via INTRAVENOUS

## 2015-11-19 MED ORDER — PROPOFOL 500 MG/50ML IV EMUL
INTRAVENOUS | Status: DC | PRN
  Administered 2015-11-19: 75 ug/kg/min via INTRAVENOUS
  Administered 2015-11-19: 12:00:00 via INTRAVENOUS

## 2015-11-19 MED ORDER — MIDAZOLAM HCL 5 MG/ML IJ SOLN
INTRAMUSCULAR | Status: AC
  Filled 2015-11-19: qty 2

## 2015-11-19 MED ORDER — FENTANYL CITRATE (PF) 100 MCG/2ML IJ SOLN
INTRAMUSCULAR | Status: DC | PRN
  Administered 2015-11-19: 25 ug via INTRAVENOUS
  Administered 2015-11-19 (×2): 12.5 ug via INTRAVENOUS
  Administered 2015-11-19 (×2): 25 ug via INTRAVENOUS

## 2015-11-19 MED ORDER — MIDAZOLAM HCL 10 MG/2ML IJ SOLN
INTRAMUSCULAR | Status: DC | PRN
  Administered 2015-11-19: 2 mg via INTRAVENOUS
  Administered 2015-11-19: 1 mg via INTRAVENOUS

## 2015-11-19 MED ORDER — ONDANSETRON HCL 4 MG/2ML IJ SOLN: 4.0000 mg | Freq: Four times a day (QID) | INTRAMUSCULAR | Status: DC | PRN

## 2015-11-19 MED ORDER — HEPARIN SODIUM (PORCINE) 1000 UNIT/ML IJ SOLN
INTRAMUSCULAR | Status: DC | PRN
  Administered 2015-11-19 (×2): 1000 [IU] via INTRAVENOUS
  Administered 2015-11-19: 12000 [IU]

## 2015-11-19 MED ORDER — SODIUM CHLORIDE 0.9% FLUSH: 3.0000 mL | INTRAVENOUS | Status: DC | PRN

## 2015-11-19 MED ORDER — HEPARIN SODIUM (PORCINE) 1000 UNIT/ML IJ SOLN
INTRAMUSCULAR | Status: AC
  Filled 2015-11-19: qty 1

## 2015-11-19 MED ORDER — BUTAMBEN-TETRACAINE-BENZOCAINE 2-2-14 % EX AERO
INHALATION_SPRAY | CUTANEOUS | Status: DC | PRN
  Administered 2015-11-19: 2 via TOPICAL

## 2015-11-19 MED ORDER — ACETAMINOPHEN 325 MG PO TABS: 650.0000 mg | ORAL_TABLET | ORAL | Status: DC | PRN

## 2015-11-19 MED ORDER — SODIUM CHLORIDE 0.9 % IV SOLN
INTRAVENOUS | Status: DC
  Administered 2015-11-19: 500 mL via INTRAVENOUS

## 2015-11-19 MED ORDER — LIDOCAINE 2% (20 MG/ML) 5 ML SYRINGE
INTRAMUSCULAR | Status: DC | PRN
  Administered 2015-11-19: 20 mg via INTRAVENOUS

## 2015-11-19 MED ORDER — RIVAROXABAN 20 MG PO TABS
20.0000 mg | ORAL_TABLET | Freq: Every day | ORAL | Status: DC
  Administered 2015-11-19: 20 mg via ORAL
  Filled 2015-11-19: qty 1

## 2015-11-19 MED ORDER — FENTANYL CITRATE (PF) 100 MCG/2ML IJ SOLN
INTRAMUSCULAR | Status: DC | PRN
  Administered 2015-11-19: 25 ug via INTRAVENOUS

## 2015-11-19 MED ORDER — METOPROLOL SUCCINATE ER 25 MG PO TB24
75.0000 mg | ORAL_TABLET | Freq: Every day | ORAL | Status: DC
  Administered 2015-11-20: 75 mg via ORAL
  Filled 2015-11-19: qty 1

## 2015-11-19 MED ORDER — IOPAMIDOL (ISOVUE-370) INJECTION 76%
INTRAVENOUS | Status: DC | PRN
  Administered 2015-11-19: 100 mL via INTRAVENOUS
  Administered 2015-11-19: 3 mL via INTRAVENOUS

## 2015-11-19 MED ORDER — ONDANSETRON HCL 4 MG/2ML IJ SOLN
INTRAMUSCULAR | Status: DC | PRN
  Administered 2015-11-19: 4 mg via INTRAVENOUS

## 2015-11-19 MED ORDER — FENTANYL CITRATE (PF) 100 MCG/2ML IJ SOLN
12.5000 ug | Freq: Once | INTRAMUSCULAR | Status: AC
  Administered 2015-11-19: 12.5 ug via INTRAVENOUS
  Filled 2015-11-19: qty 2

## 2015-11-19 MED ORDER — BUPIVACAINE HCL (PF) 0.25 % IJ SOLN
INTRAMUSCULAR | Status: DC | PRN
  Administered 2015-11-19: 15 mL

## 2015-11-19 MED ORDER — SODIUM CHLORIDE 0.9 % IV SOLN
INTRAVENOUS | Status: DC | PRN
  Administered 2015-11-19: 08:00:00 via INTRAVENOUS

## 2015-11-19 MED ORDER — IOPAMIDOL (ISOVUE-370) INJECTION 76%
INTRAVENOUS | Status: AC
  Filled 2015-11-19: qty 125

## 2015-11-19 MED ORDER — PROTAMINE SULFATE 10 MG/ML IV SOLN
INTRAVENOUS | Status: DC | PRN
  Administered 2015-11-19: 30 mg via INTRAVENOUS

## 2015-11-19 MED ORDER — SODIUM CHLORIDE 0.9 % IV SOLN: 250.0000 mL | INTRAVENOUS | Status: DC | PRN

## 2015-11-19 MED ORDER — HEPARIN SODIUM (PORCINE) 1000 UNIT/ML IJ SOLN
INTRAMUSCULAR | Status: DC | PRN
  Administered 2015-11-19 (×2): 2000 [IU] via INTRAVENOUS
  Administered 2015-11-19: 3000 [IU] via INTRAVENOUS

## 2015-11-19 MED ORDER — HYDROCODONE-ACETAMINOPHEN 5-325 MG PO TABS
1.0000 | ORAL_TABLET | ORAL | Status: DC | PRN
  Administered 2015-11-19 – 2015-11-20 (×2): 1 via ORAL
  Filled 2015-11-19 (×2): qty 1

## 2015-11-19 MED ORDER — SODIUM CHLORIDE 0.9% FLUSH
3.0000 mL | Freq: Two times a day (BID) | INTRAVENOUS | Status: DC
  Administered 2015-11-19: 3 mL via INTRAVENOUS

## 2015-11-19 MED ORDER — BUPIVACAINE HCL (PF) 0.25 % IJ SOLN
INTRAMUSCULAR | Status: AC
  Filled 2015-11-19: qty 30

## 2015-11-19 MED ORDER — FENTANYL CITRATE (PF) 100 MCG/2ML IJ SOLN
INTRAMUSCULAR | Status: AC
  Filled 2015-11-19: qty 2

## 2015-11-19 MED ORDER — LEVOTHYROXINE SODIUM 112 MCG PO TABS
112.0000 ug | ORAL_TABLET | Freq: Every day | ORAL | Status: DC
  Administered 2015-11-20: 112 ug via ORAL
  Filled 2015-11-19: qty 1

## 2015-11-19 MED ORDER — MIDAZOLAM HCL 5 MG/5ML IJ SOLN
INTRAMUSCULAR | Status: DC | PRN
  Administered 2015-11-19: 0.5 mg via INTRAVENOUS

## 2015-11-19 SURGICAL SUPPLY — 21 items
BAG SNAP BAND KOVER 36X36 (MISCELLANEOUS) ×3 IMPLANT
BLANKET WARM UNDERBOD FULL ACC (MISCELLANEOUS) ×3 IMPLANT
CATH DIAG 6FR PIGTAIL (CATHETERS) ×2 IMPLANT
CATH NAVISTAR SMARTTOUCH DF (ABLATOR) ×2 IMPLANT
CATH SOUNDSTAR 3D IMAGING (CATHETERS) ×2 IMPLANT
CATH VARIABLE LASSO NAV 2515 (CATHETERS) ×2 IMPLANT
CATH WEBSTER BI DIR CS D-F CRV (CATHETERS) ×2 IMPLANT
NDL TRANSEP BRK 71CM 407200 (NEEDLE) IMPLANT
NEEDLE TRANSEP BRK 71CM 407200 (NEEDLE) ×3 IMPLANT
PACK EP LATEX FREE (CUSTOM PROCEDURE TRAY) ×3
PACK EP LF (CUSTOM PROCEDURE TRAY) ×1 IMPLANT
PAD DEFIB LIFELINK (PAD) ×3 IMPLANT
PATCH CARTO3 (PAD) ×2 IMPLANT
SHEATH AVANTI 11F 11CM (SHEATH) ×2 IMPLANT
SHEATH PINNACLE 7F 10CM (SHEATH) ×4 IMPLANT
SHEATH PINNACLE 9F 10CM (SHEATH) ×2 IMPLANT
SHEATH SWARTZ TS SL2 63CM 8.5F (SHEATH) ×2 IMPLANT
SHIELD RADPAD SCOOP 12X17 (MISCELLANEOUS) ×3 IMPLANT
SYR MEDRAD MARK V 150ML (SYRINGE) ×3 IMPLANT
TUBING CONTRAST HIGH PRESS 48 (TUBING) ×2 IMPLANT
TUBING SMART ABLATE COOLFLOW (TUBING) ×2 IMPLANT

## 2015-11-19 NOTE — Discharge Summary (Signed)
ELECTROPHYSIOLOGY PROCEDURE DISCHARGE SUMMARY    Patient ID: [name] Fry,  MRN: [id number], DOB/AGE: 1945-09-18 70 y.o.  Admit date: 11/19/2015 Discharge date: 11/20/2015  Primary Care Physician: [name], MD Primary Cardiologist: Domenic Polite Electrophysiologist: Thompson Grayer, MD  Primary Discharge Diagnosis:  Persistent atrial fibrillation and atrial flutter status post ablation this admission  Secondary Discharge Diagnosis:  1.  CAD 2.  Hypertension 3.  Hyperlipidemia 4.  Hypothyroidism  Procedures This Admission:  1.  Electrophysiology study and radiofrequency catheter ablation on 11/19/15 by Dr Thompson Grayer.  This study demonstrated atrial fibrillation upon presentation; rotational angiography reveals a moderate to large sized left atrium; successful electrical isolation and anatomical encircling of all four pulmonary veins with radiofrequency current; additional mapping and ablation within the left atrium due to persistence of atrial fibrillation with a posterior wall box demonstrated; atrial fibrillation converted to typical atrial flutter with ablation; cavo-tricuspid isthmus ablation performed with complete bidirectional isthmus block achieved; no inducible arrhythmias following ablation; no early apparent complications..    Brief HPI: [name] Fry is a 70 y.o. female with a history of persistent atrial fibrillation.  They have failed medical therapy with amiodarone. Risks, benefits, and alternatives to catheter ablation of atrial fibrillation were reviewed with the patient who wished to proceed.  The patient underwent TEE prior to the procedure which demonstrated depressed LV function and no LAA thrombus.    Hospital Course:  The patient was admitted and underwent EPS/RFCA of atrial fibrillation with details as outlined above.  They were monitored on telemetry overnight which demonstrated sinus rhythm with nonsustained atach and occasional PACs.  She did develop a moderate sized  groin hematoma.  This remained stable at discharge. The patient was examined and considered to be stable for discharge.  Wound care and restrictions were reviewed with the patient.  The patient will be seen back by the AF clinic in 4 weeks and Dr [name] in 12 weeks for post ablation follow up.   This patients CHA2DS2-VASc Score and unadjusted Ischemic Stroke Rate (% per year) is equal to 4.8 % stroke rate/year from a score of 4 Above score calculated as 1 point each if present [CHF, HTN, DM, Vascular=MI/PAD/Aortic Plaque, Age if 65-74, or Female] Above score calculated as 2 points each if present [Age > 75, or Stroke/TIA/TE]   Physical Exam: Vitals:   11/20/15 0400 11/20/15 0450 11/20/15 0530 11/20/15 0600  BP: 124/61   104/61  Pulse: 65 70 63 66  Resp: 20 13 16 17   Temp:      TempSrc:      SpO2: 97% 100% 97% 92%  Weight:      Height:        GEN- The patient is well appearing, alert and oriented x 3 today.   HEENT: normocephalic, atraumatic; sclera clear, conjunctiva pink; hearing intact; oropharynx clear; neck supple  Lungs- Clear to ausculation bilaterally, normal work of breathing.  No wheezes, rales, rhonchi Heart- Regular rate and rhythm, no murmurs, rubs or gallops  GI- soft, non-tender, non-distended, bowel sounds present  Extremities- no clubbing, cyanosis, or edema; DP/PT/radial pulses 2+ bilaterally, groin without hematoma/bruit MS- no significant deformity or atrophy Skin- warm and dry, no rash or lesion Psych- euthymic mood, full affect Neuro- strength and sensation are intact   Labs:   Lab Results  Component Value Date   WBC 7.8 11/20/2015   HGB 9.6 (L) 11/20/2015   HCT 30.8 (L) 11/20/2015   MCV 89.8 11/20/2015   PLT 135 (  L) 11/20/2015     Recent Labs Lab 11/20/15 0203  NA 141  K 3.8  CL 109  CO2 23  BUN 12  CREATININE 0.84  CALCIUM 8.5*  GLUCOSE 115*     Discharge Medications:    Medication List    STOP taking these medications   aspirin 81  MG EC tablet     TAKE these medications   digoxin 0.125 MG tablet Commonly known as:  LANOXIN Take 1 tablet (0.125 mg total) by mouth daily.   furosemide 40 MG tablet Commonly known as:  LASIX Take 1 tablet (40 mg total) by mouth daily as needed for fluid (or weight gain).   levothyroxine 112 MCG tablet Commonly known as:  SYNTHROID, LEVOTHROID Take 112 mcg by mouth daily.   metoprolol succinate 25 MG 24 hr tablet Commonly known as:  TOPROL-XL Take 1 tablet (25 mg total) by mouth daily. What changed:  how much to take   NITROSTAT 0.4 MG SL tablet Generic drug:  nitroGLYCERIN PLACE 1 TABLET UNDER THE TONGUE EVERY 5 MINUTES FOR 3 DOSES AS NEEDED CHEST PAIN   OCUVITE PRESERVISION PO Take 2 tablets by mouth daily.   pantoprazole 40 MG tablet Commonly known as:  PROTONIX Take 1 tablet (40 mg total) by mouth daily.   rivaroxaban 20 MG Tabs tablet Commonly known as:  XARELTO Take 1 tablet (20 mg total) by mouth daily with supper.   Vitamin D (Cholecalciferol) 1000 units Caps Take 1 capsule by mouth 2 (two) times daily.       Disposition:   Follow-up Information    [facility] Follow up on 12/17/2015.   Specialty:  Cardiology Why:  at [facility] information: [address] [id number] [place] [id number] [phone]       Thompson Grayer, MD Follow up on 02/17/2016.   Specialty:  Cardiology Why:  at [facility] information: [place] [address] [phone]           Duration of Discharge Encounter: Greater than 30 minutes including physician time.  Army Fossa MD, [facility] 11/20/2015 6:41 AM

## 2015-11-19 NOTE — Progress Notes (Signed)
Patient came from cath lab noted firmness on right groin compared to left groin, pressure held with cath lab RN, pressure dressing applied, level 1, dr. [name] saw site. Orders to continue to monitor, site marked. Site now level 2 bigger and enlarged pass previous markings up towward abdmonal, pt complaining of back pain, pedal pulses 2+, BP 132/80, HR 60 NSR,, dr. [name] paged orders to give fentanyl 12.5 mcg and repeat again if needed, also paged fellow to come to bedside. 3 RNs at bedside holding pressure. Will continue to monitor closely.

## 2015-11-19 NOTE — CV Procedure (Signed)
    Transesophageal Echocardiogram Note  [name] Fry [id number] [birth year]  Procedure: Transesophageal Echocardiogram Indications: atrial fib   Procedure Details Consent: Obtained Time Out: Verified patient identification, verified procedure, site/side was marked, verified correct patient position, special equipment/implants available, Radiology Safety Procedures followed,  medications/allergies/relevent history reviewed, required imaging and test results available.  Performed  Medications:  During this procedure the patient is administered a total of Versed 3 mg and Fentanyl 25 mcg  to achieve and maintain moderate conscious sedation.  The patient's heart rate, blood pressure, and oxygen saturation are monitored continuously during the procedure. The period of conscious sedation is 30 minutes, of which I was present face-to-face 100% of this time.  Left Ventrical:  Moderate LV dysfunction,  EF around 30-35%  Mitral Valve: moderate MR   Aortic Valve: trace AI  Tricuspid Valve: mild - mod TR   Pulmonic Valve: trivial PI  Left Atrium/ Left atrial appendage: no thrombi   Atrial septum: no PFO by color doppler   Aorta: mild atherosclerosis    Complications: No apparent complications Patient did tolerate procedure well.   Thayer Headings, Brooke Bonito., MD, [facility] 11/19/2015, 8:33 AM

## 2015-11-19 NOTE — Progress Notes (Addendum)
Called due to hematoma at rt groin ablation site.  Hematoma of 5 cm.  Nurses have been holding pressure and is more soft than it was.  Pt has had some back pain and now with pain meds she is more stable.   Dr. [name] made aware.    Will check CBC and BMP.  If recurrent or drop in BP will check CT of pelvis and abd.  No hematoma in thigh.  VS stable.  After holding pressure site was soft.

## 2015-11-19 NOTE — H&P (Signed)
PCP:  [name], MD                       Cardiologist:  Dr Domenic Polite Primary Electrophysiologist: Thompson Grayer, MD                    Chief Complaint  Patient presents with  . Atrial Fibrillation     History of Present Illness: [name] Fry is a 70 y.o. female who presents today for afib ablation.   She has persistent atrial fibrillation with elevated V rates.  She has recently failed cardioversion with amiodarone.  She has symptoms of CHF and EF is 35-40%.  She does not have CAD by cath 5/17 and is felt to possibly have a tachycardia mediated CM.  Her primary symptoms are fatigue.  Her legs are weak.  She does not feel well resting upon waking in the am.    Today, she denies symptoms of palpitations, chest pain, orthopnea, PND, lower extremity edema, claudication, dizziness, presyncope, syncope, bleeding, or neurologic sequela. The patient is tolerating medications without difficulties and is otherwise without complaint today.         Past Medical History  Diagnosis Date  . Hypothyroidism   . Hyperlipidemia   . GERD (gastroesophageal reflux disease)   . Coronary atherosclerosis of native coronary artery     DES OM 3/12  . Essential hypertension, benign   . H/O hiatal hernia   . Arthritis   . Cataracts, bilateral   . Breast cancer ([place])     Left  . Atrial fibrillation [facility])     Documented January 2017         Past Surgical History  Procedure Laterality Date  . Mastectomy Left     1997; S/P chemotherapy/radiation  . Cholecystectomy    . Appendectomy    . Tubal ligation    . 25 gauge pars plana vitrectomy with 20 gauge mvr port Right 09/29/2012    Procedure: 25 GAUGE PARS PLANA VITRECTOMY WITH 20 GAUGE MVR PORT;  Surgeon: Hayden Pedro, MD;  Location: [place];  Service: Ophthalmology;  Laterality: Right;  . Laser photo ablation Right 09/29/2012    Procedure: LASER PHOTO ABLATION;  Surgeon: Hayden Pedro, MD;  Location: [place];  Service:  Ophthalmology;  Laterality: Right;  Headscope laser  . Membrane peel Right 09/29/2012    Procedure: MEMBRANE PEEL;  Surgeon: Hayden Pedro, MD;  Location: [place];  Service: Ophthalmology;  Laterality: Right;  . Gas/fluid exchange Right 09/29/2012    Procedure: GAS/FLUID EXCHANGE;  Surgeon: Hayden Pedro, MD;  Location: [place];  Service: Ophthalmology;  Laterality: Right;  . Cataract extraction w/phaco Right 05/18/2013    Procedure: CATARACT EXTRACTION PHACO AND INTRAOCULAR LENS PLACEMENT RIGHT EYE;  Surgeon: Tonny Branch, MD;  Location: [facility];  Service: Ophthalmology;  Laterality: Right;  CDE:13.35  . Cardiac catheterization N/A 07/26/2015    Procedure: Left Heart Cath and Coronary Angiography;  Surgeon: [name], MD;  Location: [place] CV LAB;  Service: Cardiovascular;  Laterality: N/A;  . Cardioversion N/A 08/30/2015    Procedure: CARDIOVERSION;  Surgeon: Satira Sark, MD;  Location: [facility];  Service: Cardiovascular;  Laterality: N/A;           Current Outpatient Prescriptions  Medication Sig Dispense Refill  . furosemide (LASIX) 40 MG tablet Take 1 tablet (40 mg total) by mouth daily as needed for fluid (or weight gain). 30 tablet 2  .  levothyroxine (SYNTHROID, LEVOTHROID) 112 MCG tablet Take 112 mcg by mouth daily.     . metoprolol succinate (TOPROL-XL) 50 MG 24 hr tablet Take 1 tablet (50 mg total) by mouth 2 (two) times daily. 180 tablet 3  . Multiple Vitamins-Minerals (OCUVITE PRESERVISION PO) Take 2 tablets by mouth daily.    . nitroGLYCERIN (NITROSTAT) 0.4 MG SL tablet Place 1 tablet (0.4 mg total) under the tongue every 5 (five) minutes x 3 doses as needed for chest pain. 25 tablet 3  . rivaroxaban (XARELTO) 20 MG TABS tablet Take 1 tablet (20 mg total) by mouth daily with supper. 28 tablet 0  . Vitamin D, Cholecalciferol, 1000 units CAPS Take 1 capsule by mouth 2 (two) times daily.     No current facility-administered medications for this  visit.    Allergies:   Doxycycline   Social History:  The patient  reports that she has never smoked. She has never used smokeless tobacco. She reports that she does not drink alcohol or use illicit drugs.   Family History:  The patient's  family history includes Heart attack (age of onset: 55) in her father.    ROS:  Please see the history of present illness.   All other systems are reviewed and negative.    PHYSICAL EXAM: Vitals:   11/19/15 0730  BP: (!) 174/128  Pulse: (!) 122  Resp: 18  Temp: 98.7 F (37.1 C)   GEN: Well nourished, well developed, in no acute distress  HEENT: normal  Neck: no JVD, carotid bruits, or masses Cardiac: tachycardic irregular rhythm Respiratory:  clear to auscultation bilaterally, normal work of breathing GI: soft, nontender, nondistended, + BS MS: no deformity or atrophy  Skin: warm and dry  Neuro:  Strength and sensation are intact Psych: euthymic mood, full affect  Labs reviewed     Wt Readings from Last 3 Encounters:  09/25/15 198 lb 3.2 oz (89.903 kg)  09/16/15 199 lb (90.266 kg)  08/30/15 199 lb (90.266 kg)     ASSESSMENT AND PLAN:  1.  Persistent atrial fibrillation The patient has symptomatic atrial arrhythmias.  She has failed medical therapy with amiodarone.  She likely has a tachycardia mediated CM.    Therapeutic strategies for afib including medicine and ablation were discussed in detail with the patient today. Risk, benefits, and alternatives to EP study and radiofrequency ablation for afib were also discussed in detail today. These risks include but are not limited to stroke, bleeding, vascular damage, tamponade, perforation, damage to the esophagus, lungs, and other structures, pulmonary vein stenosis, worsening renal function, and death. The patient understands these risk and wishes to proceed.  We will therefore proceed with catheter ablation once the patient has been adequately anticoagulated. She will  require TEE prior to the procedure to evaluate LAA for thrombus and also to better characterize her MR.  2. Nonischemic CM Increase rate control as above Hopefully will improve with sinus rhythm  Thompson Grayer MD, [facility] 11/19/2015 7:58 AM

## 2015-11-19 NOTE — Interval H&P Note (Signed)
History and Physical Interval Note:  11/19/2015 8:07 AM  [name]  has presented today for surgery, with the diagnosis of AFIB  The various methods of treatment have been discussed with the patient and family. After consideration of risks, benefits and other options for treatment, the patient has consented to  Procedure(s): TRANSESOPHAGEAL ECHOCARDIOGRAM (TEE) (N/A) as a surgical intervention .  The patient's history has been reviewed, patient examined, no change in status, stable for surgery.  I have reviewed the patient's chart and labs.  Questions were answered to the patient's satisfaction.     [name]

## 2015-11-19 NOTE — Anesthesia Procedure Notes (Signed)
Procedure Name: MAC Date/Time: 11/19/2015 10:30 AM Performed by: Merrilyn Puma B Pre-anesthesia Checklist: Patient identified, Emergency Drugs available, Suction available, Patient being monitored and Timeout performed Patient Re-evaluated:Patient Re-evaluated prior to inductionOxygen Delivery Method: Simple face mask Intubation Type: IV induction Placement Confirmation: positive ETCO2,  breath sounds checked- equal and bilateral and CO2 detector Dental Injury: Teeth and Oropharynx as per pre-operative assessment

## 2015-11-19 NOTE — Progress Notes (Signed)
To EP lab for procedure via stretcher

## 2015-11-19 NOTE — Transfer of Care (Signed)
Immediate Anesthesia Transfer of Care Note  Patient: [name] Fry  Procedure(s) Performed: Procedure(s): Atrial Fibrillation Ablation (N/A)  Patient Location: Cath Lab  Anesthesia Type:MAC  Level of Consciousness: awake, oriented and patient cooperative  Airway & Oxygen Therapy: Patient Spontanous Breathing and Patient connected to face mask oxygen  Post-op Assessment: Report given to RN and Post -op Vital signs reviewed and stable  Post vital signs: Reviewed  Last Vitals:  Vitals:   11/19/15 0940 11/19/15 1000  BP: (!) 167/108 (!) 144/98  Pulse: (!) 127 (!) 114  Resp: 19 19  Temp:      Last Pain:  Vitals:   11/19/15 0730  TempSrc: Oral         Complications: No apparent anesthesia complications

## 2015-11-19 NOTE — Progress Notes (Addendum)
Pt received from Echo alert and responsive to voice.  No complaints of dsicomfort at this time.  Pt on monitor and IVF infusing with no problem at site.  Pt awaiting for EP study.  CRNA in to see pt

## 2015-11-19 NOTE — Anesthesia Preprocedure Evaluation (Signed)
Anesthesia Evaluation  Patient identified by MRN, date of birth, ID band Patient awake    Reviewed: Allergy & Precautions, NPO status , Patient's Chart, lab work & pertinent test results  History of Anesthesia Complications Negative for: history of anesthetic complications  Airway Mallampati: III  TM Distance: >3 FB Neck ROM: Full    Dental  (+) Teeth Intact   Pulmonary shortness of breath, pneumonia,    breath sounds clear to auscultation       Cardiovascular hypertension, Pt. on medications + CAD and +CHF  + dysrhythmias  Rhythm:Irregular     Neuro/Psych negative neurological ROS  negative psych ROS   GI/Hepatic hiatal hernia, GERD  ,  Endo/Other  Hypothyroidism   Renal/GU      Musculoskeletal  (+) Arthritis ,   Abdominal   Peds  Hematology   Anesthesia Other Findings   Reproductive/Obstetrics                             Anesthesia Physical Anesthesia Plan  ASA: III  Anesthesia Plan: MAC   Post-op Pain Management:    Induction: Intravenous  Airway Management Planned: Natural Airway, Nasal Cannula and Simple Face Mask  Additional Equipment: None  Intra-op Plan:   Post-operative Plan:   Informed Consent: I have reviewed the patients History and Physical, chart, labs and discussed the procedure including the risks, benefits and alternatives for the proposed anesthesia with the patient or authorized representative who has indicated his/her understanding and acceptance.   Dental advisory given  Plan Discussed with: CRNA and Surgeon  Anesthesia Plan Comments:         Anesthesia Quick Evaluation

## 2015-11-19 NOTE — Discharge Instructions (Signed)

## 2015-11-19 NOTE — Progress Notes (Signed)
  Echocardiogram 2D Echocardiogram has been performed.  [name] Fry 11/19/2015, 10:26 AM

## 2015-11-19 NOTE — Progress Notes (Signed)
Site area: Right groin a 7,9, and 11 french venous sheath was removed   Site Prior to Removal:  Level 0  Pressure Applied For 20 MINUTES    Bedrest Beginning at  1650p  Manual:   Yes.    Patient Status During Pull:  stable  Post Pull Groin Site:  Level 0  Post Pull Instructions Given:  Yes.    Post Pull Pulses Present:  Yes.    Dressing Applied:  Yes.    Comments:  VS remain stable during sheath pull

## 2015-11-20 ENCOUNTER — Encounter (HOSPITAL_COMMUNITY): Payer: Self-pay | Admitting: Internal Medicine

## 2015-11-20 DIAGNOSIS — M1990 Unspecified osteoarthritis, unspecified site: Secondary | ICD-10-CM | POA: Diagnosis not present

## 2015-11-20 DIAGNOSIS — I481 Persistent atrial fibrillation: Principal | ICD-10-CM

## 2015-11-20 DIAGNOSIS — I2510 Atherosclerotic heart disease of native coronary artery without angina pectoris: Secondary | ICD-10-CM | POA: Diagnosis not present

## 2015-11-20 DIAGNOSIS — I429 Cardiomyopathy, unspecified: Secondary | ICD-10-CM | POA: Diagnosis not present

## 2015-11-20 DIAGNOSIS — Z9012 Acquired absence of left breast and nipple: Secondary | ICD-10-CM | POA: Diagnosis not present

## 2015-11-20 DIAGNOSIS — L7632 Postprocedural hematoma of skin and subcutaneous tissue following other procedure: Secondary | ICD-10-CM | POA: Diagnosis not present

## 2015-11-20 DIAGNOSIS — Z923 Personal history of irradiation: Secondary | ICD-10-CM | POA: Diagnosis not present

## 2015-11-20 DIAGNOSIS — Z853 Personal history of malignant neoplasm of breast: Secondary | ICD-10-CM | POA: Diagnosis not present

## 2015-11-20 DIAGNOSIS — E039 Hypothyroidism, unspecified: Secondary | ICD-10-CM | POA: Diagnosis not present

## 2015-11-20 DIAGNOSIS — Z7901 Long term (current) use of anticoagulants: Secondary | ICD-10-CM | POA: Diagnosis not present

## 2015-11-20 DIAGNOSIS — Z79899 Other long term (current) drug therapy: Secondary | ICD-10-CM | POA: Diagnosis not present

## 2015-11-20 DIAGNOSIS — E785 Hyperlipidemia, unspecified: Secondary | ICD-10-CM | POA: Diagnosis not present

## 2015-11-20 DIAGNOSIS — K219 Gastro-esophageal reflux disease without esophagitis: Secondary | ICD-10-CM | POA: Diagnosis not present

## 2015-11-20 DIAGNOSIS — Z9221 Personal history of antineoplastic chemotherapy: Secondary | ICD-10-CM | POA: Diagnosis not present

## 2015-11-20 DIAGNOSIS — I5022 Chronic systolic (congestive) heart failure: Secondary | ICD-10-CM | POA: Diagnosis not present

## 2015-11-20 DIAGNOSIS — I110 Hypertensive heart disease with heart failure: Secondary | ICD-10-CM | POA: Diagnosis not present

## 2015-11-20 LAB — BASIC METABOLIC PANEL
Anion gap: 9 (ref 5–15)
BUN: 12 mg/dL (ref 6–20)
CALCIUM: 8.5 mg/dL — AB (ref 8.9–10.3)
CHLORIDE: 109 mmol/L (ref 101–111)
CO2: 23 mmol/L (ref 22–32)
CREATININE: 0.84 mg/dL (ref 0.44–1.00)
GFR calc non Af Amer: >60 mL/min (ref >60)
Glucose, Bld: 115 mg/dL — ABNORMAL HIGH (ref 65–99)
Potassium: 3.8 mmol/L (ref 3.5–5.1)
SODIUM: 141 mmol/L (ref 135–145)

## 2015-11-20 LAB — CBC
HCT: 30.8 % — ABNORMAL LOW (ref 36.0–46.0)
Hemoglobin: 9.6 g/dL — ABNORMAL LOW (ref 12.0–15.0)
MCH: 28 pg (ref 26.0–34.0)
MCHC: 31.2 g/dL (ref 30.0–36.0)
MCV: 89.8 fL (ref 78.0–100.0)
PLATELETS: 135 10*3/uL — AB (ref 150–400)
RBC: 3.43 MIL/uL — AB (ref 3.87–5.11)
RDW: 14.3 % (ref 11.5–15.5)
WBC: 7.8 10*3/uL (ref 4.0–10.5)

## 2015-11-20 MED ORDER — METOPROLOL SUCCINATE ER 25 MG PO TB24: 25.0000 mg | ORAL_TABLET | Freq: Every day | ORAL | 0 refills | Status: DC

## 2015-11-20 MED ORDER — PANTOPRAZOLE SODIUM 40 MG PO TBEC: 40.0000 mg | DELAYED_RELEASE_TABLET | Freq: Every day | ORAL | 0 refills | Status: DC

## 2015-11-20 NOTE — Progress Notes (Signed)
DC instructions given to patient and daughters at bedside. Questions answered; educated on f/u appts, medication changes and new Rx. VSS. All belongings sent with family. PIV DC, hemostasis achieved. Groin site level 2, mild firmness noted to R and labia majora firm, purple bruising noted mid to R. Dressing changed, CDI,  no bleeding noted. eICU and CCMD notified of discharge. HR NSR. Pt escorted by volunteer via wheelchair to private vehicle driven by daughter.

## 2015-11-22 ENCOUNTER — Telehealth: Payer: Self-pay | Admitting: *Deleted

## 2015-11-22 ENCOUNTER — Other Ambulatory Visit: Payer: Self-pay | Admitting: Cardiology

## 2015-11-22 NOTE — Anesthesia Postprocedure Evaluation (Signed)
Anesthesia Post Note  Patient: [name] Fry  Procedure(s) Performed: Procedure(s) (LRB): Atrial Fibrillation Ablation (N/A)  Patient location during evaluation: PACU Anesthesia Type: MAC Level of consciousness: awake Pain management: pain level controlled Vital Signs Assessment: post-procedure vital signs reviewed and stable Respiratory status: spontaneous breathing Cardiovascular status: stable Postop Assessment: no signs of nausea or vomiting Anesthetic complications: no    Last Vitals:  Vitals:   11/20/15 0732 11/20/15 0800  BP: 130/71 (!) 104/56  Pulse: 70 61  Resp: (!) 8 16  Temp: 36.5 C     Last Pain:  Vitals:   11/20/15 0732  TempSrc: Oral  PainSc:                  [name]

## 2015-11-22 NOTE — Telephone Encounter (Signed)
MED FILLED

## 2015-11-27 ENCOUNTER — Encounter (HOSPITAL_COMMUNITY): Payer: Self-pay | Admitting: *Deleted

## 2015-12-17 ENCOUNTER — Other Ambulatory Visit (HOSPITAL_COMMUNITY): Payer: Self-pay | Admitting: *Deleted

## 2015-12-17 ENCOUNTER — Encounter (HOSPITAL_COMMUNITY): Payer: Self-pay | Admitting: Nurse Practitioner

## 2015-12-17 ENCOUNTER — Ambulatory Visit (HOSPITAL_COMMUNITY)
Admission: RE | Admit: 2015-12-17 | Discharge: 2015-12-17 | Disposition: A | Discharge status: Home / Self Care | Payer: Medicare Other | Source: Ambulatory Visit | Attending: Nurse Practitioner | Admitting: Nurse Practitioner

## 2015-12-17 VITALS — BP 160/86 | HR 72 | Ht 63.0 in | Wt 195.4 lb

## 2015-12-17 DIAGNOSIS — E039 Hypothyroidism, unspecified: Secondary | ICD-10-CM | POA: Diagnosis not present

## 2015-12-17 DIAGNOSIS — I4892 Unspecified atrial flutter: Secondary | ICD-10-CM | POA: Insufficient documentation

## 2015-12-17 DIAGNOSIS — Z7901 Long term (current) use of anticoagulants: Secondary | ICD-10-CM | POA: Diagnosis not present

## 2015-12-17 DIAGNOSIS — E785 Hyperlipidemia, unspecified: Secondary | ICD-10-CM | POA: Diagnosis not present

## 2015-12-17 DIAGNOSIS — I429 Cardiomyopathy, unspecified: Secondary | ICD-10-CM | POA: Diagnosis not present

## 2015-12-17 DIAGNOSIS — Z853 Personal history of malignant neoplasm of breast: Secondary | ICD-10-CM | POA: Insufficient documentation

## 2015-12-17 DIAGNOSIS — I4819 Other persistent atrial fibrillation: Secondary | ICD-10-CM

## 2015-12-17 DIAGNOSIS — M1990 Unspecified osteoarthritis, unspecified site: Secondary | ICD-10-CM | POA: Insufficient documentation

## 2015-12-17 DIAGNOSIS — E669 Obesity, unspecified: Secondary | ICD-10-CM | POA: Diagnosis not present

## 2015-12-17 DIAGNOSIS — Z9889 Other specified postprocedural states: Secondary | ICD-10-CM | POA: Insufficient documentation

## 2015-12-17 DIAGNOSIS — I10 Essential (primary) hypertension: Secondary | ICD-10-CM | POA: Insufficient documentation

## 2015-12-17 DIAGNOSIS — Z6834 Body mass index (BMI) 34.0-34.9, adult: Secondary | ICD-10-CM | POA: Insufficient documentation

## 2015-12-17 DIAGNOSIS — I428 Other cardiomyopathies: Secondary | ICD-10-CM

## 2015-12-17 DIAGNOSIS — K219 Gastro-esophageal reflux disease without esophagitis: Secondary | ICD-10-CM | POA: Diagnosis not present

## 2015-12-17 DIAGNOSIS — I2510 Atherosclerotic heart disease of native coronary artery without angina pectoris: Secondary | ICD-10-CM | POA: Diagnosis not present

## 2015-12-17 DIAGNOSIS — I481 Persistent atrial fibrillation: Secondary | ICD-10-CM | POA: Insufficient documentation

## 2015-12-17 DIAGNOSIS — R000 Tachycardia, unspecified: Secondary | ICD-10-CM | POA: Diagnosis not present

## 2015-12-17 LAB — BASIC METABOLIC PANEL
ANION GAP: 10 (ref 5–15)
BUN: 10 mg/dL (ref 6–20)
CHLORIDE: 104 mmol/L (ref 101–111)
CO2: 25 mmol/L (ref 22–32)
Calcium: 9.8 mg/dL (ref 8.9–10.3)
Creatinine, Ser: 0.65 mg/dL (ref 0.44–1.00)
GFR calc Af Amer: >60 mL/min (ref >60)
GLUCOSE: 114 mg/dL — AB (ref 65–99)
POTASSIUM: 4.1 mmol/L (ref 3.5–5.1)
SODIUM: 139 mmol/L (ref 135–145)

## 2015-12-17 LAB — CBC
HCT: 38.4 % (ref 36.0–46.0)
HEMOGLOBIN: 12.4 g/dL (ref 12.0–15.0)
MCH: 29 pg (ref 26.0–34.0)
MCHC: 32.3 g/dL (ref 30.0–36.0)
MCV: 89.7 fL (ref 78.0–100.0)
PLATELETS: 265 10*3/uL (ref 150–400)
RBC: 4.28 MIL/uL (ref 3.87–5.11)
RDW: 14.5 % (ref 11.5–15.5)
WBC: 6.8 10*3/uL (ref 4.0–10.5)

## 2015-12-17 LAB — DIGOXIN LEVEL: DIGOXIN LVL: 0.8 ng/mL (ref 0.8–2.0)

## 2015-12-17 MED ORDER — DIGOXIN 125 MCG PO TABS: 125.0000 ug | ORAL_TABLET | Freq: Every day | ORAL | 1 refills | Status: DC

## 2015-12-17 MED ORDER — METOPROLOL SUCCINATE ER 25 MG PO TB24: 25.0000 mg | ORAL_TABLET | Freq: Every day | ORAL | 1 refills | Status: DC

## 2015-12-17 NOTE — Progress Notes (Signed)
Primary Care Physician: [name], MD Primary Cardiologist: Domenic Polite Primary Electrophysiologist: Osa Craver is a 70 y.o. female with a h/o persistent atrial fibrillation who presents for follow up in the [facility]. She feels that since AF ablation, she is improved symptomatically.  She has more energy and shortness of breath is improved. She only took the Protonix for 2 weeks and has been off it since. She reports compliance with Xarelto without any missed doses.  She has not yet had sleep study.  She has not had procedural related complications.   She has been working on walking more.  She is not able to walk to her mailbox because of the hill coming back up but is walking around the driveway and has a treadmill.  Today, she denies symptoms of palpitations, chest pain, shortness of breath, orthopnea, PND, lower extremity edema, dizziness, presyncope, syncope, snoring, daytime somnolence, bleeding, or neurologic sequela. The patient is tolerating medications without difficulties and is otherwise without complaint today.    Past Medical History:  Diagnosis Date  . Arthritis   . Atrial fibrillation [facility])    Documented January 2017  . Breast cancer ([place])    Left  . Cataracts, bilateral   . Coronary atherosclerosis of native coronary artery    DES OM 3/12  . Essential hypertension, benign   . GERD (gastroesophageal reflux disease)   . H/O hiatal hernia   . Hyperlipidemia   . Hypothyroidism    Past Surgical History:  Procedure Laterality Date  . [place] VITRECTOMY WITH 20 GAUGE MVR PORT Right 09/29/2012   Procedure: 25 GAUGE PARS PLANA VITRECTOMY WITH 20 GAUGE MVR PORT;  Surgeon: Hayden Pedro, MD;  Location: [place];  Service: Ophthalmology;  Laterality: Right;  . APPENDECTOMY    . CARDIAC CATHETERIZATION N/A 07/26/2015   Procedure: Left Heart Cath and Coronary Angiography;  Surgeon: [name], MD;  Location: [place]  CV LAB;  Service: Cardiovascular;  Laterality: N/A;  . CARDIOVERSION N/A 08/30/2015   Procedure: CARDIOVERSION;  Surgeon: Satira Sark, MD;  [facility];  Service: Cardiovascular;  Laterality: N/A;  . CATARACT EXTRACTION W/PHACO Right 05/18/2013   Procedure: CATARACT EXTRACTION PHACO AND INTRAOCULAR LENS PLACEMENT RIGHT EYE;  Surgeon: Tonny Branch, MD;  [facility]: [facility];  Service: Ophthalmology;  Laterality: Right;  CDE:13.35  . CHOLECYSTECTOMY    . ELECTROPHYSIOLOGIC STUDY N/A 11/19/2015   Procedure: Atrial Fibrillation Ablation;  Surgeon: Thompson Grayer, MD;  Location: [place] CV LAB;  Service: Cardiovascular;  Laterality: N/A;  . GAS/FLUID EXCHANGE Right 09/29/2012   Procedure: GAS/FLUID EXCHANGE;  Surgeon: Hayden Pedro, MD;  Location: [place];  Service: Ophthalmology;  Laterality: Right;  . LASER PHOTO ABLATION Right 09/29/2012   Procedure: LASER PHOTO ABLATION;  Surgeon: Hayden Pedro, MD;  Location: [place];  Service: Ophthalmology;  Laterality: Right;  Headscope laser  . MASTECTOMY Left    1997; S/P chemotherapy/radiation  . MEMBRANE PEEL Right 09/29/2012   Procedure: MEMBRANE PEEL;  Surgeon: Hayden Pedro, MD;  Location: [place];  Service: Ophthalmology;  Laterality: Right;  . TEE WITHOUT CARDIOVERSION N/A 11/19/2015   Procedure: TRANSESOPHAGEAL ECHOCARDIOGRAM (TEE);  Surgeon: Thayer Headings, MD;  Location: [place];  Service: Cardiovascular;  Laterality: N/A;  . TUBAL LIGATION      Current Outpatient Prescriptions  Medication Sig Dispense Refill  . digoxin (LANOXIN) 0.125 MG tablet Take 1 tablet (125 mcg total) by mouth  daily. 90 tablet 1  . furosemide (LASIX) 40 MG tablet Take 1 tablet (40 mg total) by mouth daily as needed for fluid (or weight gain). 30 tablet 2  . levothyroxine (SYNTHROID, LEVOTHROID) 112 MCG tablet Take 112 mcg by mouth daily.     . metoprolol succinate (TOPROL-XL) 25 MG 24 hr tablet Take 1 tablet (25 mg total) by mouth daily. 90 tablet 1  .  Multiple Vitamins-Minerals (OCUVITE PRESERVISION PO) Take 2 tablets by mouth daily.    Marland Kitchen NITROSTAT 0.4 MG SL tablet PLACE 1 TABLET UNDER THE TONGUE EVERY 5 MINUTES FOR 3 DOSES AS NEEDED CHEST PAIN 25 tablet 3  . rivaroxaban (XARELTO) 20 MG TABS tablet Take 1 tablet (20 mg total) by mouth daily with supper. 28 tablet 0  . Vitamin D, Cholecalciferol, 1000 units CAPS Take 1 capsule by mouth 2 (two) times daily.     No current facility-administered medications for this encounter.     Allergies  Allergen Reactions  . Doxycycline Rash    Social History   Social History  . Marital status: Widowed    Spouse name: N/A  . Number of children: N/A  . Years of education: N/A   Occupational History  . Retired    Social History Main Topics  . Smoking status: Never Smoker  . Smokeless tobacco: Never Used  . Alcohol use No  . Drug use: No  . Sexual activity: No   Other Topics Concern  . Not on file   Social History Narrative  . No narrative on file    Family History  Problem Relation Age of Onset  . Heart attack Father 68   ROS- All systems are reviewed and negative except as per the HPI above.  Physical Exam: Vitals:   12/17/15 1405  BP: (!) 160/86  Pulse: 72  Weight: 195 lb 6.4 oz (88.6 kg)  Height: 5\' 3"  (1.6 m)    GEN- The patient is elderly appearing, alert and oriented x 3 today.   Head- normocephalic, atraumatic Eyes-  Sclera clear, conjunctiva pink Ears- hearing intact Oropharynx- clear Neck- supple  Lungs- Clear to ausculation bilaterally, normal work of breathing Heart- Regular rate and rhythm, no murmurs, rubs or gallops  GI- soft, NT, ND, + BS Extremities- no clubbing, cyanosis, or edema MS- no significant deformity or atrophy Skin- no rash or lesion Psych- euthymic mood, full affect Neuro- strength and sensation are intact  EKG today demonstrates SR rate 72, LBBB QRS 159msec Echo 07/2015 demonstrated EF 35-40%, global hypokinesis, moderate MR, LA  35  Epic records are reviewed at length today  Assessment and Plan:  1. Persistent atrial fibrillation and atrial flutter  Doing well s/p ablation 11/19/15 She does not feel that she has had recurrent AF Continue Xarelto for CHADS2VASC of 4 BMET, CBC, Dig level today   2. Obesity Body mass index is 34.61 kg/m. Lifestyle modification was discussed at length including regular exercise and weight reduction.  3. Sleep disordered breathing  The importance of adequate treatment of sleep apnea was discussed today in order to improve our ability to maintain sinus rhythm long term. She is now willing to undergo sleep study - will schedule at [facility]  4.  Tachycardia induced cardiomyopathy Will update echo prior to appt with Dr [name] 02/2016 She is tolerating BB and Digoxin Ideally would be on ACE-I, but will see what repeat echo is first  5.  HTN Above goal today She will check blood pressure at home and  take log to Dr Domenic Polite at appt in 2 weeks.  If BP remains elevated at that time, could consider addition of ACE-I  Chanetta Marshall, NP 12/17/2015 2:58 PM

## 2015-12-18 ENCOUNTER — Ambulatory Visit: Payer: Self-pay | Admitting: Internal Medicine

## 2015-12-20 ENCOUNTER — Other Ambulatory Visit: Payer: Self-pay | Admitting: Cardiology

## 2015-12-20 MED ORDER — METOPROLOL SUCCINATE ER 25 MG PO TB24: 25.0000 mg | ORAL_TABLET | Freq: Every day | ORAL | 1 refills | Status: DC

## 2015-12-20 NOTE — Telephone Encounter (Signed)
Mrs. Fees called stating that she needs refill on metoprolol succinate (TOPROL-XL) 25 MG    States that this was given to her in the ER.  [facility]

## 2015-12-20 NOTE — Telephone Encounter (Signed)
Medication sent to pharmacy  

## 2015-12-31 NOTE — Progress Notes (Signed)
Cardiology Office Note  Date: 01/01/2016   ID: [name] Fry, DOB 08/03/1945, MRN [id number]  PCP: [name], MD  Primary Cardiologist: Rozann Lesches, MD   Chief Complaint  Patient presents with  . Coronary Artery Disease  . PAF    History of Present Illness: [name] Fry is a 70 y.o. female last seen in September. She had a recent visit in the [facility] on October 3. She has undergone atrial fibrillation ablation and is maintaining sinus rhythm at this point. She tells me that she does feel better, less anxious and short of breath. Stamina has improved.  I went over her medications. She continues on Xarelto, Toprol-XL, and Lanoxin. LVEF was 30-35% by TEE done in early September prior to her ablation. She does have a pending follow-up echocardiogram in November. We discussed adding a low-dose ARB to her regimen in light of cardiomyopathy. She also states that her blood pressure has been elevated at home when she checks it, systolics sometimes in the 160s.  She does not report any angina symptoms. I reviewed her recent ECG and lab work as outlined below.  Past Medical History:  Diagnosis Date  . Arthritis   . Atrial fibrillation [facility])    Documented January 2017  . Breast cancer ([place])    Left  . Cataracts, bilateral   . Coronary atherosclerosis of native coronary artery    DES OM 3/12  . Essential hypertension, benign   . GERD (gastroesophageal reflux disease)   . H/O hiatal hernia   . Hyperlipidemia   . Hypothyroidism     Past Surgical History:  Procedure Laterality Date  . [place] VITRECTOMY WITH 20 GAUGE MVR PORT Right 09/29/2012   Procedure: 25 GAUGE PARS PLANA VITRECTOMY WITH 20 GAUGE MVR PORT;  Surgeon: Hayden Pedro, MD;  Location: [place];  Service: Ophthalmology;  Laterality: Right;  . APPENDECTOMY    . CARDIAC CATHETERIZATION N/A 07/26/2015   Procedure: Left Heart Cath and Coronary Angiography;  Surgeon: [name], MD;   Location: [place] CV LAB;  Service: Cardiovascular;  Laterality: N/A;  . CARDIOVERSION N/A 08/30/2015   Procedure: CARDIOVERSION;  Surgeon: Satira Sark, MD;  [facility];  Service: Cardiovascular;  Laterality: N/A;  . CATARACT EXTRACTION W/PHACO Right 05/18/2013   Procedure: CATARACT EXTRACTION PHACO AND INTRAOCULAR LENS PLACEMENT RIGHT EYE;  Surgeon: Tonny Branch, MD;  [facility];  Service: Ophthalmology;  Laterality: Right;  CDE:13.35  . CHOLECYSTECTOMY    . ELECTROPHYSIOLOGIC STUDY N/A 11/19/2015   Procedure: Atrial Fibrillation Ablation;  Surgeon: Thompson Grayer, MD;  Location: [place] CV LAB;  Service: Cardiovascular;  Laterality: N/A;  . GAS/FLUID EXCHANGE Right 09/29/2012   Procedure: GAS/FLUID EXCHANGE;  Surgeon: Hayden Pedro, MD;  Location: [place];  Service: Ophthalmology;  Laterality: Right;  . LASER PHOTO ABLATION Right 09/29/2012   Procedure: LASER PHOTO ABLATION;  Surgeon: Hayden Pedro, MD;  Location: [place];  Service: Ophthalmology;  Laterality: Right;  Headscope laser  . MASTECTOMY Left    1997; S/P chemotherapy/radiation  . MEMBRANE PEEL Right 09/29/2012   Procedure: MEMBRANE PEEL;  Surgeon: Hayden Pedro, MD;  Location: [place];  Service: Ophthalmology;  Laterality: Right;  . TEE WITHOUT CARDIOVERSION N/A 11/19/2015   Procedure: TRANSESOPHAGEAL ECHOCARDIOGRAM (TEE);  Surgeon: Thayer Headings, MD;  Location: [place];  Service: Cardiovascular;  Laterality: N/A;  . TUBAL LIGATION      Current Outpatient Prescriptions  Medication Sig  Dispense Refill  . digoxin (LANOXIN) 0.125 MG tablet Take 1 tablet (125 mcg total) by mouth daily. 90 tablet 1  . furosemide (LASIX) 40 MG tablet Take 1 tablet (40 mg total) by mouth daily as needed for fluid (or weight gain). 30 tablet 2  . levothyroxine (SYNTHROID, LEVOTHROID) 112 MCG tablet Take 112 mcg by mouth daily.     . metoprolol succinate (TOPROL-XL) 25 MG 24 hr tablet Take 1 tablet (25 mg total) by mouth daily.  90 tablet 1  . Multiple Vitamins-Minerals (OCUVITE PRESERVISION PO) Take 2 tablets by mouth daily.    Marland Kitchen NITROSTAT 0.4 MG SL tablet PLACE 1 TABLET UNDER THE TONGUE EVERY 5 MINUTES FOR 3 DOSES AS NEEDED CHEST PAIN 25 tablet 3  . rivaroxaban (XARELTO) 20 MG TABS tablet Take 1 tablet (20 mg total) by mouth daily with supper. 28 tablet 0  . Vitamin D, Cholecalciferol, 1000 units CAPS Take 1 capsule by mouth daily.     Marland Kitchen losartan (COZAAR) 25 MG tablet Take 0.5 tablets (12.5 mg total) by mouth daily. 15 tablet 6   No current facility-administered medications for this visit.    Allergies:  Doxycycline   Social History: The patient  reports that she has never smoked. She has never used smokeless tobacco. She reports that she does not drink alcohol or use drugs.   ROS:  Please see the history of present illness. Otherwise, complete review of systems is positive for trouble with memory.  All other systems are reviewed and negative.   Physical Exam: VS:  BP 138/78   Pulse 72   Ht 5\' 3"  (1.6 m)   Wt 193 lb (87.5 kg)   LMP 09/28/2012   BMI 34.19 kg/m , BMI Body mass index is 34.19 kg/m.  Wt Readings from Last 3 Encounters:  01/01/16 193 lb (87.5 kg)  12/17/15 195 lb 6.4 oz (88.6 kg)  11/19/15 197 lb (89.4 kg)    Overweight woman, no distress.Marland Kitchen  HEENT: Conjunctiva and lids normal, oropharynx with moist mucosa.  Neck: Supple, no elevated JVP or bruits.  Lungs: Clear to auscultation, nonlabored.  Cardiac: RRR, no S3.  Abdomen: Soft, nontender, bowel sounds present.  Skin: Warm and dry.  Extremities: Trace leg edema. Musculoskeletal: No kyphosis. Neuropsychiatric: Alert and oriented 3, affect appropriate.  ECG: I personally reviewed the tracing from 12/17/2015 which showed sinus rhythm with left bundle-branch block.   Recent Labwork: 10/29/2015: ALT 22; AST 22; B Natriuretic Peptide 1,555.0; TSH 5.133 10/30/2015: Magnesium 1.7 12/17/2015: BUN 10; Creatinine, Ser 0.65; Hemoglobin 12.4;  Platelets 265; Potassium 4.1; Sodium 139   Other Studies Reviewed Today:  TEE 11/19/2015: Study Conclusions  - Left ventricle: Systolic function was moderately to severely   reduced. The estimated ejection fraction was in the range of 30%   to 35%. - Aortic valve: No evidence of vegetation. There was trivial   regurgitation. - Mitral valve: There was moderate regurgitation. - Left atrium: No evidence of thrombus in the atrial cavity or   appendage. - Tricuspid valve: There was mild-moderate regurgitation. - Pericardium, extracardiac: A trivial pericardial effusion was   identified.  Assessment and Plan:  1. Paroxysmal atrial fibrillation status post ablation, maintaining sinus rhythm at this point and not on antiarrhythmic therapy. She continues on Xarelto, no bleeding problems, recent lab work reviewed.  2. Nonischemic cardiomyopathy, LVEF 30-35% by TEE in early September. She has a follow-up echocardiogram for November. Continue current regimen including Toprol-XL and Lanoxin. We will add low-dose Cozaar 12.5  mg daily. Follow blood pressure, also BMET in 2 weeks.  3. CAD status post DES to the obtuse marginal in 2012, patent at angiography in May of this year.  4. Essential hypertension, trend has been up by home checks. Adding low-dose Cozaar.  Current medicines were reviewed with the patient today.   Orders Placed This Encounter  Procedures  . Basic metabolic panel    Disposition: Follow-up with me in 3 months.  Signed, Satira Sark, MD, [facility] 01/01/2016 11:31 AM    [place] [facility] [address], [place], [place] [address] Phone: [phone]; Fax: [phone]

## 2016-01-01 ENCOUNTER — Encounter: Payer: Self-pay | Admitting: Cardiology

## 2016-01-01 ENCOUNTER — Other Ambulatory Visit: Payer: Self-pay | Admitting: *Deleted

## 2016-01-01 ENCOUNTER — Ambulatory Visit (INDEPENDENT_AMBULATORY_CARE_PROVIDER_SITE_OTHER): Payer: Medicare Other | Admitting: Cardiology

## 2016-01-01 VITALS — BP 138/78 | HR 72 | Ht 63.0 in | Wt 193.0 lb

## 2016-01-01 DIAGNOSIS — I480 Paroxysmal atrial fibrillation: Secondary | ICD-10-CM | POA: Diagnosis not present

## 2016-01-01 DIAGNOSIS — I2510 Atherosclerotic heart disease of native coronary artery without angina pectoris: Secondary | ICD-10-CM

## 2016-01-01 DIAGNOSIS — I5021 Acute systolic (congestive) heart failure: Secondary | ICD-10-CM | POA: Diagnosis not present

## 2016-01-01 DIAGNOSIS — I4819 Other persistent atrial fibrillation: Secondary | ICD-10-CM

## 2016-01-01 DIAGNOSIS — I428 Other cardiomyopathies: Secondary | ICD-10-CM | POA: Diagnosis not present

## 2016-01-01 DIAGNOSIS — I10 Essential (primary) hypertension: Secondary | ICD-10-CM

## 2016-01-01 MED ORDER — LOSARTAN POTASSIUM 25 MG PO TABS: 12.5000 mg | ORAL_TABLET | Freq: Every day | ORAL | 6 refills | Status: DC

## 2016-01-01 NOTE — Patient Instructions (Signed)
Medication Instructions:   Begin Cozaar 12.5mg  daily.  Continue all other medications.    Labwork:  BMET - order given today.  Due in 2 approximately 2 weeks.  Office will contact with results via phone or letter.    Testing/Procedures: none  Follow-Up: 3 months   Any Other Special Instructions Will Be Listed Below (If Applicable).  If you need a refill on your cardiac medications before your next appointment, please call your pharmacy.

## 2016-01-02 ENCOUNTER — Ambulatory Visit: Payer: Medicare Other | Attending: Nurse Practitioner | Admitting: Neurology

## 2016-01-02 DIAGNOSIS — G4733 Obstructive sleep apnea (adult) (pediatric): Secondary | ICD-10-CM | POA: Diagnosis not present

## 2016-01-02 DIAGNOSIS — I4819 Other persistent atrial fibrillation: Secondary | ICD-10-CM

## 2016-01-02 DIAGNOSIS — I481 Persistent atrial fibrillation: Secondary | ICD-10-CM | POA: Insufficient documentation

## 2016-01-04 NOTE — Procedures (Signed)
   [place] A. [name], MD     [email or web address]             NOCTURNAL POLYSOMNOGRAPHY   LOCATION: [facility]  Patient Name: [name] Fry, [name] Fry Date: 01/02/2016 Gender: Female D.O.B: 1945-11-03 Age (years): 88 Referring Provider: Chanetta Marshall Height (inches): 63 Interpreting Physician: Phillips Odor MD, ABSM Weight (lbs): 195 RPSGT: Peak, Robert BMI: 35 MRN: [id number] Neck Size: 15.00 CLINICAL INFORMATION Sleep Study Type: NPSG Indication for sleep study: N/A Epworth Sleepiness Score: 3 SLEEP STUDY TECHNIQUE As per the AASM Manual for the Scoring of Sleep and Associated Events v2.3 (April 2016) with a hypopnea requiring 4% desaturations. The channels recorded and monitored were frontal, central and occipital EEG, electrooculogram (EOG), submentalis EMG (chin), nasal and oral airflow, thoracic and abdominal wall motion, anterior tibialis EMG, snore microphone, electrocardiogram, and pulse oximetry. MEDICATIONS Medications self-administered by patient taken the night of the study : N/A  Current Outpatient Prescriptions:  .  digoxin (LANOXIN) 0.125 MG tablet, Take 1 tablet (125 mcg total) by mouth daily., Disp: 90 tablet, Rfl: 1 .  furosemide (LASIX) 40 MG tablet, Take 1 tablet (40 mg total) by mouth daily as needed for fluid (or weight gain)., Disp: 30 tablet, Rfl: 2 .  levothyroxine (SYNTHROID, LEVOTHROID) 112 MCG tablet, Take 112 mcg by mouth daily. , Disp: , Rfl:  .  losartan (COZAAR) 25 MG tablet, Take 0.5 tablets (12.5 mg total) by mouth daily., Disp: 15 tablet, Rfl: 6 .  metoprolol succinate (TOPROL-XL) 25 MG 24 hr tablet, Take 1 tablet (25 mg total) by mouth daily., Disp: 90 tablet, Rfl: 1 .  Multiple Vitamins-Minerals (OCUVITE PRESERVISION PO), Take 2 tablets by mouth daily., Disp: , Rfl:  .  NITROSTAT 0.4 MG SL tablet, PLACE 1 TABLET UNDER THE TONGUE EVERY 5 MINUTES FOR 3 DOSES AS NEEDED CHEST PAIN, Disp: 25 tablet, Rfl: 3 .  rivaroxaban  (XARELTO) 20 MG TABS tablet, Take 1 tablet (20 mg total) by mouth daily with supper., Disp: 28 tablet, Rfl: 0 .  Vitamin D, Cholecalciferol, 1000 units CAPS, Take 1 capsule by mouth daily. , Disp: , Rfl:   SLEEP ARCHITECTURE The study was initiated at 9:51:46 PM and ended at 3:48:45 AM. Sleep onset time was 100.5 minutes and the sleep efficiency was 56.4%. The total sleep time was 201.5 minutes. Stage REM latency was 99.5 minutes. The patient spent 8.19% of the night in stage N1 sleep, 42.68% in stage N2 sleep, 18.86% in stage N3 and 30.27% in REM. Alpha intrusion was absent. Supine sleep was 0.00%. RESPIRATORY PARAMETERS The overall apnea/hypopnea index (AHI) was 6.0 per hour. There were 1 total apneas, including 0 obstructive, 1 central and 0 mixed apneas. There were 19 hypopneas and 8 RERAs. The AHI during Stage REM sleep was 12.8 per hour. AHI while supine was N/A per hour. The mean oxygen saturation was 93.80%. The minimum SpO2 during sleep was 82.00%. snoring was noted during this study. CARDIAC DATA The 2 lead EKG demonstrated sinus rhythm. The mean heart rate was 66.19 beats per minute. Other EKG findings include: None. LEG MOVEMENT DATA The total PLMS were 0 with a resulting PLMS index of 0.00. Associated arousal with leg movement index was 0.0.   IMPRESSIONS - The study reveals mild obstructive sleep apnea that does not require positive pressure treatment.     Delano Metz, MD Diplomate, American Board of Sleep Medicine.

## 2016-01-13 ENCOUNTER — Other Ambulatory Visit: Payer: Self-pay | Admitting: Cardiovascular Disease

## 2016-01-15 DIAGNOSIS — I4891 Unspecified atrial fibrillation: Secondary | ICD-10-CM | POA: Diagnosis not present

## 2016-01-15 DIAGNOSIS — E039 Hypothyroidism, unspecified: Secondary | ICD-10-CM | POA: Diagnosis not present

## 2016-01-15 DIAGNOSIS — Z853 Personal history of malignant neoplasm of breast: Secondary | ICD-10-CM | POA: Diagnosis not present

## 2016-01-15 DIAGNOSIS — M545 Low back pain: Secondary | ICD-10-CM | POA: Diagnosis not present

## 2016-01-15 DIAGNOSIS — Z0000 Encounter for general adult medical examination without abnormal findings: Secondary | ICD-10-CM | POA: Diagnosis not present

## 2016-01-17 ENCOUNTER — Telehealth: Payer: Self-pay | Admitting: *Deleted

## 2016-01-17 NOTE — Telephone Encounter (Signed)
Patient informed. 

## 2016-01-17 NOTE — Telephone Encounter (Signed)
-----   Message from Patsey Berthold, NP sent at 01/17/2016  4:25 PM EDT ----- This didn't come to me before.  I apologize for the delay in reviewing.  Mild OSA - no specific treatment recommended.  Please encourage her to continue working on exercise and diet.  Thank you!  ----- Message ----- From: [name], LPN Sent: [id number]   3:14 PM To: Patsey Berthold, NP  This patient is requesting results on this sleep study or for any recommendations. Can you advise on this please?

## 2016-01-17 NOTE — Telephone Encounter (Signed)
Patient informed and copy sent to PCP. 

## 2016-01-17 NOTE — Telephone Encounter (Signed)
-----   Message from Satira Sark, MD sent at 01/17/2016  8:35 AM EDT ----- Results reviewed. Hemoglobin, creatinine, and potassium normal after recent medication adjustments. A copy of this test should be forwarded to [name], MD.

## 2016-01-20 DIAGNOSIS — Z961 Presence of intraocular lens: Secondary | ICD-10-CM | POA: Diagnosis not present

## 2016-01-20 DIAGNOSIS — H353132 Nonexudative age-related macular degeneration, bilateral, intermediate dry stage: Secondary | ICD-10-CM | POA: Diagnosis not present

## 2016-01-20 DIAGNOSIS — H35371 Puckering of macula, right eye: Secondary | ICD-10-CM | POA: Diagnosis not present

## 2016-01-20 DIAGNOSIS — H25812 Combined forms of age-related cataract, left eye: Secondary | ICD-10-CM | POA: Diagnosis not present

## 2016-01-30 NOTE — Patient Instructions (Signed)
Your procedure is scheduled on: 04/12/2015  Report to [facility] at  61   AM.  Call this number if you have problems the morning of surgery: [phone]   Do not eat food or drink liquids :After Midnight.      Take these medicines the morning of surgery with A SIP OF WATER: lanoxin, levothyroxine, cozaar, metoprolol.   Do not wear jewelry, make-up or nail polish.  Do not wear lotions, powders, or perfumes. You may wear deodorant.  Do not shave 48 hours prior to surgery.  Do not bring valuables to the hospital.  Contacts, dentures or bridgework may not be worn into surgery.  Leave suitcase in the car. After surgery it may be brought to your room.  For patients admitted to the hospital, checkout time is 11:00 AM the day of discharge.   Patients discharged the day of surgery will not be allowed to drive home.  :     Please read over the following fact sheets that you were given: Coughing and Deep Breathing, Surgical Site Infection Prevention, Anesthesia Post-op Instructions and Care and Recovery After Surgery    Cataract A cataract is a clouding of the lens of the eye. When a lens becomes cloudy, vision is reduced based on the degree and nature of the clouding. Many cataracts reduce vision to some degree. Some cataracts make people more near-sighted as they develop. Other cataracts increase glare. Cataracts that are ignored and become worse can sometimes look white. The white color can be seen through the pupil. CAUSES   Aging. However, cataracts may occur at any age, even in newborns.   Certain drugs.   Trauma to the eye.   Certain diseases such as diabetes.   Specific eye diseases such as chronic inflammation inside the eye or a sudden attack of a rare form of glaucoma.   Inherited or acquired medical problems.  SYMPTOMS   Gradual, progressive drop in vision in the affected eye.   Severe, rapid visual loss. This most often happens when trauma is the cause.  DIAGNOSIS  To  detect a cataract, an eye doctor examines the lens. Cataracts are best diagnosed with an exam of the eyes with the pupils enlarged (dilated) by drops.  TREATMENT  For an early cataract, vision may improve by using different eyeglasses or stronger lighting. If that does not help your vision, surgery is the only effective treatment. A cataract needs to be surgically removed when vision loss interferes with your everyday activities, such as driving, reading, or watching TV. A cataract may also have to be removed if it prevents examination or treatment of another eye problem. Surgery removes the cloudy lens and usually replaces it with a substitute lens (intraocular lens, IOL).  At a time when both you and your doctor agree, the cataract will be surgically removed. If you have cataracts in both eyes, only one is usually removed at a time. This allows the operated eye to heal and be out of danger from any possible problems after surgery (such as infection or poor wound healing). In rare cases, a cataract may be doing damage to your eye. In these cases, your caregiver may advise surgical removal right away. The vast majority of people who have cataract surgery have better vision afterward. HOME CARE INSTRUCTIONS  If you are not planning surgery, you may be asked to do the following:  Use different eyeglasses.   Use stronger or brighter lighting.   Ask your eye doctor about reducing  your medicine dose or changing medicines if it is thought that a medicine caused your cataract. Changing medicines does not make the cataract go away on its own.   Become familiar with your surroundings. Poor vision can lead to injury. Avoid bumping into things on the affected side. You are at a higher risk for tripping or falling.   Exercise extreme care when driving or operating machinery.   Wear sunglasses if you are sensitive to bright light or experiencing problems with glare.  SEEK IMMEDIATE MEDICAL CARE IF:   You have  a worsening or sudden vision loss.   You notice redness, swelling, or increasing pain in the eye.   You have a fever.  Document Released: 03/02/2005 Document Revised: 02/19/2011 Document Reviewed: 10/24/2010 [facility] Patient Information 2012 [place].PATIENT INSTRUCTIONS POST-ANESTHESIA  IMMEDIATELY FOLLOWING SURGERY:  Do not drive or operate machinery for the first twenty four hours after surgery.  Do not make any important decisions for twenty four hours after surgery or while taking narcotic pain medications or sedatives.  If you develop intractable nausea and vomiting or a severe headache please notify your doctor immediately.  FOLLOW-UP:  Please make an appointment with your surgeon as instructed. You do not need to follow up with anesthesia unless specifically instructed to do so.  WOUND CARE INSTRUCTIONS (if applicable):  Keep a dry clean dressing on the anesthesia/puncture wound site if there is drainage.  Once the wound has quit draining you may leave it open to air.  Generally you should leave the bandage intact for twenty four hours unless there is drainage.  If the epidural site drains for more than 36-48 hours please call the anesthesia department.  QUESTIONS?:  Please feel free to call your physician or the hospital operator if you have any questions, and they will be happy to assist you.

## 2016-01-31 ENCOUNTER — Encounter (HOSPITAL_COMMUNITY): Payer: Self-pay

## 2016-01-31 ENCOUNTER — Encounter (HOSPITAL_COMMUNITY)
Admission: RE | Admit: 2016-01-31 | Discharge: 2016-01-31 | Disposition: A | Discharge status: Home / Self Care | Payer: Medicare Other | Source: Ambulatory Visit | Attending: Ophthalmology | Admitting: Ophthalmology

## 2016-01-31 DIAGNOSIS — Z01812 Encounter for preprocedural laboratory examination: Secondary | ICD-10-CM | POA: Insufficient documentation

## 2016-01-31 HISTORY — DX: Sleep apnea, unspecified: G47.30

## 2016-01-31 LAB — CBC WITH DIFFERENTIAL/PLATELET
BASOS ABS: 0.1 10*3/uL (ref 0.0–0.1)
BASOS PCT: 1 %
EOS PCT: 3 %
Eosinophils Absolute: 0.3 10*3/uL (ref 0.0–0.7)
HCT: 36.5 % (ref 36.0–46.0)
Hemoglobin: 12.1 g/dL (ref 12.0–15.0)
Lymphocytes Relative: 26 %
Lymphs Abs: 2.3 10*3/uL (ref 0.7–4.0)
MCH: 28.7 pg (ref 26.0–34.0)
MCHC: 33.2 g/dL (ref 30.0–36.0)
MCV: 86.7 fL (ref 78.0–100.0)
MONO ABS: 0.8 10*3/uL (ref 0.1–1.0)
Monocytes Relative: 9 %
Neutro Abs: 5.5 10*3/uL (ref 1.7–7.7)
Neutrophils Relative %: 61 %
PLATELETS: 265 10*3/uL (ref 150–400)
RBC: 4.21 MIL/uL (ref 3.87–5.11)
RDW: 12.9 % (ref 11.5–15.5)
WBC: 8.8 10*3/uL (ref 4.0–10.5)

## 2016-01-31 LAB — BASIC METABOLIC PANEL
ANION GAP: 8 (ref 5–15)
BUN: 10 mg/dL (ref 6–20)
CALCIUM: 9.5 mg/dL (ref 8.9–10.3)
CO2: 29 mmol/L (ref 22–32)
Chloride: 101 mmol/L (ref 101–111)
Creatinine, Ser: 0.72 mg/dL (ref 0.44–1.00)
Glucose, Bld: 102 mg/dL — ABNORMAL HIGH (ref 65–99)
POTASSIUM: 3.6 mmol/L (ref 3.5–5.1)
SODIUM: 138 mmol/L (ref 135–145)

## 2016-02-03 ENCOUNTER — Other Ambulatory Visit (HOSPITAL_COMMUNITY): Payer: Self-pay

## 2016-02-04 ENCOUNTER — Other Ambulatory Visit (HOSPITAL_COMMUNITY): Payer: Self-pay

## 2016-02-04 ENCOUNTER — Other Ambulatory Visit: Payer: Self-pay

## 2016-02-04 ENCOUNTER — Ambulatory Visit (INDEPENDENT_AMBULATORY_CARE_PROVIDER_SITE_OTHER): Payer: Medicare Other

## 2016-02-04 DIAGNOSIS — I481 Persistent atrial fibrillation: Secondary | ICD-10-CM

## 2016-02-04 DIAGNOSIS — I4819 Other persistent atrial fibrillation: Secondary | ICD-10-CM

## 2016-02-05 ENCOUNTER — Telehealth: Payer: Self-pay | Admitting: *Deleted

## 2016-02-05 NOTE — Telephone Encounter (Signed)
Notes Recorded by Laurine Blazer, LPN on [id number] at 10:39 AM EST Left message to return call.  ------  Notes Recorded by Thompson Grayer, MD on 02/04/2016 at 4:43 PM EST Results reviewed. Claiborne Billings, please inform pt of result. I will route to primary care also.

## 2016-02-05 NOTE — Telephone Encounter (Signed)
Notes Recorded by Laurine Blazer, LPN on [id number] at 2:56 PM EST Patient notified and verbalized understanding. Coy to pmd.

## 2016-02-10 ENCOUNTER — Ambulatory Visit (HOSPITAL_COMMUNITY)
Admission: RE | Admit: 2016-02-10 | Discharge: 2016-02-10 | Disposition: A | Discharge status: Home / Self Care | Payer: Medicare Other | Source: Ambulatory Visit | Attending: Ophthalmology | Admitting: Ophthalmology

## 2016-02-10 ENCOUNTER — Ambulatory Visit (HOSPITAL_COMMUNITY): Payer: Medicare Other | Admitting: Anesthesiology

## 2016-02-10 ENCOUNTER — Encounter (HOSPITAL_COMMUNITY): Payer: Self-pay | Admitting: *Deleted

## 2016-02-10 ENCOUNTER — Encounter (HOSPITAL_COMMUNITY)
Admission: RE | Disposition: A | Discharge status: Home / Self Care | Payer: Self-pay | Source: Ambulatory Visit | Attending: Ophthalmology

## 2016-02-10 DIAGNOSIS — H25812 Combined forms of age-related cataract, left eye: Secondary | ICD-10-CM | POA: Diagnosis not present

## 2016-02-10 DIAGNOSIS — K449 Diaphragmatic hernia without obstruction or gangrene: Secondary | ICD-10-CM | POA: Diagnosis not present

## 2016-02-10 DIAGNOSIS — M1990 Unspecified osteoarthritis, unspecified site: Secondary | ICD-10-CM | POA: Diagnosis not present

## 2016-02-10 DIAGNOSIS — I2510 Atherosclerotic heart disease of native coronary artery without angina pectoris: Secondary | ICD-10-CM | POA: Insufficient documentation

## 2016-02-10 DIAGNOSIS — Z79899 Other long term (current) drug therapy: Secondary | ICD-10-CM | POA: Diagnosis not present

## 2016-02-10 DIAGNOSIS — I509 Heart failure, unspecified: Secondary | ICD-10-CM | POA: Insufficient documentation

## 2016-02-10 DIAGNOSIS — Z961 Presence of intraocular lens: Secondary | ICD-10-CM | POA: Diagnosis not present

## 2016-02-10 DIAGNOSIS — K219 Gastro-esophageal reflux disease without esophagitis: Secondary | ICD-10-CM | POA: Diagnosis not present

## 2016-02-10 DIAGNOSIS — G4730 Sleep apnea, unspecified: Secondary | ICD-10-CM | POA: Insufficient documentation

## 2016-02-10 DIAGNOSIS — E039 Hypothyroidism, unspecified: Secondary | ICD-10-CM | POA: Diagnosis not present

## 2016-02-10 DIAGNOSIS — I110 Hypertensive heart disease with heart failure: Secondary | ICD-10-CM | POA: Diagnosis not present

## 2016-02-10 DIAGNOSIS — H2512 Age-related nuclear cataract, left eye: Secondary | ICD-10-CM | POA: Diagnosis not present

## 2016-02-10 HISTORY — PX: CATARACT EXTRACTION W/PHACO: SHX586

## 2016-02-10 SURGERY — PHACOEMULSIFICATION, CATARACT, WITH IOL INSERTION
Anesthesia: Monitor Anesthesia Care | Site: Eye | Laterality: Left

## 2016-02-10 MED ORDER — TETRACAINE HCL 0.5 % OP SOLN
1.0000 [drp] | OPHTHALMIC | Status: AC
  Administered 2016-02-10 (×3): 1 [drp] via OPHTHALMIC

## 2016-02-10 MED ORDER — MIDAZOLAM HCL 2 MG/2ML IJ SOLN
1.0000 mg | INTRAMUSCULAR | Status: DC | PRN
  Administered 2016-02-10: 2 mg via INTRAVENOUS

## 2016-02-10 MED ORDER — PHENYLEPHRINE HCL 2.5 % OP SOLN
1.0000 [drp] | OPHTHALMIC | Status: AC
  Administered 2016-02-10 (×3): 1 [drp] via OPHTHALMIC

## 2016-02-10 MED ORDER — LIDOCAINE HCL (PF) 1 % IJ SOLN
INTRAMUSCULAR | Status: DC | PRN
  Administered 2016-02-10: .6 mL

## 2016-02-10 MED ORDER — PROVISC 10 MG/ML IO SOLN
INTRAOCULAR | Status: DC | PRN
  Administered 2016-02-10: 0.85 mL via INTRAOCULAR

## 2016-02-10 MED ORDER — EPINEPHRINE PF 1 MG/ML IJ SOLN
INTRAMUSCULAR | Status: AC
  Filled 2016-02-10: qty 1

## 2016-02-10 MED ORDER — FENTANYL CITRATE (PF) 100 MCG/2ML IJ SOLN
25.0000 ug | INTRAMUSCULAR | Status: DC | PRN
  Administered 2016-02-10: 25 ug via INTRAVENOUS

## 2016-02-10 MED ORDER — BSS IO SOLN
INTRAOCULAR | Status: DC | PRN
  Administered 2016-02-10: 15 mL via INTRAOCULAR

## 2016-02-10 MED ORDER — MIDAZOLAM HCL 2 MG/2ML IJ SOLN
INTRAMUSCULAR | Status: AC
  Filled 2016-02-10: qty 2

## 2016-02-10 MED ORDER — NEOMYCIN-POLYMYXIN-DEXAMETH 3.5-10000-0.1 OP SUSP
OPHTHALMIC | Status: DC | PRN
  Administered 2016-02-10: 2 [drp] via OPHTHALMIC

## 2016-02-10 MED ORDER — BSS IO SOLN
INTRAOCULAR | Status: DC | PRN
  Administered 2016-02-10: 500 mL

## 2016-02-10 MED ORDER — CYCLOPENTOLATE-PHENYLEPHRINE 0.2-1 % OP SOLN
1.0000 [drp] | OPHTHALMIC | Status: AC
  Administered 2016-02-10 (×3): 1 [drp] via OPHTHALMIC

## 2016-02-10 MED ORDER — FENTANYL CITRATE (PF) 100 MCG/2ML IJ SOLN
INTRAMUSCULAR | Status: AC
  Filled 2016-02-10: qty 2

## 2016-02-10 MED ORDER — LACTATED RINGERS IV SOLN
INTRAVENOUS | Status: DC
  Administered 2016-02-10: 1000 mL via INTRAVENOUS

## 2016-02-10 MED ORDER — LIDOCAINE HCL 3.5 % OP GEL
1.0000 "application " | Freq: Once | OPHTHALMIC | Status: AC
  Administered 2016-02-10: 1 via OPHTHALMIC

## 2016-02-10 MED ORDER — POVIDONE-IODINE 5 % OP SOLN
OPHTHALMIC | Status: DC | PRN
  Administered 2016-02-10: 1 via OPHTHALMIC

## 2016-02-10 MED ORDER — LIDOCAINE 3.5 % OP GEL OPTIME - NO CHARGE
OPHTHALMIC | Status: DC | PRN
  Administered 2016-02-10: 2 [drp] via OPHTHALMIC

## 2016-02-10 SURGICAL SUPPLY — 23 items
CAPSULAR TENSION RING-AMO (OPHTHALMIC RELATED) IMPLANT
CLOTH BEACON ORANGE TIMEOUT ST (SAFETY) ×2 IMPLANT
EYE SHIELD UNIVERSAL CLEAR (GAUZE/BANDAGES/DRESSINGS) ×2 IMPLANT
GLOVE BIOGEL PI IND STRL 6.5 (GLOVE) IMPLANT
GLOVE BIOGEL PI IND STRL 7.0 (GLOVE) IMPLANT
GLOVE BIOGEL PI INDICATOR 6.5 (GLOVE) ×2
GLOVE BIOGEL PI INDICATOR 7.0 (GLOVE)
GLOVE EXAM NITRILE LRG STRL (GLOVE) IMPLANT
GLOVE EXAM NITRILE MD LF STRL (GLOVE) ×2 IMPLANT
KIT VITRECTOMY (OPHTHALMIC RELATED) IMPLANT
PAD ARMBOARD 7.5X6 YLW CONV (MISCELLANEOUS) ×2 IMPLANT
PROC W NO LENS (INTRAOCULAR LENS)
PROC W SPEC LENS (INTRAOCULAR LENS)
PROCESS W NO LENS (INTRAOCULAR LENS) IMPLANT
PROCESS W SPEC LENS (INTRAOCULAR LENS) IMPLANT
RETRACTOR IRIS SIGHTPATH (OPHTHALMIC RELATED) IMPLANT
RING MALYGIN (MISCELLANEOUS) IMPLANT
SIGHTPATH CAT PROC W REG LENS (Ophthalmic Related) ×3 IMPLANT
SYRINGE LUER LOK 1CC (MISCELLANEOUS) ×2 IMPLANT
TAPE SURG TRANSPORE 1 IN (GAUZE/BANDAGES/DRESSINGS) IMPLANT
TAPE SURGICAL TRANSPORE 1 IN (GAUZE/BANDAGES/DRESSINGS) ×2
VISCOELASTIC ADDITIONAL (OPHTHALMIC RELATED) IMPLANT
WATER STERILE IRR 250ML POUR (IV SOLUTION) ×2 IMPLANT

## 2016-02-10 NOTE — Anesthesia Procedure Notes (Signed)
Procedure Name: MAC Date/Time: 02/10/2016 10:30 AM Performed by: Vista Deck Pre-anesthesia Checklist: Patient identified, Emergency Drugs available, Suction available, Timeout performed and Patient being monitored Patient Re-evaluated:Patient Re-evaluated prior to inductionOxygen Delivery Method: Nasal Cannula

## 2016-02-10 NOTE — Discharge Instructions (Signed)
Anesthesia, Adult, Care After °These instructions provide you with information about caring for yourself after your procedure. Your health care provider may also give you more specific instructions. Your treatment has been planned according to current medical practices, but problems sometimes occur. Call your health care provider if you have any problems or questions after your procedure. °What can I expect after the procedure? °After the procedure, it is common to have: °· Vomiting. °· A sore throat. °· Mental slowness. °It is common to feel: °· Nauseous. °· Cold or shivery. °· Sleepy. °· Tired. °· Sore or achy, even in parts of your body where you did not have surgery. °Follow these instructions at home: °For at least 24 hours after the procedure: °· Do not: °¨ Participate in activities where you could fall or become injured. °¨ Drive. °¨ Use heavy machinery. °¨ Drink alcohol. °¨ Take sleeping pills or medicines that cause drowsiness. °¨ Make important decisions or sign legal documents. °¨ Take care of children on your own. °· Rest. °Eating and drinking °· If you vomit, drink water, juice, or soup when you can drink without vomiting. °· Drink enough fluid to keep your urine clear or pale yellow. °· Make sure you have little or no nausea before eating solid foods. °· Follow the diet recommended by your health care provider. °General instructions °· Have a responsible adult stay with you until you are awake and alert. °· Return to your normal activities as told by your health care provider. Ask your health care provider what activities are safe for you. °· Take over-the-counter and prescription medicines only as told by your health care provider. °· If you smoke, do not smoke without supervision. °· Keep all follow-up visits as told by your health care provider. This is important. °Contact a health care provider if: °· You continue to have nausea or vomiting at home, and medicines are not helpful. °· You cannot  drink fluids or start eating again. °· You cannot urinate after 8-12 hours. °· You develop a skin rash. °· You have fever. °· You have increasing redness at the site of your procedure. °Get help right away if: °· You have difficulty breathing. °· You have chest pain. °· You have unexpected bleeding. °· You feel that you are having a life-threatening or urgent problem. °This information is not intended to replace advice given to you by your health care provider. Make sure you discuss any questions you have with your health care provider. °Document Released: 06/08/2000 Document Revised: 08/05/2015 Document Reviewed: 02/14/2015 °Elsevier Interactive Patient Education © 2017 Elsevier Inc. ° °

## 2016-02-10 NOTE — Op Note (Signed)
Date of Admission: 02/10/2016  Date of Surgery: 02/10/2016  Pre-Op Dx: Cataract Left  Eye  Post-Op Dx: Senile Combined Cataract  Left  Eye,  Dx Code [id number]  Surgeon: Tonny Branch, M.D.  Assistants: None  Anesthesia: Topical with MAC  Indications: Painless, progressive loss of vision with compromise of daily activities.  Surgery: Cataract Extraction with Intraocular lens Implant Left Eye  Discription: The patient had dilating drops and viscous lidocaine placed into the Left eye in the pre-op holding area. After transfer to the operating room, a time out was performed. The patient was then prepped and draped. Beginning with a 87 degree blade a paracentesis port was made at the surgeon's 2 o'clock position. The anterior chamber was then filled with 1% non-preserved lidocaine. This was followed by filling the anterior chamber with Provisc.  A 2.75mm keratome blade was used to make a clear corneal incision at the temporal limbus.  A bent cystatome needle was used to create a continuous tear capsulotomy. Hydrodissection was performed with balanced salt solution on a Fine canula. The lens nucleus was then removed using the phacoemulsification handpiece. Residual cortex was removed with the I&A handpiece. The anterior chamber and capsular bag were refilled with Provisc. A posterior chamber intraocular lens was placed into the capsular bag with it's injector. The implant was positioned with the Kuglan hook. The Provisc was then removed from the anterior chamber and capsular bag with the I&A handpiece. Stromal hydration of the main incision and paracentesis port was performed with BSS on a Fine canula. The wounds were tested for leak which was negative. The patient tolerated the procedure well. There were no operative complications. The patient was then transferred to the recovery room in stable condition.  Complications: None  Specimen: None  EBL: None  Prosthetic device: Abbott Technis, PCB00, power  16.0, SN [id number].

## 2016-02-10 NOTE — H&P (Signed)
I have reviewed the H&P, the patient was re-examined, and I have identified no interval changes in medical condition and plan of care since the history and physical of record  

## 2016-02-10 NOTE — Transfer of Care (Signed)
Immediate Anesthesia Transfer of Care Note  Patient: [name] Fry  Procedure(s) Performed: Procedure(s) (LRB): CATARACT EXTRACTION PHACO AND INTRAOCULAR LENS PLACEMENT LEFT EYE: CDE:  5.39 (Left)  Patient Location: Shortstay  Anesthesia Type: MAC  Level of Consciousness: awake  Airway & Oxygen Therapy: Patient Spontanous Breathing   Post-op Assessment: Report given to PACU RN, Post -op Vital signs reviewed and stable and Patient moving all extremities  Post vital signs: Reviewed and stable  Complications: No apparent anesthesia complications

## 2016-02-10 NOTE — Anesthesia Postprocedure Evaluation (Signed)
  Anesthesia Post-op Note  Patient: [name] Fry  Procedure(s) Performed: Procedure(s) (LRB): CATARACT EXTRACTION PHACO AND INTRAOCULAR LENS PLACEMENT LEFT EYE: CDE:  5.39 (Left)  Patient Location:  Short Stay  Anesthesia Type: MAC  Level of Consciousness: awake  Airway and Oxygen Therapy: Patient Spontanous Breathing  Post-op Pain: none  Post-op Assessment: Post-op Vital signs reviewed, Patient's Cardiovascular Status Stable, Respiratory Function Stable, Patent Airway, No signs of Nausea or vomiting and Pain level controlled  Post-op Vital Signs: Reviewed and stable  Complications: No apparent anesthesia complications Anesthesia Post Note  Patient: [name] Fry  Procedure(s) Performed: Procedure(s) (LRB): CATARACT EXTRACTION PHACO AND INTRAOCULAR LENS PLACEMENT LEFT EYE: CDE:  5.39 (Left)  Anesthesia Post Evaluation  Last Vitals:  Vitals:   02/10/16 1015  BP: (!) 196/95  Resp: (!) 24    Last Pain: There were no vitals filed for this visit.               Drucie Opitz

## 2016-02-10 NOTE — Anesthesia Preprocedure Evaluation (Signed)
Anesthesia Evaluation  Patient identified by MRN, date of birth, ID band Patient awake    Reviewed: Allergy & Precautions, NPO status , Patient's Chart, lab work & pertinent test results  History of Anesthesia Complications Negative for: history of anesthetic complications  Airway Mallampati: III  TM Distance: >3 FB Neck ROM: Full    Dental  (+) Teeth Intact   Pulmonary shortness of breath, sleep apnea , pneumonia,    breath sounds clear to auscultation       Cardiovascular hypertension, Pt. on medications + CAD and +CHF  + dysrhythmias  Rhythm:Irregular     Neuro/Psych negative neurological ROS  negative psych ROS   GI/Hepatic hiatal hernia, GERD  ,  Endo/Other  Hypothyroidism   Renal/GU      Musculoskeletal  (+) Arthritis ,   Abdominal   Peds  Hematology   Anesthesia Other Findings   Reproductive/Obstetrics                             Anesthesia Physical Anesthesia Plan  ASA: III  Anesthesia Plan: MAC   Post-op Pain Management:    Induction: Intravenous  Airway Management Planned: Nasal Cannula  Additional Equipment:   Intra-op Plan:   Post-operative Plan:   Informed Consent: I have reviewed the patients History and Physical, chart, labs and discussed the procedure including the risks, benefits and alternatives for the proposed anesthesia with the patient or authorized representative who has indicated his/her understanding and acceptance.     Plan Discussed with:   Anesthesia Plan Comments:         Anesthesia Quick Evaluation

## 2016-02-13 ENCOUNTER — Encounter (HOSPITAL_COMMUNITY): Payer: Self-pay | Admitting: Ophthalmology

## 2016-02-14 ENCOUNTER — Ambulatory Visit (INDEPENDENT_AMBULATORY_CARE_PROVIDER_SITE_OTHER): Payer: Medicare Other | Admitting: Internal Medicine

## 2016-02-14 ENCOUNTER — Ambulatory Visit: Payer: Self-pay | Admitting: Internal Medicine

## 2016-02-14 ENCOUNTER — Encounter: Payer: Self-pay | Admitting: Internal Medicine

## 2016-02-14 VITALS — BP 167/89 | HR 61 | Ht 63.5 in | Wt 190.2 lb

## 2016-02-14 DIAGNOSIS — I4819 Other persistent atrial fibrillation: Secondary | ICD-10-CM

## 2016-02-14 DIAGNOSIS — I4891 Unspecified atrial fibrillation: Secondary | ICD-10-CM | POA: Diagnosis not present

## 2016-02-14 DIAGNOSIS — I428 Other cardiomyopathies: Secondary | ICD-10-CM

## 2016-02-14 DIAGNOSIS — I481 Persistent atrial fibrillation: Secondary | ICD-10-CM | POA: Diagnosis not present

## 2016-02-14 DIAGNOSIS — I10 Essential (primary) hypertension: Secondary | ICD-10-CM

## 2016-02-14 MED ORDER — LOSARTAN POTASSIUM 25 MG PO TABS: 25.0000 mg | ORAL_TABLET | Freq: Every day | ORAL | 3 refills | Status: DC

## 2016-02-14 NOTE — Progress Notes (Signed)
Electrophysiology Office Note   Date:  02/14/2016   ID:  BRUCE MARTINDALE, DOB 23-Mar-1945, MRN [id number]  PCP:  [name], MD  Cardiologist:  Dr Domenic Polite Primary Electrophysiologist: Thompson Grayer, MD    No chief complaint on file.    History of Present Illness: [name] Fry is a 70 y.o. female who presents today for electrophysiology evaluation.  She has done very well since her AF ablation.  Denies procedure related complications.  Remains in sinus rhythm.  EF has recovered. Today, she denies symptoms of palpitations, chest pain, orthopnea, PND, lower extremity edema, claudication, dizziness, presyncope, syncope, bleeding, or neurologic sequela. The patient is tolerating medications without difficulties and is otherwise without complaint today.    Past Medical History:  Diagnosis Date  . Arthritis   . Atrial fibrillation [facility])    Documented January 2017  . Breast cancer ([place])    Left  . Cataracts, bilateral   . Coronary atherosclerosis of native coronary artery    DES OM 3/12  . Dysrhythmia    AFib  . Essential hypertension, benign   . GERD (gastroesophageal reflux disease)   . H/O hiatal hernia   . Hyperlipidemia   . Hypothyroidism   . Sleep apnea    mild and does not need treatment per pt   Past Surgical History:  Procedure Laterality Date  . [place] VITRECTOMY WITH 20 GAUGE MVR PORT Right 09/29/2012   Procedure: 25 GAUGE PARS PLANA VITRECTOMY WITH 20 GAUGE MVR PORT;  Surgeon: Hayden Pedro, MD;  Location: [place];  Service: Ophthalmology;  Laterality: Right;  . APPENDECTOMY    . CARDIAC CATHETERIZATION N/A 07/26/2015   Procedure: Left Heart Cath and Coronary Angiography;  Surgeon: [name], MD;  Location: [place] CV [facility];  Service: Cardiovascular;  Laterality: N/A;  . CARDIOVERSION N/A 08/30/2015   Procedure: CARDIOVERSION;  Surgeon: Satira Sark, MD;  [facility];  Service: Cardiovascular;  Laterality: N/A;  . CATARACT EXTRACTION  W/PHACO Right 05/18/2013   Procedure: CATARACT EXTRACTION PHACO AND INTRAOCULAR LENS PLACEMENT RIGHT EYE;  Surgeon: Tonny Branch, MD;  Location: [facility];  Service: Ophthalmology;  Laterality: Right;  CDE:13.35  . CATARACT EXTRACTION W/PHACO Left 02/10/2016   Procedure: CATARACT EXTRACTION PHACO AND INTRAOCULAR LENS PLACEMENT LEFT EYE: CDE:  5.39;  Surgeon: Tonny Branch, MD;  Location: [facility];  Service: Ophthalmology;  Laterality: Left;  . CHOLECYSTECTOMY    . ELECTROPHYSIOLOGIC STUDY N/A 11/19/2015   Procedure: Atrial Fibrillation Ablation;  Surgeon: Thompson Grayer, MD;  Location: [place] [facility];  Service: Cardiovascular;  Laterality: N/A;  . GAS/FLUID EXCHANGE Right 09/29/2012   Procedure: GAS/FLUID EXCHANGE;  Surgeon: Hayden Pedro, MD;  Location: [place];  Service: Ophthalmology;  Laterality: Right;  . LASER PHOTO ABLATION Right 09/29/2012   Procedure: LASER PHOTO ABLATION;  Surgeon: Hayden Pedro, MD;  Location: [place];  Service: Ophthalmology;  Laterality: Right;  Headscope laser  . MASTECTOMY Left    1997; S/P chemotherapy/radiation  . MEMBRANE PEEL Right 09/29/2012   Procedure: MEMBRANE PEEL;  Surgeon: Hayden Pedro, MD;  Location: [place];  Service: Ophthalmology;  Laterality: Right;  . TEE WITHOUT CARDIOVERSION N/A 11/19/2015   Procedure: TRANSESOPHAGEAL ECHOCARDIOGRAM (TEE);  Surgeon: Thayer Headings, MD;  Location: [place];  Service: Cardiovascular;  Laterality: N/A;  . TUBAL LIGATION       Current Outpatient Prescriptions  Medication Sig Dispense Refill  . acetaminophen (TYLENOL) 500 MG tablet Take 500-1,000 mg  by mouth every 6 (six) hours as needed for moderate pain or headache.    . digoxin (LANOXIN) 0.125 MG tablet Take 1 tablet (125 mcg total) by mouth daily. 90 tablet 1  . furosemide (LASIX) 40 MG tablet Take 1 tablet (40 mg total) by mouth daily as needed for fluid (or weight gain). 30 tablet 2  . levothyroxine (SYNTHROID, LEVOTHROID) 112 MCG tablet Take 112 mcg by mouth daily.      Marland Kitchen losartan (COZAAR) 25 MG tablet Take 0.5 tablets (12.5 mg total) by mouth daily. 15 tablet 6  . metoprolol succinate (TOPROL-XL) 25 MG 24 hr tablet Take 1 tablet (25 mg total) by mouth daily. 90 tablet 1  . Multiple Vitamins-Minerals (OCUVITE PRESERVISION PO) Take 1 tablet by mouth 2 (two) times daily.     Marland Kitchen NITROSTAT 0.4 MG SL tablet PLACE 1 TABLET UNDER THE TONGUE EVERY 5 MINUTES FOR 3 DOSES AS NEEDED CHEST PAIN 25 tablet 3  . Vitamin D, Cholecalciferol, 1000 units CAPS Take 1,000 Units by mouth daily.     Alveda Reasons 20 MG TABS tablet TAKE 1 TABLET BY MOUTH DAILY WITH SUPPER. 30 tablet 6   No current facility-administered medications for this visit.     Allergies:   Doxycycline   Social History:  The patient  reports that she has never smoked. She has never used smokeless tobacco. She reports that she does not drink alcohol or use drugs.   Family History:  The patient's  family history includes Heart attack (age of onset: 66) in her father.    ROS:  Please see the history of present illness.   All other systems are reviewed and negative.    PHYSICAL EXAM: VS:  BP (!) 167/89   Pulse 61   Ht 5' 3.5" (1.613 m)   Wt 190 lb 3.2 oz (86.3 kg)   LMP 09/28/2012   SpO2 99%   BMI 33.16 kg/m  , BMI Body mass index is 33.16 kg/m. GEN: Well nourished, well developed, in no acute distress  HEENT: normal  Neck: no JVD, carotid bruits, or masses Cardiac:RRR Respiratory:  clear to auscultation bilaterally, normal work of breathing GI: soft, nontender, nondistended, + BS MS: no deformity or atrophy  Skin: warm and dry  Neuro:  Strength and sensation are intact Psych: euthymic mood, full affect  EKG:  EKG is ordered today. The ekg ordered today shows sinus rhythm, LBBB   Recent Labs: 10/29/2015: ALT 22; B Natriuretic Peptide 1,555.0; TSH 5.133 10/30/2015: Magnesium 1.7 01/31/2016: BUN 10; Creatinine, Ser 0.72; Hemoglobin 12.1; Platelets 265; Potassium 3.6; Sodium 138    Lipid Panel   No results found for: CHOL, TRIG, HDL, CHOLHDL, VLDL, LDLCALC, LDLDIRECT   Wt Readings from Last 3 Encounters:  02/14/16 190 lb 3.2 oz (86.3 kg)  01/31/16 194 lb (88 kg)  01/02/16 195 lb (88.5 kg)      Other studies Reviewed: Additional studies/ records that were reviewed today include: Dr McDowell's notes, recent echo and sleep study Review of the above records today demonstrates: as above   ASSESSMENT AND PLAN:  1.  Persistent atrial fibrillation Doing very well s/p ablation EF has recovered Maintaining sinus rhythm off of AAD therapy Continue xarelto for chads2vasc score of at least 5  2. Nonischemic CM EF has recovered Increase losartan to 25mg  daily Stop digoxin  3. Mild OSA Study reviewed No therapy advised  4. Hypertensive cardiovascular disease Elevated BP today Increase losartan to 25mg  daily.  This medicine has been  shown to reduce atrial fibrosis related to afib.  Would therefore aggressive titration of losartan going forward.   5. Overweight She has lost 8 lbs since July Lifestyle modification again encouraged today  Follow-up with Dr Domenic Polite as scheduled I will see again in 3 months   Signed, Thompson Grayer, MD  02/14/2016 10:00 AM     [facility] [address] [place] [address] [phone] (office) [phone] (fax)

## 2016-02-14 NOTE — Patient Instructions (Signed)
Medication Instructions:   Increase Losartan to 25mg  daily (whole tab).  Stop Digoxin.  Continue all other medications.    Labwork: none  Testing/Procedures: none  Follow-Up: 3 months  Any Other Special Instructions Will Be Listed Below (If Applicable).  If you need a refill on your cardiac medications before your next appointment, please call your pharmacy.

## 2016-02-17 ENCOUNTER — Ambulatory Visit: Payer: Self-pay | Admitting: Internal Medicine

## 2016-02-17 NOTE — Addendum Note (Signed)
Addended by: Laurine Blazer on: 02/17/2016 09:31 AM   Modules accepted: Orders

## 2016-02-21 DIAGNOSIS — Z1231 Encounter for screening mammogram for malignant neoplasm of breast: Secondary | ICD-10-CM | POA: Diagnosis not present

## 2016-03-02 DIAGNOSIS — Z961 Presence of intraocular lens: Secondary | ICD-10-CM | POA: Diagnosis not present

## 2016-03-02 DIAGNOSIS — H25812 Combined forms of age-related cataract, left eye: Secondary | ICD-10-CM | POA: Diagnosis not present

## 2016-04-02 ENCOUNTER — Ambulatory Visit: Payer: Self-pay | Admitting: Cardiology

## 2016-04-11 DIAGNOSIS — I10 Essential (primary) hypertension: Secondary | ICD-10-CM | POA: Diagnosis not present

## 2016-04-11 DIAGNOSIS — R002 Palpitations: Secondary | ICD-10-CM | POA: Diagnosis not present

## 2016-04-11 DIAGNOSIS — R079 Chest pain, unspecified: Secondary | ICD-10-CM | POA: Diagnosis not present

## 2016-04-11 DIAGNOSIS — R0602 Shortness of breath: Secondary | ICD-10-CM | POA: Diagnosis not present

## 2016-04-11 DIAGNOSIS — R531 Weakness: Secondary | ICD-10-CM | POA: Diagnosis not present

## 2016-04-20 NOTE — Progress Notes (Signed)
Cardiology Office Note  Date: 04/22/2016   ID: ZAILY WRITT, DOB [birth year], MRN [id number]  PCP: [name], MD  Primary Cardiologist: Rozann Lesches, MD   Chief Complaint  Patient presents with  . Atrial Fibrillation    History of Present Illness: [name] Fry is a 71 y.o. female last seen in October 2017. She presents for a routine follow-up visit. Reports no chest pain. States that she did have an episode of feeling forceful heartbeat, was reportedly seen at [facility] and found to be hypertensive resulting in up titration of her antihypertensive regimen. She is feeling better. Blood pressure control is reasonable today.  I reviewed her ECG today which shows normal sinus rhythm with left bundle branch block which is old.  Current cardiac regimen includes Xarelto, Toprol-XL, Hyzaar, and as needed nitroglycerin.  Follow-up echocardiogram from November 2017 showed normalization of LVEF.   Past Medical History:  Diagnosis Date  . Arthritis   . Atrial fibrillation [facility])    Documented January 2017  . Breast cancer ([place])    Left  . Cataracts, bilateral   . Coronary atherosclerosis of native coronary artery    DES OM 3/12  . Essential hypertension, benign   . GERD (gastroesophageal reflux disease)   . H/O hiatal hernia   . Hyperlipidemia   . Hypothyroidism   . Sleep apnea     Past Surgical History:  Procedure Laterality Date  . [place] VITRECTOMY WITH 20 GAUGE MVR PORT Right 09/29/2012   Procedure: 25 GAUGE PARS PLANA VITRECTOMY WITH 20 GAUGE MVR PORT;  Surgeon: Hayden Pedro, MD;  Location: [place];  Service: Ophthalmology;  Laterality: Right;  . APPENDECTOMY    . CARDIAC CATHETERIZATION N/A 07/26/2015   Procedure: Left Heart Cath and Coronary Angiography;  Surgeon: [name], MD;  Location: [place] CV LAB;  Service: Cardiovascular;  Laterality: N/A;  . CARDIOVERSION N/A 08/30/2015   Procedure: CARDIOVERSION;  Surgeon: Satira Sark, MD;   [facility];  Service: Cardiovascular;  Laterality: N/A;  . CATARACT EXTRACTION W/PHACO Right 05/18/2013   Procedure: CATARACT EXTRACTION PHACO AND INTRAOCULAR LENS PLACEMENT RIGHT EYE;  Surgeon: Tonny Branch, MD;  Location: [facility];  Service: Ophthalmology;  Laterality: Right;  CDE:13.35  . CATARACT EXTRACTION W/PHACO Left 02/10/2016   Procedure: CATARACT EXTRACTION PHACO AND INTRAOCULAR LENS PLACEMENT LEFT EYE: CDE:  5.39;  Surgeon: Tonny Branch, MD;  Location: [facility];  Service: Ophthalmology;  Laterality: Left;  . CHOLECYSTECTOMY    . ELECTROPHYSIOLOGIC STUDY N/A 11/19/2015   Procedure: Atrial Fibrillation Ablation;  Surgeon: Thompson Grayer, MD;  Location: [place] CV LAB;  Service: Cardiovascular;  Laterality: N/A;  . GAS/FLUID EXCHANGE Right 09/29/2012   Procedure: GAS/FLUID EXCHANGE;  Surgeon: Hayden Pedro, MD;  Location: [place];  Service: Ophthalmology;  Laterality: Right;  . LASER PHOTO ABLATION Right 09/29/2012   Procedure: LASER PHOTO ABLATION;  Surgeon: Hayden Pedro, MD;  Location: [place];  Service: Ophthalmology;  Laterality: Right;  Headscope laser  . MASTECTOMY Left    1997; S/P chemotherapy/radiation  . MEMBRANE PEEL Right 09/29/2012   Procedure: MEMBRANE PEEL;  Surgeon: Hayden Pedro, MD;  Location: [place];  Service: Ophthalmology;  Laterality: Right;  . TEE WITHOUT CARDIOVERSION N/A 11/19/2015   Procedure: TRANSESOPHAGEAL ECHOCARDIOGRAM (TEE);  Surgeon: Thayer Headings, MD;  Location: [place];  Service: Cardiovascular;  Laterality: N/A;  . TUBAL LIGATION      Current Outpatient Prescriptions  Medication Sig Dispense  Refill  . acetaminophen (TYLENOL) 500 MG tablet Take 500-1,000 mg by mouth every 6 (six) hours as needed for moderate pain or headache.    . furosemide (LASIX) 40 MG tablet Take 1 tablet (40 mg total) by mouth daily as needed for fluid (or weight gain). 30 tablet 2  . GARLIC OIL PO Take 1 capsule by mouth daily.    Marland Kitchen levothyroxine (SYNTHROID,  LEVOTHROID) 112 MCG tablet Take 112 mcg by mouth daily.     Marland Kitchen losartan-hydrochlorothiazide (HYZAAR) 50-12.5 MG tablet Take 1 tablet by mouth daily.    . metoprolol succinate (TOPROL-XL) 25 MG 24 hr tablet Take 1 tablet (25 mg total) by mouth daily. 90 tablet 1  . Multiple Vitamins-Minerals (OCUVITE PRESERVISION PO) Take 1 tablet by mouth 2 (two) times daily.     Marland Kitchen NITROSTAT 0.4 MG SL tablet PLACE 1 TABLET UNDER THE TONGUE EVERY 5 MINUTES FOR 3 DOSES AS NEEDED CHEST PAIN 25 tablet 3  . Omega-3 Fatty Acids (FISH OIL) 1000 MG CAPS Take 1 capsule by mouth daily.    . Vitamin D, Cholecalciferol, 1000 units CAPS Take 1,000 Units by mouth daily.     Alveda Reasons 20 MG TABS tablet TAKE 1 TABLET BY MOUTH DAILY WITH SUPPER. 30 tablet 6   No current facility-administered medications for this visit.    Allergies:  Doxycycline   Social History: The patient  reports that she has never smoked. She has never used smokeless tobacco. She reports that she does not drink alcohol or use drugs.   ROS:  Please see the history of present illness. Otherwise, complete review of systems is positive for none.  All other systems are reviewed and negative.   Physical Exam: VS:  BP 130/77   Pulse 71   Ht 5\' 3"  (1.6 m)   Wt 196 lb (88.9 kg)   LMP 09/28/2012   BMI 34.72 kg/m , BMI Body mass index is 34.72 kg/m.  Wt Readings from Last 3 Encounters:  04/22/16 196 lb (88.9 kg)  02/14/16 190 lb 3.2 oz (86.3 kg)  01/31/16 194 lb (88 kg)    Overweight woman, no distress.Marland Kitchen  HEENT: Conjunctiva and lids normal, oropharynx with moist mucosa.  Neck: Supple, no elevated JVP or bruits.  Lungs: Clear to auscultation, nonlabored.  Cardiac: RRR, no S3.  Abdomen: Soft, nontender, bowel sounds present.  Skin: Warm and dry.  Extremities: Trace legedema. Musculoskeletal: No kyphosis. Neuropsychiatric: Alert and oriented 3, affect appropriate.  ECG: I personally reviewed the tracing from 02/14/2016 which showed sinus  bradycardia with left bundle branch block.  Recent Labwork: 10/29/2015: ALT 22; AST 22; B Natriuretic Peptide 1,555.0; TSH 5.133 10/30/2015: Magnesium 1.7 01/31/2016: BUN 10; Creatinine, Ser 0.72; Hemoglobin 12.1; Platelets 265; Potassium 3.6; Sodium 138   Other Studies Reviewed Today:  Echocardiogram 02/04/2016: Study Conclusions  - Left ventricle: The cavity size was normal. Wall thickness was   increased in a pattern of mild LVH. Systolic function was normal.   The estimated ejection fraction was in the range of 55% to 60%.   Diastolic function is abnormal, indeterminate grade. There is   evidence of elevated LA pressure. Wall motion was normal; there   were no regional wall motion abnormalities. - Aortic valve: Mildly calcified annulus. Trileaflet; normal   thickness leaflets. Valve area (VTI): 1.88 cm^2. Valve area   (Vmax): 1.78 cm^2. Valve area (Vmean): 1.7 cm^2. - Mitral valve: Mildly calcified annulus. Mildly thickened leaflets   . There was mild regurgitation. - Technically  adequate study.  Assessment and Plan:  1. Paroxysmal atrial fibrillation status post ablation, has done well overall. I reviewed her ECG today. Plan to continue current medical regimen including Xarelto.  2. Nonischemic cardiomyopathy with improvement in LVEF to the range of 55-60%. Continue medical therapy.  3. CAD status post DES Is marginal in 2012. This was patent and follow-up angiography in May of last year.  4. Essential hypertension, switched from Cozaar to Hyzaar which she is tolerating with better blood pressure control.  Current medicines were reviewed with the patient today.   Orders Placed This Encounter  Procedures  . EKG 12-Lead    Disposition: Follow-up in 6 months.  Signed, Satira Sark, MD, [facility] 04/22/2016 2:23 PM    [facility] at [place], [place], [place] [address] Phone: [phone]; Fax: [phone]

## 2016-04-22 ENCOUNTER — Encounter: Payer: Self-pay | Admitting: Cardiology

## 2016-04-22 ENCOUNTER — Ambulatory Visit (INDEPENDENT_AMBULATORY_CARE_PROVIDER_SITE_OTHER): Payer: Medicare Other | Admitting: Cardiology

## 2016-04-22 VITALS — BP 130/77 | HR 71 | Ht 63.0 in | Wt 196.0 lb

## 2016-04-22 DIAGNOSIS — I480 Paroxysmal atrial fibrillation: Secondary | ICD-10-CM | POA: Diagnosis not present

## 2016-04-22 DIAGNOSIS — I10 Essential (primary) hypertension: Secondary | ICD-10-CM

## 2016-04-22 DIAGNOSIS — I429 Cardiomyopathy, unspecified: Secondary | ICD-10-CM | POA: Diagnosis not present

## 2016-04-22 DIAGNOSIS — I2510 Atherosclerotic heart disease of native coronary artery without angina pectoris: Secondary | ICD-10-CM

## 2016-04-22 NOTE — Patient Instructions (Signed)
Your physician wants you to follow-up in: 6 months with Dr. Mcdowell. You will receive a reminder letter in the mail two months in advance. If you don't receive a letter, please call our office to schedule the follow-up appointment.   Your physician recommends that you continue on your current medications as directed. Please refer to the Current Medication list given to you today.    Thank you for choosing [facility]!!       

## 2016-05-05 ENCOUNTER — Ambulatory Visit: Payer: Self-pay | Admitting: Cardiology

## 2016-05-22 ENCOUNTER — Encounter: Payer: Self-pay | Admitting: Internal Medicine

## 2016-05-22 ENCOUNTER — Ambulatory Visit (INDEPENDENT_AMBULATORY_CARE_PROVIDER_SITE_OTHER): Payer: Medicare Other | Admitting: Internal Medicine

## 2016-05-22 VITALS — BP 123/73 | HR 70 | Ht 63.0 in | Wt 197.4 lb

## 2016-05-22 DIAGNOSIS — I428 Other cardiomyopathies: Secondary | ICD-10-CM | POA: Diagnosis not present

## 2016-05-22 DIAGNOSIS — I481 Persistent atrial fibrillation: Secondary | ICD-10-CM

## 2016-05-22 DIAGNOSIS — I4819 Other persistent atrial fibrillation: Secondary | ICD-10-CM

## 2016-05-22 DIAGNOSIS — I10 Essential (primary) hypertension: Secondary | ICD-10-CM

## 2016-05-22 MED ORDER — METOPROLOL SUCCINATE ER 25 MG PO TB24: 25.0000 mg | ORAL_TABLET | Freq: Every day | ORAL | 3 refills | Status: DC

## 2016-05-22 NOTE — Patient Instructions (Signed)

## 2016-05-22 NOTE — Progress Notes (Signed)
Electrophysiology Office Note   Date:  05/22/2016   ID:  Ramsha, Lonigro [birth year], MRN [id number]  PCP:  [name], MD  Cardiologist:  Dr Domenic Polite Primary Electrophysiologist: Thompson Grayer, MD    CC: afib   History of Present Illness: [name] Fry is a 71 y.o. female who presents today for electrophysiology evaluation.  She has done very well since her AF ablation.    Remains in sinus rhythm.  EF has recovered.  Only concern today is with chronic back pain. Today, she denies symptoms of palpitations, chest pain, orthopnea, PND, lower extremity edema, claudication, dizziness, presyncope, syncope, bleeding, or neurologic sequela. The patient is tolerating medications without difficulties and is otherwise without complaint today.    Past Medical History:  Diagnosis Date  . Arthritis   . Atrial fibrillation [facility])    Documented January 2017  . Breast cancer ([place])    Left  . Cataracts, bilateral   . Coronary atherosclerosis of native coronary artery    DES OM 3/12  . Essential hypertension, benign   . GERD (gastroesophageal reflux disease)   . H/O hiatal hernia   . Hyperlipidemia   . Hypothyroidism   . Sleep apnea    Past Surgical History:  Procedure Laterality Date  . [place] VITRECTOMY WITH 20 GAUGE MVR PORT Right 09/29/2012   Procedure: 25 GAUGE PARS PLANA VITRECTOMY WITH 20 GAUGE MVR PORT;  Surgeon: Hayden Pedro, MD;  Location: [place];  Service: Ophthalmology;  Laterality: Right;  . APPENDECTOMY    . CARDIAC CATHETERIZATION N/A 07/26/2015   Procedure: Left Heart Cath and Coronary Angiography;  Surgeon: [name], MD;  Location: [place] CV LAB;  Service: Cardiovascular;  Laterality: N/A;  . CARDIOVERSION N/A 08/30/2015   Procedure: CARDIOVERSION;  Surgeon: Satira Sark, MD;  [facility];  Service: Cardiovascular;  Laterality: N/A;  . CATARACT EXTRACTION W/PHACO Right 05/18/2013   Procedure: CATARACT EXTRACTION PHACO AND INTRAOCULAR  LENS PLACEMENT RIGHT EYE;  Surgeon: Tonny Branch, MD;  Location: [facility];  Service: Ophthalmology;  Laterality: Right;  CDE:13.35  . CATARACT EXTRACTION W/PHACO Left 02/10/2016   Procedure: CATARACT EXTRACTION PHACO AND INTRAOCULAR LENS PLACEMENT LEFT EYE: CDE:  5.39;  Surgeon: Tonny Branch, MD;  Location: [facility];  Service: Ophthalmology;  Laterality: Left;  . CHOLECYSTECTOMY    . ELECTROPHYSIOLOGIC STUDY N/A 11/19/2015   Procedure: Atrial Fibrillation Ablation;  Surgeon: Thompson Grayer, MD;  Location: [place] CV LAB;  Service: Cardiovascular;  Laterality: N/A;  . GAS/FLUID EXCHANGE Right 09/29/2012   Procedure: GAS/FLUID EXCHANGE;  Surgeon: Hayden Pedro, MD;  Location: [place];  Service: Ophthalmology;  Laterality: Right;  . LASER PHOTO ABLATION Right 09/29/2012   Procedure: LASER PHOTO ABLATION;  Surgeon: Hayden Pedro, MD;  Location: [place];  Service: Ophthalmology;  Laterality: Right;  Headscope laser  . MASTECTOMY Left    1997; S/P chemotherapy/radiation  . MEMBRANE PEEL Right 09/29/2012   Procedure: MEMBRANE PEEL;  Surgeon: Hayden Pedro, MD;  Location: [place];  Service: Ophthalmology;  Laterality: Right;  . TEE WITHOUT CARDIOVERSION N/A 11/19/2015   Procedure: TRANSESOPHAGEAL ECHOCARDIOGRAM (TEE);  Surgeon: Thayer Headings, MD;  Location: [place];  Service: Cardiovascular;  Laterality: N/A;  . TUBAL LIGATION       Current Outpatient Prescriptions  Medication Sig Dispense Refill  . acetaminophen (TYLENOL) 500 MG tablet Take 500-1,000 mg by mouth every 6 (six) hours as needed for moderate pain or headache.    Marland Kitchen  furosemide (LASIX) 40 MG tablet Take 1 tablet (40 mg total) by mouth daily as needed for fluid (or weight gain). 30 tablet 2  . GARLIC OIL PO Take 1 capsule by mouth daily.    Marland Kitchen levothyroxine (SYNTHROID, LEVOTHROID) 112 MCG tablet Take 112 mcg by mouth daily.     Marland Kitchen losartan-hydrochlorothiazide (HYZAAR) 50-12.5 MG tablet Take 1 tablet by mouth daily.    . metoprolol succinate  (TOPROL-XL) 25 MG 24 hr tablet Take 1 tablet (25 mg total) by mouth daily. 90 tablet 1  . Multiple Vitamins-Minerals (OCUVITE PRESERVISION PO) Take 1 tablet by mouth 2 (two) times daily.     Marland Kitchen NITROSTAT 0.4 MG SL tablet PLACE 1 TABLET UNDER THE TONGUE EVERY 5 MINUTES FOR 3 DOSES AS NEEDED CHEST PAIN 25 tablet 3  . Omega-3 Fatty Acids (FISH OIL) 1000 MG CAPS Take 1 capsule by mouth daily.    . Vitamin D, Cholecalciferol, 1000 units CAPS Take 1,000 Units by mouth daily.     Alveda Reasons 20 MG TABS tablet TAKE 1 TABLET BY MOUTH DAILY WITH SUPPER. 30 tablet 6   No current facility-administered medications for this visit.     Allergies:   Doxycycline   Social History:  The patient  reports that she has never smoked. She has never used smokeless tobacco. She reports that she does not drink alcohol or use drugs.   Family History:  The patient's  family history includes Heart attack (age of onset: 34) in her father.    ROS:  Please see the history of present illness.   All other systems are reviewed and negative.    PHYSICAL EXAM: VS:  BP 123/73   Pulse 70   Ht 5\' 3"  (1.6 m)   Wt 197 lb 6.4 oz (89.5 kg)   LMP 09/28/2012   SpO2 99%   BMI 34.97 kg/m  , BMI Body mass index is 34.97 kg/m. GEN: Well nourished, well developed, in no acute distress  HEENT: normal  Neck: no JVD, carotid bruits, or masses Cardiac:RRR Respiratory:  clear to auscultation bilaterally, normal work of breathing GI: soft, nontender, nondistended, + BS MS: no deformity or atrophy  Skin: warm and dry  Neuro:  Strength and sensation are intact Psych: euthymic mood, full affect  Recent Labs: 10/29/2015: ALT 22; B Natriuretic Peptide 1,555.0; TSH 5.133 10/30/2015: Magnesium 1.7 01/31/2016: BUN 10; Creatinine, Ser 0.72; Hemoglobin 12.1; Platelets 265; Potassium 3.6; Sodium 138    Lipid Panel  No results found for: CHOL, TRIG, HDL, CHOLHDL, VLDL, LDLCALC, LDLDIRECT   Wt Readings from Last 3 Encounters:  05/22/16 197  lb 6.4 oz (89.5 kg)  04/22/16 196 lb (88.9 kg)  02/14/16 190 lb 3.2 oz (86.3 kg)     Other studies Reviewed: Additional studies/ records that were reviewed today include: Dr McDowell's notes    ASSESSMENT AND PLAN:  1.  Persistent atrial fibrillation Doing very well s/p ablation EF has recovered Maintaining sinus rhythm off of AAD therapy Continue xarelto for chads2vasc score of at least 5  2. Nonischemic CM EF has recovered No changes today  3. Mild OSA Study reviewed No therapy advised  4. Hypertensive cardiovascular disease improved  5. Overweight Unfortunately, she has gained the weight back that she lost. Lifestyle modification again encouraged today  Follow-up with Dr Domenic Polite as scheduled I will see again in 12 months unless problems arise  Signed, Thompson Grayer, MD  05/22/2016 10:17 AM     [facility] [address]  [address] [place] [address] [phone] (office) [phone] (fax)

## 2016-07-27 DIAGNOSIS — I209 Angina pectoris, unspecified: Secondary | ICD-10-CM | POA: Diagnosis not present

## 2016-07-27 DIAGNOSIS — E7800 Pure hypercholesterolemia, unspecified: Secondary | ICD-10-CM | POA: Diagnosis not present

## 2016-07-27 DIAGNOSIS — I10 Essential (primary) hypertension: Secondary | ICD-10-CM | POA: Diagnosis not present

## 2016-07-27 DIAGNOSIS — I4891 Unspecified atrial fibrillation: Secondary | ICD-10-CM | POA: Diagnosis not present

## 2016-07-27 DIAGNOSIS — E039 Hypothyroidism, unspecified: Secondary | ICD-10-CM | POA: Diagnosis not present

## 2016-08-02 ENCOUNTER — Other Ambulatory Visit: Payer: Self-pay | Admitting: Cardiovascular Disease

## 2016-09-15 DIAGNOSIS — L03115 Cellulitis of right lower limb: Secondary | ICD-10-CM | POA: Diagnosis not present

## 2016-10-19 ENCOUNTER — Telehealth: Payer: Self-pay | Admitting: Cardiology

## 2016-10-19 NOTE — Telephone Encounter (Signed)
Patient would like samples of Xarelto 

## 2016-10-20 MED ORDER — RIVAROXABAN 20 MG PO TABS: 20.0000 mg | ORAL_TABLET | Freq: Every day | ORAL | 0 refills | Status: DC

## 2016-10-20 NOTE — Telephone Encounter (Signed)
Returned call - left detailed message on voice mail.  Xarelto samples ready for pick up.

## 2016-10-29 DIAGNOSIS — M545 Low back pain: Secondary | ICD-10-CM | POA: Diagnosis not present

## 2016-11-04 DIAGNOSIS — M545 Low back pain: Secondary | ICD-10-CM | POA: Diagnosis not present

## 2016-11-11 DIAGNOSIS — M545 Low back pain: Secondary | ICD-10-CM | POA: Diagnosis not present

## 2016-11-18 DIAGNOSIS — M545 Low back pain: Secondary | ICD-10-CM | POA: Diagnosis not present

## 2016-11-26 ENCOUNTER — Other Ambulatory Visit: Payer: Self-pay | Admitting: Cardiovascular Disease

## 2016-12-08 ENCOUNTER — Telehealth: Payer: Self-pay | Admitting: Cardiology

## 2016-12-08 MED ORDER — RIVAROXABAN 20 MG PO TABS: 20.0000 mg | ORAL_TABLET | Freq: Every day | ORAL | 0 refills | Status: DC

## 2016-12-08 NOTE — Telephone Encounter (Signed)
Patient called requesting samples of XARELTO 20 MG TABS tablet   States that she is in the donut hole and it will cost her over $600.00 to fill this prescription. Please call [phone] and leave a message.

## 2016-12-08 NOTE — Addendum Note (Signed)
Addended by: Laurine Blazer on: 12/08/2016 05:25 PM   Modules accepted: Orders

## 2016-12-08 NOTE — Telephone Encounter (Signed)
Patient notified samples are ready for pick up.  [employer] assistance phone number given today also.

## 2016-12-10 DIAGNOSIS — E039 Hypothyroidism, unspecified: Secondary | ICD-10-CM | POA: Diagnosis not present

## 2016-12-10 DIAGNOSIS — I4891 Unspecified atrial fibrillation: Secondary | ICD-10-CM | POA: Diagnosis not present

## 2016-12-10 DIAGNOSIS — D519 Vitamin B12 deficiency anemia, unspecified: Secondary | ICD-10-CM | POA: Diagnosis not present

## 2016-12-10 DIAGNOSIS — I10 Essential (primary) hypertension: Secondary | ICD-10-CM | POA: Diagnosis not present

## 2016-12-10 DIAGNOSIS — Z0000 Encounter for general adult medical examination without abnormal findings: Secondary | ICD-10-CM | POA: Diagnosis not present

## 2017-01-28 NOTE — Progress Notes (Signed)
Cardiology Office Note  Date: 01/29/2017   ID: [name], [name] 1945/09/01, MRN [id number]  PCP: [name], MD  Primary Cardiologist: Rozann Lesches, MD   Chief Complaint  Patient presents with  . PAF    History of Present Illness: [name] Fry is a 71 y.o. female last seen in February.  She presents with her daughter today for a follow-up visit.  He states that she had an episode of palpitations on November 1, otherwise has been relatively stable.  She describes a feeling of relative anxiety.  Still does her ADLs.  She does not report angina symptoms.  She had a visit with Dr. [name] in March with history of prior atrial fibrillation ablation.  Her rhythm control has been better.  We discussed follow-up lab work on [place].  Will have this done through [facility].  She does not report any bleeding problems.  She also remains on Toprol-XL.  I personally reviewed her ECG today which shows sinus rhythm with left anterior fascicular block and IVCD.  Past Medical History:  Diagnosis Date  . Arthritis   . Atrial fibrillation [facility])    Documented January 2017  . Breast cancer ([place])    Left  . Cataracts, bilateral   . Coronary atherosclerosis of native coronary artery    DES OM 3/12  . Essential hypertension, benign   . GERD (gastroesophageal reflux disease)   . H/O hiatal hernia   . Hyperlipidemia   . Hypothyroidism   . Sleep apnea     Past Surgical History:  Procedure Laterality Date  . [place] VITRECTOMY WITH 20 GAUGE MVR PORT Right 09/29/2012   Performed by Hayden Pedro, MD at [place]    . Atrial Fibrillation Ablation N/A 11/19/2015   Performed by Thompson Grayer, MD at [place] [facility]  . CARDIOVERSION N/A 08/30/2015   Performed by Satira Sark, MD at [place]  . CATARACT EXTRACTION PHACO AND INTRAOCULAR LENS PLACEMENT LEFT EYE: CDE:  5.39 Left 02/10/2016   Performed by Tonny Branch, MD at [facility]  . CATARACT EXTRACTION PHACO  AND INTRAOCULAR LENS PLACEMENT RIGHT EYE Right 05/18/2013   Performed by Tonny Branch, MD at [facility]  . CHOLECYSTECTOMY    . GAS/FLUID EXCHANGE Right 09/29/2012   Performed by Hayden Pedro, MD at [place]  . LASER PHOTO ABLATION Right 09/29/2012   Performed by Hayden Pedro, MD at [facility] OR  . Left Heart Cath and Coronary Angiography N/A 07/26/2015   Performed by [name], MD at [place] CV LAB  . MASTECTOMY Left    1997; S/P chemotherapy/radiation  . MEMBRANE PEEL Right 09/29/2012   Performed by Hayden Pedro, MD at [facility] OR  . TRANSESOPHAGEAL ECHOCARDIOGRAM (TEE) N/A 11/19/2015   Performed by Acie Fredrickson Wonda Cheng, MD at [place]  . TUBAL LIGATION      Current Outpatient Medications  Medication Sig Dispense Refill  . acetaminophen (TYLENOL) 500 MG tablet Take 500-1,000 mg by mouth every 6 (six) hours as needed for moderate pain or headache.    . furosemide (LASIX) 40 MG tablet Take 1 tablet (40 mg total) by mouth daily as needed for fluid (or weight gain). 30 tablet 2  . GARLIC OIL PO Take 1 capsule by mouth daily.    Marland Kitchen levothyroxine (SYNTHROID, LEVOTHROID) 112 MCG tablet Take 112 mcg by mouth daily.     Marland Kitchen losartan-hydrochlorothiazide (HYZAAR) 50-12.5 MG tablet Take  1 tablet by mouth daily.    . metoprolol succinate (TOPROL-XL) 25 MG 24 hr tablet Take 1 tablet (25 mg total) by mouth daily. 90 tablet 3  . Multiple Vitamins-Minerals (OCUVITE PRESERVISION PO) Take 1 tablet by mouth 2 (two) times daily.     Marland Kitchen NITROSTAT 0.4 MG SL tablet PLACE 1 TABLET UNDER THE TONGUE EVERY 5 MINUTES FOR 3 DOSES AS NEEDED CHEST PAIN 25 tablet 3  . Omega-3 Fatty Acids (FISH OIL) 1000 MG CAPS Take 1 capsule by mouth daily.    . rivaroxaban (XARELTO) 20 MG TABS tablet Take 1 tablet (20 mg total) daily with supper by mouth. 28 tablet 0  . Vitamin D, Cholecalciferol, 1000 units CAPS Take 1,000 Units by mouth daily.      No current facility-administered medications for this visit.    Allergies:  Doxycycline     Social History: The patient  reports that  has never smoked. she has never used smokeless tobacco. She reports that she does not drink alcohol or use drugs.   ROS:  Please see the history of present illness. Otherwise, complete review of systems is positive for anxiety.  All other systems are reviewed and negative.   Physical Exam: VS:  BP 118/72   Pulse 73   Ht 5\' 3"  (1.6 m)   Wt 206 lb 12.8 oz (93.8 kg)   LMP 09/28/2012   SpO2 98%   BMI 36.63 kg/m , BMI Body mass index is 36.63 kg/m.  Wt Readings from Last 3 Encounters:  01/29/17 206 lb 12.8 oz (93.8 kg)  05/22/16 197 lb 6.4 oz (89.5 kg)  04/22/16 196 lb (88.9 kg)    General: Overweight woman, appears comfortable at rest. HEENT: Conjunctiva and lids normal, oropharynx clear. Neck: Supple, no elevated JVP or carotid bruits, no thyromegaly. Lungs: Clear to auscultation, nonlabored breathing at rest. Cardiac: Regular rate and rhythm, no S3 or significant systolic murmur, no pericardial rub. Abdomen: Soft, nontender, bowel sounds present, no guarding or rebound. Extremities: Trace ankle edema, distal pulses 2+. Skin: Warm and dry. Musculoskeletal: No kyphosis. Neuropsychiatric: Alert and oriented x3, affect grossly appropriate.  ECG: I personally reviewed the tracing from 04/22/2016 which showed sinus rhythm with left anterior fascicular block and IVCD.  Recent Labwork: 01/31/2016: BUN 10; Creatinine, Ser 0.72; Hemoglobin 12.1; Platelets 265; Potassium 3.6; Sodium 138  Other Studies Reviewed Today:  Echocardiogram 02/04/2016: Study Conclusions  - Left ventricle: The cavity size was normal. Wall thickness was   increased in a pattern of mild LVH. Systolic function was normal.   The estimated ejection fraction was in the range of 55% to 60%.   Diastolic function is abnormal, indeterminate grade. There is   evidence of elevated LA pressure. Wall motion was normal; there   were no regional wall motion abnormalities. -  Aortic valve: Mildly calcified annulus. Trileaflet; normal   thickness leaflets. Valve area (VTI): 1.88 cm^2. Valve area   (Vmax): 1.78 cm^2. Valve area (Vmean): 1.7 cm^2. - Mitral valve: Mildly calcified annulus. Mildly thickened leaflets   . There was mild regurgitation. - Technically adequate study.  Assessment and Plan:  1.  Paroxysmal atrial fibrillation status post ablation with Dr. [name].  Rhythm control is better, she has occasional palpitations but no prolonged episodes of atrial fibrillation documented.  She continues on Toprol-XL and Xarelto.  Follow-up CBC and BMET.  ECG today shows sinus rhythm.  2.  History of nonischemic cardiomyopathy with normalization of LVEF, most recently 55-60%.  3.  CAD  status post DES to the obtuse marginal in 2012.  Stent site was patent and angiography in 2017.  4.  Essential hypertension, blood pressure is well controlled today.  Keep follow-up with Dr. Nadara Mustard.  Current medicines were reviewed with the patient today.   Orders Placed This Encounter  Procedures  . CBC  . Basic Metabolic Panel (BMET)  . EKG 12-Lead    Disposition: Follow-up in 6 months.  Signed, Satira Sark, MD, [facility] 01/29/2017 4:08 PM    [facility] at [place], [place], [place] [address] Phone: [phone]; Fax: [phone]

## 2017-01-29 ENCOUNTER — Ambulatory Visit: Payer: Medicare Other | Admitting: Cardiology

## 2017-01-29 ENCOUNTER — Encounter: Payer: Self-pay | Admitting: Cardiology

## 2017-01-29 VITALS — BP 118/72 | HR 73 | Ht 63.0 in | Wt 206.8 lb

## 2017-01-29 DIAGNOSIS — I10 Essential (primary) hypertension: Secondary | ICD-10-CM

## 2017-01-29 DIAGNOSIS — I480 Paroxysmal atrial fibrillation: Secondary | ICD-10-CM | POA: Diagnosis not present

## 2017-01-29 DIAGNOSIS — I428 Other cardiomyopathies: Secondary | ICD-10-CM

## 2017-01-29 DIAGNOSIS — I2510 Atherosclerotic heart disease of native coronary artery without angina pectoris: Secondary | ICD-10-CM

## 2017-01-29 MED ORDER — RIVAROXABAN 20 MG PO TABS: 20.0000 mg | ORAL_TABLET | Freq: Every day | ORAL | 0 refills | Status: DC

## 2017-01-29 NOTE — Patient Instructions (Signed)
Your physician wants you to follow-up in: [place] will receive a reminder letter in the mail two months in advance. If you don't receive a letter, please call our office to schedule the follow-up appointment.  Your physician recommends that you continue on your current medications as directed. Please refer to the Current Medication list given to you today.  Your physician recommends that you return for lab work CBC/BMP  Thank you for choosing [facility]!!

## 2017-02-08 DIAGNOSIS — I480 Paroxysmal atrial fibrillation: Secondary | ICD-10-CM | POA: Diagnosis not present

## 2017-02-09 ENCOUNTER — Telehealth: Payer: Self-pay

## 2017-02-09 NOTE — Telephone Encounter (Signed)
LMTCB

## 2017-02-09 NOTE — Telephone Encounter (Signed)
-----   Message from Acquanetta Chain, LPN sent at 65/05/5463  2:41 PM EST -----   ----- Message ----- From: Satira Sark, MD Sent: 02/09/2017   2:19 PM To: [name], LPN  Results reviewed.  Hemoglobin and renal function are stable.  Continue with current regimen. A copy of this test should be forwarded to [name], MD.

## 2017-02-10 NOTE — Telephone Encounter (Signed)
Patient notified. Routed to PCP 

## 2017-02-10 NOTE — Telephone Encounter (Signed)
-----   Message from Acquanetta Chain, LPN sent at 47/11/2955  2:41 PM EST -----   ----- Message ----- From: Satira Sark, MD Sent: 02/09/2017   2:19 PM To: [name], LPN  Results reviewed.  Hemoglobin and renal function are stable.  Continue with current regimen. A copy of this test should be forwarded to [name], MD.

## 2017-03-24 DIAGNOSIS — D519 Vitamin B12 deficiency anemia, unspecified: Secondary | ICD-10-CM | POA: Diagnosis not present

## 2017-03-24 DIAGNOSIS — D510 Vitamin B12 deficiency anemia due to intrinsic factor deficiency: Secondary | ICD-10-CM | POA: Diagnosis not present

## 2017-04-05 ENCOUNTER — Other Ambulatory Visit: Payer: Self-pay | Admitting: Cardiology

## 2017-05-01 DIAGNOSIS — J069 Acute upper respiratory infection, unspecified: Secondary | ICD-10-CM | POA: Diagnosis not present

## 2017-05-04 ENCOUNTER — Other Ambulatory Visit: Payer: Self-pay | Admitting: Internal Medicine

## 2017-05-14 ENCOUNTER — Ambulatory Visit: Payer: Self-pay | Admitting: Internal Medicine

## 2017-06-04 ENCOUNTER — Encounter: Payer: Self-pay | Admitting: Internal Medicine

## 2017-06-04 ENCOUNTER — Ambulatory Visit: Payer: Medicare Other | Admitting: Internal Medicine

## 2017-06-04 ENCOUNTER — Other Ambulatory Visit: Payer: Self-pay

## 2017-06-04 VITALS — BP 136/83 | HR 65 | Ht 63.0 in | Wt 208.0 lb

## 2017-06-04 DIAGNOSIS — I480 Paroxysmal atrial fibrillation: Secondary | ICD-10-CM

## 2017-06-04 NOTE — Progress Notes (Signed)
PCP: [name], MD Primary Cardiologist: Dr Domenic Polite Primary EP: Dr [name]  [name] Fry is a 72 y.o. female who presents today for routine electrophysiology followup.  Since last being seen in our clinic, the patient reports doing very well.  Today, she denies symptoms of palpitations, chest pain, shortness of breath,  lower extremity edema, dizziness, presyncope, or syncope.  The patient is otherwise without complaint today.   Past Medical History:  Diagnosis Date  . Arthritis   . Atrial fibrillation [facility])    Documented January 2017  . Breast cancer ([place])    Left  . Cataracts, bilateral   . Coronary atherosclerosis of native coronary artery    DES OM 3/12  . Essential hypertension, benign   . GERD (gastroesophageal reflux disease)   . H/O hiatal hernia   . Hyperlipidemia   . Hypothyroidism   . Sleep apnea    Past Surgical History:  Procedure Laterality Date  . [place] VITRECTOMY WITH 20 GAUGE MVR PORT Right 09/29/2012   Procedure: 25 GAUGE PARS PLANA VITRECTOMY WITH 20 GAUGE MVR PORT;  Surgeon: Hayden Pedro, MD;  Location: [place];  Service: Ophthalmology;  Laterality: Right;  . APPENDECTOMY    . CARDIAC CATHETERIZATION N/A 07/26/2015   Procedure: Left Heart Cath and Coronary Angiography;  Surgeon: [name], MD;  Location: [place] [facility];  Service: Cardiovascular;  Laterality: N/A;  . CARDIOVERSION N/A 08/30/2015   Procedure: CARDIOVERSION;  Surgeon: Satira Sark, MD;  [facility];  Service: Cardiovascular;  Laterality: N/A;  . CATARACT EXTRACTION W/PHACO Right 05/18/2013   Procedure: CATARACT EXTRACTION PHACO AND INTRAOCULAR LENS PLACEMENT RIGHT EYE;  Surgeon: Tonny Branch, MD;  [facility]: [facility];  Service: Ophthalmology;  Laterality: Right;  CDE:13.35  . CATARACT EXTRACTION W/PHACO Left 02/10/2016   Procedure: CATARACT EXTRACTION PHACO AND INTRAOCULAR LENS PLACEMENT LEFT EYE: CDE:  5.39;  Surgeon: Tonny Branch, MD;  [facility]: [facility];   Service: Ophthalmology;  Laterality: Left;  . CHOLECYSTECTOMY    . ELECTROPHYSIOLOGIC STUDY N/A 11/19/2015   Procedure: Atrial Fibrillation Ablation;  Surgeon: Thompson Grayer, MD;  Location: [place] [facility];  Service: Cardiovascular;  Laterality: N/A;  . GAS/FLUID EXCHANGE Right 09/29/2012   Procedure: GAS/FLUID EXCHANGE;  Surgeon: Hayden Pedro, MD;  Location: [place];  Service: Ophthalmology;  Laterality: Right;  . LASER PHOTO ABLATION Right 09/29/2012   Procedure: LASER PHOTO ABLATION;  Surgeon: Hayden Pedro, MD;  Location: [place];  Service: Ophthalmology;  Laterality: Right;  Headscope laser  . MASTECTOMY Left    1997; S/P chemotherapy/radiation  . MEMBRANE PEEL Right 09/29/2012   Procedure: MEMBRANE PEEL;  Surgeon: Hayden Pedro, MD;  Location: [place];  Service: Ophthalmology;  Laterality: Right;  . TEE WITHOUT CARDIOVERSION N/A 11/19/2015   Procedure: TRANSESOPHAGEAL ECHOCARDIOGRAM (TEE);  Surgeon: Thayer Headings, MD;  Location: [place];  Service: Cardiovascular;  Laterality: N/A;  . TUBAL LIGATION      ROS- all systems are reviewed and negatives except as per HPI above  Current Outpatient Medications  Medication Sig Dispense Refill  . acetaminophen (TYLENOL) 500 MG tablet Take 500-1,000 mg by mouth every 6 (six) hours as needed for moderate pain or headache.    . furosemide (LASIX) 40 MG tablet Take 1 tablet (40 mg total) by mouth daily as needed for fluid (or weight gain). 30 tablet 2  . GARLIC OIL PO Take 1 capsule by mouth daily.    Marland Kitchen levothyroxine (SYNTHROID,  LEVOTHROID) 112 MCG tablet Take 112 mcg by mouth daily.     Marland Kitchen losartan-hydrochlorothiazide (HYZAAR) 50-12.5 MG tablet Take 1 tablet by mouth daily.    . metoprolol succinate (TOPROL-XL) 25 MG 24 hr tablet TAKE 1 TABLET BY MOUTH DAILY 90 tablet 0  . Multiple Vitamins-Minerals (OCUVITE PRESERVISION PO) Take 1 tablet by mouth 2 (two) times daily.     Marland Kitchen NITROSTAT 0.4 MG SL tablet PLACE 1 TABLET UNDER THE TONGUE EVERY 5  MINUTES FOR 3 DOSES AS NEEDED CHEST PAIN 25 tablet 3  . Omega-3 Fatty Acids (FISH OIL) 1000 MG CAPS Take 1 capsule by mouth daily.    . rivaroxaban (XARELTO) 20 MG TABS tablet Take 1 tablet (20 mg total) daily with supper by mouth. 28 tablet 0  . Vitamin D, Cholecalciferol, 1000 units CAPS Take 1,000 Units by mouth daily.     Alveda Reasons 20 MG TABS tablet TAKE ONE TABLET BY MOUTH EVERY DAY with SUPPER (pt. needs APPOINTMENT. FOR further refills) 90 tablet 3   No current facility-administered medications for this visit.     Physical Exam: Vitals:   06/04/17 0946  BP: 136/83  Pulse: 65  SpO2: 98%  Weight: 208 lb (94.3 kg)  Height: 5\' 3"  (1.6 m)    GEN- The patient is well appearing, alert and oriented x 3 today.   Head- normocephalic, atraumatic Eyes-  Sclera clear, conjunctiva pink Ears- hearing intact Oropharynx- clear Lungs- Clear to ausculation bilaterally, normal work of breathing Heart- Regular rate and rhythm, no murmurs, rubs or gallops, PMI not laterally displaced GI- soft, NT, ND, + BS Extremities- no clubbing, cyanosis, or edema  EKG tracing ordered today is personally reviewed and shows sinus rhythm with LBBB  Assessment and Plan:  1. Persistent atrial fibrillation Doing well s/p ablation off AAD therapy On xarelto for chads2vasc score of 5  2. Nonischemic CM EF recovered with sinus  3. HTN Stable No change required today  4. Overweight Body mass index is 36.85 kg/m. Lifestyle modification encouraged  5. CAD No ischemic symptoms  Follow-up with Dr Domenic Polite as scheduled I will see in a year  Thompson Grayer MD, [facility] 06/04/2017 10:11 AM

## 2017-06-04 NOTE — Patient Instructions (Signed)
Your physician wants you to follow-up in: 1 YEAR WITH DR ALLRED You will receive a reminder letter in the mail two months in advance. If you don't receive a letter, please call our office to schedule the follow-up appointment.  Your physician recommends that you continue on your current medications as directed. Please refer to the Current Medication list given to you today.  Thank you for choosing [facility]!!   

## 2017-06-10 NOTE — Addendum Note (Signed)
Addended by: Laurine Blazer on: 06/10/2017 10:04 AM   Modules accepted: Orders

## 2017-07-29 ENCOUNTER — Other Ambulatory Visit: Payer: Self-pay | Admitting: Internal Medicine

## 2017-08-02 DIAGNOSIS — E039 Hypothyroidism, unspecified: Secondary | ICD-10-CM | POA: Diagnosis not present

## 2017-08-03 NOTE — Progress Notes (Signed)
Cardiology Office Note  Date: 08/04/2017   ID: [name] Fry, [name] Fry Jun 18, 1945, MRN [id number]  PCP: [name], MD  Primary Cardiologist: Rozann Lesches, MD   Chief Complaint  Patient presents with  . PAF    History of Present Illness: [name] Fry is a 72 y.o. female last seen in November 2018.  She presents for a follow-up visit.  States that she has been doing well, no significant palpitations or angina symptoms.  She has been working in her flowers this spring.  She follows with Dr. [name] with history of previous atrial fibrillation ablation.   Current cardiac regimen includes Toprol-XL and Xarelto.  She does not report any bleeding problems.  We did discuss obtaining follow-up lab work.  Past Medical History:  Diagnosis Date  . Arthritis   . Atrial fibrillation [facility])    Documented January 2017  . Breast cancer ([place])    Left  . Cataracts, bilateral   . Coronary atherosclerosis of native coronary artery    DES OM 3/12  . Essential hypertension, benign   . GERD (gastroesophageal reflux disease)   . H/O hiatal hernia   . Hyperlipidemia   . Hypothyroidism   . Sleep apnea     Past Surgical History:  Procedure Laterality Date  . [place] VITRECTOMY WITH 20 GAUGE MVR PORT Right 09/29/2012   Procedure: 25 GAUGE PARS PLANA VITRECTOMY WITH 20 GAUGE MVR PORT;  Surgeon: Hayden Pedro, MD;  Location: [place];  Service: Ophthalmology;  Laterality: Right;  . APPENDECTOMY    . CARDIAC CATHETERIZATION N/A 07/26/2015   Procedure: Left Heart Cath and Coronary Angiography;  Surgeon: [name], MD;  Location: [place] [facility];  Service: Cardiovascular;  Laterality: N/A;  . CARDIOVERSION N/A 08/30/2015   Procedure: CARDIOVERSION;  Surgeon: Satira Sark, MD;  [facility];  Service: Cardiovascular;  Laterality: N/A;  . CATARACT EXTRACTION W/PHACO Right 05/18/2013   Procedure: CATARACT EXTRACTION PHACO AND INTRAOCULAR LENS PLACEMENT RIGHT EYE;  Surgeon:  Tonny Branch, MD;  [facility];  Service: Ophthalmology;  Laterality: Right;  CDE:13.35  . CATARACT EXTRACTION W/PHACO Left 02/10/2016   Procedure: CATARACT EXTRACTION PHACO AND INTRAOCULAR LENS PLACEMENT LEFT EYE: CDE:  5.39;  Surgeon: Tonny Branch, MD;  [facility];  Service: Ophthalmology;  Laterality: Left;  . CHOLECYSTECTOMY    . ELECTROPHYSIOLOGIC STUDY N/A 11/19/2015   Procedure: Atrial Fibrillation Ablation;  Surgeon: Thompson Grayer, MD;  Location: [place] [facility];  Service: Cardiovascular;  Laterality: N/A;  . GAS/FLUID EXCHANGE Right 09/29/2012   Procedure: GAS/FLUID EXCHANGE;  Surgeon: Hayden Pedro, MD;  Location: [place];  Service: Ophthalmology;  Laterality: Right;  . LASER PHOTO ABLATION Right 09/29/2012   Procedure: LASER PHOTO ABLATION;  Surgeon: Hayden Pedro, MD;  Location: [place];  Service: Ophthalmology;  Laterality: Right;  Headscope laser  . MASTECTOMY Left    1997; S/P chemotherapy/radiation  . MEMBRANE PEEL Right 09/29/2012   Procedure: MEMBRANE PEEL;  Surgeon: Hayden Pedro, MD;  Location: [place];  Service: Ophthalmology;  Laterality: Right;  . TEE WITHOUT CARDIOVERSION N/A 11/19/2015   Procedure: TRANSESOPHAGEAL ECHOCARDIOGRAM (TEE);  Surgeon: Thayer Headings, MD;  Location: [place];  Service: Cardiovascular;  Laterality: N/A;  . TUBAL LIGATION      Current Outpatient Medications  Medication Sig Dispense Refill  . acetaminophen (TYLENOL) 500 MG tablet Take 500-1,000 mg by mouth every 6 (six) hours as needed for moderate pain or headache.    Marland Kitchen  Cyanocobalamin (VITAMIN B-12 PO) Take by mouth. Occasionally    . furosemide (LASIX) 40 MG tablet Take 1 tablet (40 mg total) by mouth daily as needed for fluid (or weight gain). 30 tablet 2  . GARLIC OIL PO Take 1 capsule by mouth. Occasionally    . levothyroxine (SYNTHROID, LEVOTHROID) 112 MCG tablet Take 112 mcg by mouth daily.     Marland Kitchen losartan-hydrochlorothiazide (HYZAAR) 50-12.5 MG tablet Take 1 tablet by mouth  daily.    . metoprolol succinate (TOPROL-XL) 25 MG 24 hr tablet TAKE ONE TABLET BY MOUTH EVERY DAY 90 tablet 3  . Multiple Vitamins-Minerals (OCUVITE PRESERVISION PO) Take 1 tablet by mouth 2 (two) times daily.     Marland Kitchen NITROSTAT 0.4 MG SL tablet PLACE 1 TABLET UNDER THE TONGUE EVERY 5 MINUTES FOR 3 DOSES AS NEEDED CHEST PAIN 25 tablet 3  . Omega-3 Fatty Acids (FISH OIL) 1000 MG CAPS Take 1 capsule by mouth. Occasionally    . rivaroxaban (XARELTO) 20 MG TABS tablet Take 1 tablet (20 mg total) daily with supper by mouth. 28 tablet 0  . XARELTO 20 MG TABS tablet TAKE ONE TABLET BY MOUTH EVERY DAY with SUPPER (pt. needs APPOINTMENT. FOR further refills) 90 tablet 3   No current facility-administered medications for this visit.    Allergies:  Doxycycline   Social History: The patient  reports that she has never smoked. She has never used smokeless tobacco. She reports that she does not drink alcohol or use drugs.   ROS:  Please see the history of present illness. Otherwise, complete review of systems is positive for arthritic pains.  All other systems are reviewed and negative.   Physical Exam: VS:  BP 132/81   Pulse 72   Ht 5\' 3"  (1.6 m)   Wt 209 lb 12.8 oz (95.2 kg)   LMP 09/28/2012   SpO2 98%   BMI 37.16 kg/m , BMI Body mass index is 37.16 kg/m.  Wt Readings from Last 3 Encounters:  08/04/17 209 lb 12.8 oz (95.2 kg)  06/04/17 208 lb (94.3 kg)  01/29/17 206 lb 12.8 oz (93.8 kg)    General: Patient appears comfortable at rest. HEENT: Conjunctiva and lids normal, oropharynx clear. Neck: Supple, no elevated JVP or carotid bruits, no thyromegaly. Lungs: Clear to auscultation, nonlabored breathing at rest. Cardiac: Regular rate and rhythm, no S3 or significant systolic murmur, no pericardial rub. Abdomen: Soft, nontender, bowel sounds present. Extremities: Trace ankle edema, distal pulses 2+. Skin: Warm and dry. Musculoskeletal: No kyphosis. Neuropsychiatric: Alert and oriented x3,  affect grossly appropriate.  ECG: I personally reviewed the tracing from 06/04/2017 which showed sinus rhythm with left bundle branch block.  Recent Labwork:  November 2018: BUN 11, creatinine 0.76, potassium 3.9, hemoglobin 12.4, platelets 258  Other Studies Reviewed Today:  Echocardiogram 02/04/2016: Study Conclusions  - Left ventricle: The cavity size was normal. Wall thickness was   increased in a pattern of mild LVH. Systolic function was normal.   The estimated ejection fraction was in the range of 55% to 60%.   Diastolic function is abnormal, indeterminate grade. There is   evidence of elevated LA pressure. Wall motion was normal; there   were no regional wall motion abnormalities. - Aortic valve: Mildly calcified annulus. Trileaflet; normal   thickness leaflets. Valve area (VTI): 1.88 cm^2. Valve area   (Vmax): 1.78 cm^2. Valve area (Vmean): 1.7 cm^2. - Mitral valve: Mildly calcified annulus. Mildly thickened leaflets.     There was mild regurgitation. -  Technically adequate study.  Assessment and Plan:  1.  Paroxysmal atrial fibrillation, doing well status post atrial fibrillation ablation with Dr. [name].  She reports no palpitations and continues on Toprol-XL and Xarelto.  Follow-up CBC and BMET.  2.  Nonischemic cardiomyopathy with normalization of LVEF, last assessed at 55 to 60%.  3.  CAD status post DES to the obtuse marginal in 2012.  Stent site patent in 2017.  She reports no active angina.  Not on aspirin with use of Xarelto and has statin intolerance.  4.  Essential hypertension, blood pressure is well controlled today.  Keep follow-up with Dr. Nadara Mustard.  Current medicines were reviewed with the patient today.   Orders Placed This Encounter  Procedures  . Basic metabolic panel  . CBC    Disposition: Follow-up in 6 months.  Signed, Satira Sark, MD, [facility] 08/04/2017 1:20 PM    [facility] at [place],  [place], [place] [address] Phone: [phone]; Fax: [phone]

## 2017-08-04 ENCOUNTER — Encounter: Payer: Self-pay | Admitting: Cardiology

## 2017-08-04 ENCOUNTER — Ambulatory Visit: Payer: Medicare Other | Admitting: Cardiology

## 2017-08-04 VITALS — BP 132/81 | HR 72 | Ht 63.0 in | Wt 209.8 lb

## 2017-08-04 DIAGNOSIS — I428 Other cardiomyopathies: Secondary | ICD-10-CM | POA: Diagnosis not present

## 2017-08-04 DIAGNOSIS — I10 Essential (primary) hypertension: Secondary | ICD-10-CM | POA: Diagnosis not present

## 2017-08-04 DIAGNOSIS — I480 Paroxysmal atrial fibrillation: Secondary | ICD-10-CM

## 2017-08-04 DIAGNOSIS — I2510 Atherosclerotic heart disease of native coronary artery without angina pectoris: Secondary | ICD-10-CM | POA: Diagnosis not present

## 2017-08-04 NOTE — Patient Instructions (Signed)
Medication Instructions:  Your physician recommends that you continue on your current medications as directed. Please refer to the Current Medication list given to you today.  Labwork: CBC/BMET Orders given today   Testing/Procedures: NONE  Follow-Up: Your physician wants you to follow-up in: 6 MONTHS WITH DR. MCDOWELL. You will receive a reminder letter in the mail two months in advance. If you don't receive a letter, please call our office to schedule the follow-up appointment.  Any Other Special Instructions Will Be Listed Below (If Applicable).  If you need a refill on your cardiac medications before your next appointment, please call your pharmacy. 

## 2017-08-05 DIAGNOSIS — Z853 Personal history of malignant neoplasm of breast: Secondary | ICD-10-CM | POA: Diagnosis not present

## 2017-08-05 DIAGNOSIS — E039 Hypothyroidism, unspecified: Secondary | ICD-10-CM | POA: Diagnosis not present

## 2017-08-05 DIAGNOSIS — I4891 Unspecified atrial fibrillation: Secondary | ICD-10-CM | POA: Diagnosis not present

## 2017-08-05 DIAGNOSIS — M545 Low back pain: Secondary | ICD-10-CM | POA: Diagnosis not present

## 2017-08-12 DIAGNOSIS — H35033 Hypertensive retinopathy, bilateral: Secondary | ICD-10-CM | POA: Diagnosis not present

## 2017-08-13 DIAGNOSIS — M545 Low back pain: Secondary | ICD-10-CM | POA: Diagnosis not present

## 2017-08-13 DIAGNOSIS — M439 Deforming dorsopathy, unspecified: Secondary | ICD-10-CM | POA: Diagnosis not present

## 2017-08-13 DIAGNOSIS — M47816 Spondylosis without myelopathy or radiculopathy, lumbar region: Secondary | ICD-10-CM | POA: Diagnosis not present

## 2017-08-13 DIAGNOSIS — M5126 Other intervertebral disc displacement, lumbar region: Secondary | ICD-10-CM | POA: Diagnosis not present

## 2017-08-13 DIAGNOSIS — M48061 Spinal stenosis, lumbar region without neurogenic claudication: Secondary | ICD-10-CM | POA: Diagnosis not present

## 2017-08-25 DIAGNOSIS — I480 Paroxysmal atrial fibrillation: Secondary | ICD-10-CM | POA: Diagnosis not present

## 2017-08-27 ENCOUNTER — Telehealth: Payer: Self-pay | Admitting: *Deleted

## 2017-08-27 NOTE — Telephone Encounter (Signed)
-----   Message from Satira Sark, MD sent at 08/26/2017 12:43 PM EDT ----- Results reviewed.  Renal function stable.  Hemoglobin and platelets normal.  Continue with current plan. A copy of this test should be forwarded to [name], MD.

## 2017-08-27 NOTE — Telephone Encounter (Signed)
Patient informed and copy sent to PCP. 

## 2017-10-25 DIAGNOSIS — L03115 Cellulitis of right lower limb: Secondary | ICD-10-CM | POA: Diagnosis not present

## 2017-11-19 ENCOUNTER — Telehealth: Payer: Self-pay | Admitting: Cardiology

## 2017-11-19 MED ORDER — RIVAROXABAN 20 MG PO TABS: ORAL_TABLET | ORAL | 0 refills | Status: DC

## 2017-11-19 NOTE — Telephone Encounter (Signed)
Patient calling the office for samples of medication:   1.  What medication and dosage are you requesting samples for?  XARELTO 20 MG TABS tablet    2.  Are you currently out of this medication?  Patient states that she is in the donut hole with her insurance

## 2017-11-19 NOTE — Telephone Encounter (Signed)
Samples provided for patient.   

## 2017-12-08 ENCOUNTER — Encounter: Payer: Self-pay | Admitting: Internal Medicine

## 2017-12-08 DIAGNOSIS — E039 Hypothyroidism, unspecified: Secondary | ICD-10-CM | POA: Diagnosis not present

## 2017-12-08 DIAGNOSIS — I10 Essential (primary) hypertension: Secondary | ICD-10-CM | POA: Diagnosis not present

## 2017-12-08 DIAGNOSIS — Z0000 Encounter for general adult medical examination without abnormal findings: Secondary | ICD-10-CM | POA: Diagnosis not present

## 2017-12-08 DIAGNOSIS — E7800 Pure hypercholesterolemia, unspecified: Secondary | ICD-10-CM | POA: Diagnosis not present

## 2017-12-08 DIAGNOSIS — R002 Palpitations: Secondary | ICD-10-CM | POA: Diagnosis not present

## 2017-12-08 DIAGNOSIS — I4891 Unspecified atrial fibrillation: Secondary | ICD-10-CM | POA: Diagnosis not present

## 2017-12-15 DIAGNOSIS — I10 Essential (primary) hypertension: Secondary | ICD-10-CM | POA: Diagnosis not present

## 2017-12-15 DIAGNOSIS — E7800 Pure hypercholesterolemia, unspecified: Secondary | ICD-10-CM | POA: Diagnosis not present

## 2017-12-15 DIAGNOSIS — E039 Hypothyroidism, unspecified: Secondary | ICD-10-CM | POA: Diagnosis not present

## 2017-12-15 DIAGNOSIS — Z0000 Encounter for general adult medical examination without abnormal findings: Secondary | ICD-10-CM | POA: Diagnosis not present

## 2017-12-15 DIAGNOSIS — M545 Low back pain: Secondary | ICD-10-CM | POA: Diagnosis not present

## 2017-12-20 ENCOUNTER — Telehealth: Payer: Self-pay | Admitting: Cardiology

## 2017-12-20 MED ORDER — RIVAROXABAN 20 MG PO TABS: ORAL_TABLET | ORAL | 0 refills | Status: DC

## 2017-12-20 NOTE — Addendum Note (Signed)
Addended by: [name] on: 12/20/2017 10:28 AM   Modules accepted: Orders

## 2017-12-20 NOTE — Telephone Encounter (Signed)
Called patient. Notified that samples are avail. For pick in office. Pt voiced understanding.

## 2017-12-20 NOTE — Telephone Encounter (Signed)
Patient calling the office for samples of medication:   1.  What medication and dosage are you requesting samples for?  Xarelto  2.  Are you currently out of this medication? Has one day

## 2018-01-25 ENCOUNTER — Other Ambulatory Visit: Payer: Self-pay | Admitting: *Deleted

## 2018-01-25 MED ORDER — RIVAROXABAN 20 MG PO TABS: ORAL_TABLET | ORAL | 0 refills | Status: DC

## 2018-02-07 ENCOUNTER — Encounter: Payer: Self-pay | Admitting: Cardiology

## 2018-02-07 ENCOUNTER — Ambulatory Visit: Payer: Medicare Other | Admitting: Cardiology

## 2018-02-07 VITALS — BP 132/84 | HR 69 | Ht 64.0 in | Wt 202.0 lb

## 2018-02-07 DIAGNOSIS — I480 Paroxysmal atrial fibrillation: Secondary | ICD-10-CM

## 2018-02-07 DIAGNOSIS — I10 Essential (primary) hypertension: Secondary | ICD-10-CM | POA: Diagnosis not present

## 2018-02-07 DIAGNOSIS — I428 Other cardiomyopathies: Secondary | ICD-10-CM

## 2018-02-07 DIAGNOSIS — I2510 Atherosclerotic heart disease of native coronary artery without angina pectoris: Secondary | ICD-10-CM | POA: Diagnosis not present

## 2018-02-07 MED ORDER — RIVAROXABAN 20 MG PO TABS: ORAL_TABLET | ORAL | 0 refills | Status: DC

## 2018-02-07 NOTE — Progress Notes (Signed)
Cardiology Office Note  Date: 02/07/2018   ID: Keeva, Reisen 11-01-1945, MRN [id number]  PCP: [name], MD  Primary Cardiologist: Rozann Lesches, MD   Chief Complaint  Patient presents with  . Atrial Fibrillation    History of Present Illness: [name] Fry is a 72 y.o. female last seen in May.  She is here for a routine visit.  States that she is doing very well, no angina symptoms or palpitations.  Chronic, stable dyspnea on exertion that is mild.  She has a history of atrial fibrillation ablation, follows with Dr. [name].  She continues on Xarelto for stroke prophylaxis, reports no bleeding problems.  I reviewed her lab work from June as outlined below.  Be having follow-up lab work in early January per PCP.  Past Medical History:  Diagnosis Date  . Arthritis   . Atrial fibrillation [facility])    Documented January 2017  . Breast cancer ([place])    Left  . Cataracts, bilateral   . Coronary atherosclerosis of native coronary artery    DES OM 3/12  . Essential hypertension, benign   . GERD (gastroesophageal reflux disease)   . H/O hiatal hernia   . Hyperlipidemia   . Hypothyroidism   . Sleep apnea     Past Surgical History:  Procedure Laterality Date  . [place] VITRECTOMY WITH 20 GAUGE MVR PORT Right 09/29/2012   Procedure: 25 GAUGE PARS PLANA VITRECTOMY WITH 20 GAUGE MVR PORT;  Surgeon: Hayden Pedro, MD;  Location: [place];  Service: Ophthalmology;  Laterality: Right;  . APPENDECTOMY    . CARDIAC CATHETERIZATION N/A 07/26/2015   Procedure: Left Heart Cath and Coronary Angiography;  Surgeon: [name], MD;  Location: [place] [facility];  Service: Cardiovascular;  Laterality: N/A;  . CARDIOVERSION N/A 08/30/2015   Procedure: CARDIOVERSION;  Surgeon: Satira Sark, MD;  [facility];  Service: Cardiovascular;  Laterality: N/A;  . CATARACT EXTRACTION W/PHACO Right 05/18/2013   Procedure: CATARACT EXTRACTION PHACO AND INTRAOCULAR LENS  PLACEMENT RIGHT EYE;  Surgeon: Tonny Branch, MD;  [facility];  Service: Ophthalmology;  Laterality: Right;  CDE:13.35  . CATARACT EXTRACTION W/PHACO Left 02/10/2016   Procedure: CATARACT EXTRACTION PHACO AND INTRAOCULAR LENS PLACEMENT LEFT EYE: CDE:  5.39;  Surgeon: Tonny Branch, MD;  [facility];  Service: Ophthalmology;  Laterality: Left;  . CHOLECYSTECTOMY    . ELECTROPHYSIOLOGIC STUDY N/A 11/19/2015   Procedure: Atrial Fibrillation Ablation;  Surgeon: Thompson Grayer, MD;  Location: [place] [facility];  Service: Cardiovascular;  Laterality: N/A;  . GAS/FLUID EXCHANGE Right 09/29/2012   Procedure: GAS/FLUID EXCHANGE;  Surgeon: Hayden Pedro, MD;  Location: [place];  Service: Ophthalmology;  Laterality: Right;  . LASER PHOTO ABLATION Right 09/29/2012   Procedure: LASER PHOTO ABLATION;  Surgeon: Hayden Pedro, MD;  Location: [place];  Service: Ophthalmology;  Laterality: Right;  Headscope laser  . MASTECTOMY Left    1997; S/P chemotherapy/radiation  . MEMBRANE PEEL Right 09/29/2012   Procedure: MEMBRANE PEEL;  Surgeon: Hayden Pedro, MD;  Location: [place];  Service: Ophthalmology;  Laterality: Right;  . TEE WITHOUT CARDIOVERSION N/A 11/19/2015   Procedure: TRANSESOPHAGEAL ECHOCARDIOGRAM (TEE);  Surgeon: Thayer Headings, MD;  Location: [place];  Service: Cardiovascular;  Laterality: N/A;  . TUBAL LIGATION      Current Outpatient Medications  Medication Sig Dispense Refill  . acetaminophen (TYLENOL) 500 MG tablet Take 500-1,000 mg by mouth every 6 (six)  hours as needed for moderate pain or headache.    . Cyanocobalamin (VITAMIN B-12 PO) Take by mouth. Occasionally    . ezetimibe (ZETIA) 10 MG tablet Take 10 mg by mouth daily.  2  . furosemide (LASIX) 40 MG tablet Take 1 tablet (40 mg total) by mouth daily as needed for fluid (or weight gain). 30 tablet 2  . GARLIC OIL PO Take 1 capsule by mouth. Occasionally    . levothyroxine (SYNTHROID, LEVOTHROID) 125 MCG tablet Take 125 mcg by mouth  every morning.  3  . losartan-hydrochlorothiazide (HYZAAR) 50-12.5 MG tablet Take 1 tablet by mouth daily.    . metoprolol succinate (TOPROL-XL) 25 MG 24 hr tablet TAKE ONE TABLET BY MOUTH EVERY DAY 90 tablet 3  . Multiple Vitamins-Minerals (OCUVITE PRESERVISION PO) Take 1 tablet by mouth 2 (two) times daily.     Marland Kitchen NITROSTAT 0.4 MG SL tablet PLACE 1 TABLET UNDER THE TONGUE EVERY 5 MINUTES FOR 3 DOSES AS NEEDED CHEST PAIN 25 tablet 3  . Omega-3 Fatty Acids (FISH OIL) 1000 MG CAPS Take 1 capsule by mouth. Occasionally    . rivaroxaban (XARELTO) 20 MG TABS tablet TAKE ONE TABLET BY MOUTH EVERY DAY with SUPPER (pt. needs APPOINTMENT. FOR further refills) 21 tablet 0   No current facility-administered medications for this visit.    Allergies:  Doxycycline   Social History: The patient  reports that she has never smoked. She has never used smokeless tobacco. She reports that she does not drink alcohol or use drugs.   ROS:  Please see the history of present illness. Otherwise, complete review of systems is positive for none.  All other systems are reviewed and negative.   Physical Exam: VS:  BP 132/84   Pulse 69   Ht 5\' 4"  (1.626 m)   Wt 202 lb (91.6 kg)   LMP 09/28/2012   SpO2 96%   BMI 34.67 kg/m , BMI Body mass index is 34.67 kg/m.  Wt Readings from Last 3 Encounters:  02/07/18 202 lb (91.6 kg)  08/04/17 209 lb 12.8 oz (95.2 kg)  06/04/17 208 lb (94.3 kg)    General: Patient appears comfortable at rest. HEENT: Conjunctiva and lids normal, oropharynx clear. Neck: Supple, no elevated JVP or carotid bruits. Lungs: Clear to auscultation, nonlabored breathing at rest. Cardiac: Regular rate and rhythm, no S3 or significant systolic murmur. Abdomen: Soft, nontender, bowel sounds present. Extremities: Trace ankle edema, distal pulses 2+. Skin: Warm and dry. Musculoskeletal: No kyphosis. Neuropsychiatric: Alert and oriented x3, affect grossly appropriate.  ECG: I personally reviewed the  tracing from 06/04/2017 which showed sinus rhythm with left bundle branch block.  Recent Labwork:  June 2019: BUN 17, creatinine 1.03, potassium 4.0, hemoglobin 12.1, platelets 279  Other Studies Reviewed Today:  Echocardiogram 02/04/2016: Study Conclusions  - Left ventricle: The cavity size was normal. Wall thickness was   increased in a pattern of mild LVH. Systolic function was normal.   The estimated ejection fraction was in the range of 55% to 60%.   Diastolic function is abnormal, indeterminate grade. There is   evidence of elevated LA pressure. Wall motion was normal; there   were no regional wall motion abnormalities. - Aortic valve: Mildly calcified annulus. Trileaflet; normal   thickness leaflets. Valve area (VTI): 1.88 cm^2. Valve area   (Vmax): 1.78 cm^2. Valve area (Vmean): 1.7 cm^2. - Mitral valve: Mildly calcified annulus. Mildly thickened leaflets   . There was mild regurgitation. - Technically adequate study.  Assessment and Plan:  1.  Paroxysmal atrial fibrillation status post atrial fibrillation ablation.  She continues on Toprol-XL and Xarelto.  No reported bleeding episodes.  She will be having follow-up lab work with PCP in January.  2.  Nonischemic cardia myopathy with normalization of LVEF.  3.  CAD status post DES to the obtuse marginal in 2012.  No active angina symptoms with patent stent site as of 2017.  She is not on aspirin given concurrent use of Xarelto.  4.  Essential hypertension, blood pressure is adequately controlled today.  Keep follow-up with PCP.  Current medicines were reviewed with the patient today.  Disposition: Follow-up in 6 months.  Signed, Satira Sark, MD, [facility] 02/07/2018 2:20 PM    [facility] [place], [place], [place] [address] Phone: [phone]; Fax: [phone]

## 2018-02-07 NOTE — Patient Instructions (Addendum)

## 2018-02-28 ENCOUNTER — Encounter: Payer: Self-pay | Admitting: Internal Medicine

## 2018-02-28 DIAGNOSIS — Z1211 Encounter for screening for malignant neoplasm of colon: Secondary | ICD-10-CM | POA: Diagnosis not present

## 2018-02-28 DIAGNOSIS — Z1212 Encounter for screening for malignant neoplasm of rectum: Secondary | ICD-10-CM | POA: Diagnosis not present

## 2018-02-28 LAB — COLOGUARD

## 2018-03-07 ENCOUNTER — Telehealth: Payer: Self-pay | Admitting: Cardiology

## 2018-03-07 MED ORDER — RIVAROXABAN 20 MG PO TABS: ORAL_TABLET | ORAL | 0 refills | Status: DC

## 2018-03-07 NOTE — Telephone Encounter (Signed)
Patient calling the office for samples of medication:   1.  What medication and dosage are you requesting samples for?   Xarelto 20   2.  Are you currently out of this medication? States that she needs 7 pills

## 2018-03-07 NOTE — Telephone Encounter (Addendum)
No answer.  Samples are ready for pick up.

## 2018-03-08 NOTE — Telephone Encounter (Signed)
Patient aware.  Stated that she came by office yesterday & picked up.

## 2018-03-17 DIAGNOSIS — E7800 Pure hypercholesterolemia, unspecified: Secondary | ICD-10-CM | POA: Diagnosis not present

## 2018-03-21 DIAGNOSIS — E7800 Pure hypercholesterolemia, unspecified: Secondary | ICD-10-CM | POA: Diagnosis not present

## 2018-03-21 DIAGNOSIS — E039 Hypothyroidism, unspecified: Secondary | ICD-10-CM | POA: Diagnosis not present

## 2018-03-31 ENCOUNTER — Ambulatory Visit (INDEPENDENT_AMBULATORY_CARE_PROVIDER_SITE_OTHER): Payer: Medicare Other | Admitting: Internal Medicine

## 2018-03-31 ENCOUNTER — Encounter (INDEPENDENT_AMBULATORY_CARE_PROVIDER_SITE_OTHER): Payer: Self-pay | Admitting: *Deleted

## 2018-03-31 ENCOUNTER — Telehealth (INDEPENDENT_AMBULATORY_CARE_PROVIDER_SITE_OTHER): Payer: Self-pay | Admitting: *Deleted

## 2018-03-31 ENCOUNTER — Encounter (INDEPENDENT_AMBULATORY_CARE_PROVIDER_SITE_OTHER): Payer: Self-pay | Admitting: Internal Medicine

## 2018-03-31 VITALS — BP 163/87 | HR 78 | Temp 97.7°F | Ht 64.0 in | Wt 202.2 lb

## 2018-03-31 DIAGNOSIS — R195 Other fecal abnormalities: Secondary | ICD-10-CM | POA: Insufficient documentation

## 2018-03-31 MED ORDER — PEG 3350-KCL-NA BICARB-NACL 420 G PO SOLR: 4000.0000 mL | Freq: Once | ORAL | 0 refills | Status: AC

## 2018-03-31 NOTE — Telephone Encounter (Signed)
Patient aware.

## 2018-03-31 NOTE — Progress Notes (Signed)
Subjective:    Patient ID: [name] Fry, female    DOB: Dec 31, 1945, 73 y.o.   MRN: [id number]  HPI Referred by Dr. Nadara Mustard for positive cologuard. Denies any rectal bleeding. Stools seem smaller and are loose. Stools have been loose since her GB removed. Maternal aunt ? Had colon cancer ?age. Lived to be 85. Never had a colonoscopy in the past. Her appetite is good. No weight loss.   12/08/2017 Hemoglobin 11.8 Maintained on cardiac stent and maintained on Xarelto Hx of atrial fib.  Review of Systems Past Medical History:  Diagnosis Date  . Arthritis   . Atrial fibrillation [facility])    Documented January 2017  . Breast cancer ([place])    Left  . Cataracts, bilateral   . Coronary atherosclerosis of native coronary artery    DES OM 3/12  . Essential hypertension, benign   . GERD (gastroesophageal reflux disease)   . H/O hiatal hernia   . Hyperlipidemia   . Hypothyroidism   . Sleep apnea     Past Surgical History:  Procedure Laterality Date  . [place] VITRECTOMY WITH 20 GAUGE MVR PORT Right 09/29/2012   Procedure: 25 GAUGE PARS [place] VITRECTOMY WITH 20 GAUGE MVR PORT;  Surgeon: Hayden Pedro, MD;  Location: [place];  Service: Ophthalmology;  Laterality: Right;  . APPENDECTOMY    . CARDIAC CATHETERIZATION N/A 07/26/2015   Procedure: Left Heart Cath and Coronary Angiography;  Surgeon: [name], MD;  Location: [place] [facility];  Service: Cardiovascular;  Laterality: N/A;  . CARDIOVERSION N/A 08/30/2015   Procedure: CARDIOVERSION;  Surgeon: Satira Sark, MD;  [facility];  Service: Cardiovascular;  Laterality: N/A;  . CATARACT EXTRACTION W/PHACO Right 05/18/2013   Procedure: CATARACT EXTRACTION PHACO AND INTRAOCULAR LENS PLACEMENT RIGHT EYE;  Surgeon: Tonny Branch, MD;  [facility]: [facility];  Service: Ophthalmology;  Laterality: Right;  CDE:13.35  . CATARACT EXTRACTION W/PHACO Left 02/10/2016   Procedure: CATARACT EXTRACTION PHACO AND INTRAOCULAR LENS PLACEMENT  LEFT EYE: CDE:  5.39;  Surgeon: Tonny Branch, MD;  [facility];  Service: Ophthalmology;  Laterality: Left;  . CHOLECYSTECTOMY    . ELECTROPHYSIOLOGIC STUDY N/A 11/19/2015   Procedure: Atrial Fibrillation Ablation;  Surgeon: Thompson Grayer, MD;  Location: [place] [facility];  Service: Cardiovascular;  Laterality: N/A;  . GAS/FLUID EXCHANGE Right 09/29/2012   Procedure: GAS/FLUID EXCHANGE;  Surgeon: Hayden Pedro, MD;  Location: [place];  Service: Ophthalmology;  Laterality: Right;  . LASER PHOTO ABLATION Right 09/29/2012   Procedure: LASER PHOTO ABLATION;  Surgeon: Hayden Pedro, MD;  Location: [place];  Service: Ophthalmology;  Laterality: Right;  Headscope laser  . MASTECTOMY Left    1997; S/P chemotherapy/radiation  . MEMBRANE PEEL Right 09/29/2012   Procedure: MEMBRANE PEEL;  Surgeon: Hayden Pedro, MD;  Location: [place];  Service: Ophthalmology;  Laterality: Right;  . TEE WITHOUT CARDIOVERSION N/A 11/19/2015   Procedure: TRANSESOPHAGEAL ECHOCARDIOGRAM (TEE);  Surgeon: Thayer Headings, MD;  Location: [place];  Service: Cardiovascular;  Laterality: N/A;  . TUBAL LIGATION      Allergies  Allergen Reactions  . Doxycycline Rash    Current Outpatient Medications on File Prior to Visit  Medication Sig Dispense Refill  . acetaminophen (TYLENOL) 500 MG tablet Take 500-1,000 mg by mouth every 6 (six) hours as needed for moderate pain or headache.    . ezetimibe (ZETIA) 10 MG tablet Take 10 mg by mouth daily.  2  .  furosemide (LASIX) 40 MG tablet Take 1 tablet (40 mg total) by mouth daily as needed for fluid (or weight gain). 30 tablet 2  . GARLIC OIL PO Take 1 capsule by mouth. Occasionally    . levothyroxine (SYNTHROID, LEVOTHROID) 125 MCG tablet Take 125 mcg by mouth every morning.  3  . losartan-hydrochlorothiazide (HYZAAR) 50-12.5 MG tablet Take 1 tablet by mouth daily.    . metoprolol succinate (TOPROL-XL) 25 MG 24 hr tablet TAKE ONE TABLET BY MOUTH EVERY DAY 90 tablet 3  . Multiple  Vitamins-Minerals (OCUVITE PRESERVISION PO) Take 1 tablet by mouth 2 (two) times daily.     Marland Kitchen NITROSTAT 0.4 MG SL tablet PLACE 1 TABLET UNDER THE TONGUE EVERY 5 MINUTES FOR 3 DOSES AS NEEDED CHEST PAIN 25 tablet 3  . rivaroxaban (XARELTO) 20 MG TABS tablet TAKE ONE TABLET BY MOUTH EVERY DAY with SUPPER (pt. needs APPOINTMENT. FOR further refills) 7 tablet 0  . Specialty Vitamins Products (ECHINACEA C COMPLETE PO) Take 760 mg by mouth daily.     No current facility-administered medications on file prior to visit.         Objective:   Physical Exam Blood pressure (!) 163/87, pulse 78, temperature 97.7 F (36.5 C), height 5\' 4"  (1.626 m), weight 202 lb 3.2 oz (91.7 kg), last menstrual period 09/28/2012. Alert and oriented. Skin warm and dry. Oral mucosa is moist.   . Sclera anicteric, conjunctivae is pink. Thyroid not enlarged. No cervical lymphadenopathy. Lungs clear. Heart regular rate and rhythm.  Abdomen is soft. Bowel sounds are positive. No hepatomegaly. No abdominal masses felt. No tenderness.  No edema to lower extremities.           Assessment & Plan:  Positive cologuard. The risks of bleeding, perforation and infection were reviewed with patient.

## 2018-03-31 NOTE — Patient Instructions (Addendum)
The risks of bleeding, perforation and infection were reviewed with patient.  

## 2018-03-31 NOTE — Telephone Encounter (Signed)
Yes, she may hold Xarelto as requested for colonoscopy.

## 2018-03-31 NOTE — Telephone Encounter (Signed)
Patient is scheduled for colonoscopy 05/02/18 -- needs to stop Xarelto 2 days before -- please advise

## 2018-03-31 NOTE — Telephone Encounter (Signed)
Patient needs trilyte 

## 2018-04-15 ENCOUNTER — Other Ambulatory Visit: Payer: Self-pay | Admitting: Cardiology

## 2018-04-15 MED ORDER — RIVAROXABAN 20 MG PO TABS: ORAL_TABLET | ORAL | 6 refills | Status: DC

## 2018-04-15 NOTE — Telephone Encounter (Signed)
° °  1. Which medications need to be refilled? (please list name of each medication and dose if known) rivaroxaban (XARELTO) 20 MG TABS    2. Which pharmacy/location (including street and city if local pharmacy) is medication to be sent to?  [facility]   3. Do they need a 30 day or 90 day supply?    Patient has only 1 pill left -states [facility] faxed request over a week ago.

## 2018-05-02 ENCOUNTER — Ambulatory Visit (HOSPITAL_COMMUNITY)
Admission: RE | Admit: 2018-05-02 | Discharge: 2018-05-02 | Disposition: A | Discharge status: Home / Self Care | Payer: Medicare Other | Attending: Internal Medicine | Admitting: Internal Medicine

## 2018-05-02 ENCOUNTER — Encounter (HOSPITAL_COMMUNITY)
Admission: RE | Disposition: A | Discharge status: Home / Self Care | Payer: Self-pay | Source: Home / Self Care | Attending: Internal Medicine

## 2018-05-02 ENCOUNTER — Encounter (HOSPITAL_COMMUNITY): Payer: Self-pay | Admitting: *Deleted

## 2018-05-02 ENCOUNTER — Other Ambulatory Visit: Payer: Self-pay

## 2018-05-02 DIAGNOSIS — K648 Other hemorrhoids: Secondary | ICD-10-CM | POA: Diagnosis not present

## 2018-05-02 DIAGNOSIS — Z79890 Hormone replacement therapy (postmenopausal): Secondary | ICD-10-CM | POA: Diagnosis not present

## 2018-05-02 DIAGNOSIS — Z7901 Long term (current) use of anticoagulants: Secondary | ICD-10-CM | POA: Diagnosis not present

## 2018-05-02 DIAGNOSIS — Z9221 Personal history of antineoplastic chemotherapy: Secondary | ICD-10-CM | POA: Insufficient documentation

## 2018-05-02 DIAGNOSIS — Z8249 Family history of ischemic heart disease and other diseases of the circulatory system: Secondary | ICD-10-CM | POA: Insufficient documentation

## 2018-05-02 DIAGNOSIS — I2510 Atherosclerotic heart disease of native coronary artery without angina pectoris: Secondary | ICD-10-CM | POA: Diagnosis not present

## 2018-05-02 DIAGNOSIS — I10 Essential (primary) hypertension: Secondary | ICD-10-CM | POA: Insufficient documentation

## 2018-05-02 DIAGNOSIS — D125 Benign neoplasm of sigmoid colon: Secondary | ICD-10-CM | POA: Insufficient documentation

## 2018-05-02 DIAGNOSIS — Z9841 Cataract extraction status, right eye: Secondary | ICD-10-CM | POA: Insufficient documentation

## 2018-05-02 DIAGNOSIS — M1990 Unspecified osteoarthritis, unspecified site: Secondary | ICD-10-CM | POA: Diagnosis not present

## 2018-05-02 DIAGNOSIS — Z79899 Other long term (current) drug therapy: Secondary | ICD-10-CM | POA: Insufficient documentation

## 2018-05-02 DIAGNOSIS — E039 Hypothyroidism, unspecified: Secondary | ICD-10-CM | POA: Diagnosis not present

## 2018-05-02 DIAGNOSIS — R195 Other fecal abnormalities: Secondary | ICD-10-CM | POA: Diagnosis not present

## 2018-05-02 DIAGNOSIS — Z9012 Acquired absence of left breast and nipple: Secondary | ICD-10-CM | POA: Diagnosis not present

## 2018-05-02 DIAGNOSIS — K5730 Diverticulosis of large intestine without perforation or abscess without bleeding: Secondary | ICD-10-CM | POA: Diagnosis not present

## 2018-05-02 DIAGNOSIS — K6289 Other specified diseases of anus and rectum: Secondary | ICD-10-CM | POA: Diagnosis not present

## 2018-05-02 DIAGNOSIS — Z9049 Acquired absence of other specified parts of digestive tract: Secondary | ICD-10-CM | POA: Insufficient documentation

## 2018-05-02 DIAGNOSIS — I4891 Unspecified atrial fibrillation: Secondary | ICD-10-CM | POA: Diagnosis not present

## 2018-05-02 DIAGNOSIS — G4730 Sleep apnea, unspecified: Secondary | ICD-10-CM | POA: Insufficient documentation

## 2018-05-02 DIAGNOSIS — Z853 Personal history of malignant neoplasm of breast: Secondary | ICD-10-CM | POA: Diagnosis not present

## 2018-05-02 DIAGNOSIS — K635 Polyp of colon: Secondary | ICD-10-CM | POA: Insufficient documentation

## 2018-05-02 DIAGNOSIS — Z961 Presence of intraocular lens: Secondary | ICD-10-CM | POA: Insufficient documentation

## 2018-05-02 DIAGNOSIS — E785 Hyperlipidemia, unspecified: Secondary | ICD-10-CM | POA: Diagnosis not present

## 2018-05-02 DIAGNOSIS — D123 Benign neoplasm of transverse colon: Secondary | ICD-10-CM | POA: Diagnosis not present

## 2018-05-02 DIAGNOSIS — Z881 Allergy status to other antibiotic agents status: Secondary | ICD-10-CM | POA: Diagnosis not present

## 2018-05-02 DIAGNOSIS — Z9842 Cataract extraction status, left eye: Secondary | ICD-10-CM | POA: Diagnosis not present

## 2018-05-02 HISTORY — PX: COLONOSCOPY: SHX5424

## 2018-05-02 HISTORY — PX: POLYPECTOMY: SHX5525

## 2018-05-02 SURGERY — COLONOSCOPY
Anesthesia: Moderate Sedation

## 2018-05-02 MED ORDER — STERILE WATER FOR IRRIGATION IR SOLN
Status: DC | PRN
  Administered 2018-05-02: 1.5 mL

## 2018-05-02 MED ORDER — MEPERIDINE HCL 50 MG/ML IJ SOLN
INTRAMUSCULAR | Status: DC | PRN
  Administered 2018-05-02 (×2): 25 mg via INTRAVENOUS

## 2018-05-02 MED ORDER — MIDAZOLAM HCL 5 MG/5ML IJ SOLN
INTRAMUSCULAR | Status: DC | PRN
  Administered 2018-05-02 (×2): 2 mg via INTRAVENOUS
  Administered 2018-05-02: 1 mg via INTRAVENOUS

## 2018-05-02 MED ORDER — MIDAZOLAM HCL 5 MG/5ML IJ SOLN
INTRAMUSCULAR | Status: AC
  Filled 2018-05-02: qty 10

## 2018-05-02 MED ORDER — SODIUM CHLORIDE 0.9 % IV SOLN
INTRAVENOUS | Status: DC
  Administered 2018-05-02: 12:00:00 via INTRAVENOUS

## 2018-05-02 MED ORDER — MEPERIDINE HCL 50 MG/ML IJ SOLN
INTRAMUSCULAR | Status: AC
  Filled 2018-05-02: qty 1

## 2018-05-02 NOTE — H&P (Addendum)
[name] Fry is an 73 y.o. female.   Chief Complaint: Patient is here for colonoscopy. HPI: Patient is 73 year old Caucasian female with multiple medical problems who had Cologuard testing as a screening tool and it came back positive.  Patient denies change in bowel habits or rectal bleeding.  She states ever since she had cholecystectomy she has had frequent bowel movements ranging from 2 to 6/day.  Her stools are formed or semi-formed.  She states she does not have diarrhea.  She has good appetite and has not lost any weight. Last Xarelto dose was on 04/29/2018. She has never been screened for CRC prior to this test. Family history is negative for CRC.   Past Medical History:  Diagnosis Date  . Arthritis   . H/O Atrial fibrillation [place] ([name])    Documented January 2017  . Breast cancer ([place])    Left  . Cataracts, bilateral   . Coronary atherosclerosis of native coronary artery    DES OM 3/12  . Essential hypertension, benign   . GERD (gastroesophageal reflux disease)   . H/O hiatal hernia   . Hyperlipidemia   . Hypothyroidism   . Sleep apnea     Past Surgical History:  Procedure Laterality Date  . [place] VITRECTOMY WITH 20 GAUGE MVR PORT Right 09/29/2012   Procedure: 25 GAUGE PARS PLANA VITRECTOMY WITH 20 GAUGE MVR PORT;  Surgeon: Hayden Pedro, MD;  Location: [place];  Service: Ophthalmology;  Laterality: Right;  . APPENDECTOMY    . CARDIAC CATHETERIZATION N/A 07/26/2015   Procedure: Left Heart Cath and Coronary Angiography;  Surgeon: [name], MD;  Location: [place] [facility];  Service: Cardiovascular;  Laterality: N/A;  . CARDIOVERSION N/A 08/30/2015   Procedure: CARDIOVERSION;  Surgeon: Satira Sark, MD;  [facility];  Service: Cardiovascular;  Laterality: N/A;  . CATARACT EXTRACTION W/PHACO Right 05/18/2013   Procedure: CATARACT EXTRACTION PHACO AND INTRAOCULAR LENS PLACEMENT RIGHT EYE;  Surgeon: Tonny Branch, MD;  [facility];  Service:  Ophthalmology;  Laterality: Right;  CDE:13.35  . CATARACT EXTRACTION W/PHACO Left 02/10/2016   Procedure: CATARACT EXTRACTION PHACO AND INTRAOCULAR LENS PLACEMENT LEFT EYE: CDE:  5.39;  Surgeon: Tonny Branch, MD;  [facility];  Service: Ophthalmology;  Laterality: Left;  . CHOLECYSTECTOMY    . ELECTROPHYSIOLOGIC STUDY N/A 11/19/2015   Procedure: Atrial Fibrillation Ablation;  Surgeon: Thompson Grayer, MD;  Location: [place] [facility];  Service: Cardiovascular;  Laterality: N/A;  . GAS/FLUID EXCHANGE Right 09/29/2012   Procedure: GAS/FLUID EXCHANGE;  Surgeon: Hayden Pedro, MD;  Location: [place];  Service: Ophthalmology;  Laterality: Right;  . LASER PHOTO ABLATION Right 09/29/2012   Procedure: LASER PHOTO ABLATION;  Surgeon: Hayden Pedro, MD;  Location: [place];  Service: Ophthalmology;  Laterality: Right;  Headscope laser  . MASTECTOMY Left    1997; S/P chemotherapy/radiation  . MEMBRANE PEEL Right 09/29/2012   Procedure: MEMBRANE PEEL;  Surgeon: Hayden Pedro, MD;  Location: [place];  Service: Ophthalmology;  Laterality: Right;  . TEE WITHOUT CARDIOVERSION N/A 11/19/2015   Procedure: TRANSESOPHAGEAL ECHOCARDIOGRAM (TEE);  Surgeon: Thayer Headings, MD;  Location: [facility];  Service: Cardiovascular;  Laterality: N/A;  . TUBAL LIGATION      Family History  Problem Relation Age of Onset  . Heart attack Father 74   Social History:  reports that she has never smoked. She has never used smokeless tobacco. She reports that she does not drink alcohol or  use drugs.  Allergies:  Allergies  Allergen Reactions  . Doxycycline Rash    Medications Prior to Admission  Medication Sig Dispense Refill  . acetaminophen (TYLENOL) 500 MG tablet Take 500-1,000 mg by mouth every 6 (six) hours as needed for moderate pain or headache.    . ezetimibe (ZETIA) 10 MG tablet Take 10 mg by mouth daily.  2  . GARLIC OIL PO Take 1 capsule by mouth as needed.     Marland Kitchen levothyroxine (SYNTHROID, LEVOTHROID) 125 MCG  tablet Take 125 mcg by mouth daily before breakfast.   3  . losartan-hydrochlorothiazide (HYZAAR) 50-12.5 MG tablet Take 1 tablet by mouth daily.    . metoprolol succinate (TOPROL-XL) 25 MG 24 hr tablet TAKE ONE TABLET BY MOUTH EVERY DAY (Patient taking differently: Take 25 mg by mouth daily. ) 90 tablet 3  . Multiple Vitamins-Minerals (OCUVITE PRESERVISION PO) Take 1 tablet by mouth 2 (two) times daily.     . rivaroxaban (XARELTO) 20 MG TABS tablet TAKE ONE TABLET BY MOUTH EVERY DAY with SUPPER (pt. needs APPOINTMENT. FOR further refills) (Patient taking differently: Take 20 mg by mouth daily with supper. TAKE ONE TABLET BY MOUTH EVERY DAY with SUPPER (pt. needs APPOINTMENT. FOR further refills)) 30 tablet 6  . furosemide (LASIX) 40 MG tablet Take 1 tablet (40 mg total) by mouth daily as needed for fluid (or weight gain). 30 tablet 2  . NITROSTAT 0.4 MG SL tablet PLACE 1 TABLET UNDER THE TONGUE EVERY 5 MINUTES FOR 3 DOSES AS NEEDED CHEST PAIN (Patient taking differently: Place 0.4 mg under the tongue every 5 (five) minutes as needed for chest pain. ) 25 tablet 3  . Specialty Vitamins Products (ECHINACEA C COMPLETE PO) Take 760 mg by mouth daily as needed (cold symptoms).       No results found for this or any previous visit (from the past 48 hour(s)). No results found.  ROS  Blood pressure (!) 171/73, pulse 96, temperature 98.4 F (36.9 C), temperature source Oral, resp. rate 16, height 5\' 4"  (1.626 m), weight 91.7 kg, last menstrual period 09/28/2012, SpO2 100 %. Physical Exam  Constitutional: She appears well-developed and well-nourished.  HENT:  Mouth/Throat: Oropharynx is clear and moist.  Eyes: Conjunctivae are normal. No scleral icterus.  Neck: No thyromegaly present.  Cardiovascular: Normal rate, regular rhythm and normal heart sounds.  No murmur heard. Respiratory: Effort normal and breath sounds normal.  GI:  Abdomen is symmetrical.  Laparoscopy scars noted across upper abdomen.   1 of the scars is about 3 cm long. Is soft and nontender without organomegaly or masses.  Musculoskeletal:        General: No edema.  Lymphadenopathy:    She has no cervical adenopathy.  Neurological: She is alert.  Skin: Skin is warm and dry.     Assessment/Plan Positive Cologuard test. Diagnostic colonoscopy.  Hildred Laser, MD 05/02/2018, 12:42 PM

## 2018-05-02 NOTE — Op Note (Addendum)
[facility] Patient Name: [name] Fry Procedure Date: 05/02/2018 12:02 PM MRN: [id number] Date of Birth: Feb 21, 1946 Attending MD: Hildred Laser , MD CSN: [id number] Age: 73 Admit Type: Outpatient Procedure:                Colonoscopy Indications:              Positive Cologuard test Providers:                Hildred Laser, MD, Charlsie Quest. Theda Sers RN, RN, Aram Candela Referring MD:             Crissie Sickles. Nadara Mustard, MD Medicines:                Meperidine 50 mg IV, Midazolam 5 mg IV Complications:            No immediate complications. Estimated Blood Loss:     Estimated blood loss was minimal. Procedure:                Pre-Anesthesia Assessment:                           - Prior to the procedure, a History and Physical                            was performed, and patient medications and                            allergies were reviewed. The patient's tolerance of                            previous anesthesia was also reviewed. The risks                            and benefits of the procedure and the sedation                            options and risks were discussed with the patient.                            All questions were answered, and informed consent                            was obtained. Prior Anticoagulants: The patient                            last took Xarelto (rivaroxaban) 3 days prior to the                            procedure. ASA Grade Assessment: III - A patient                            with severe systemic disease. After reviewing the  risks and benefits, the patient was deemed in                            satisfactory condition to undergo the procedure.                           After obtaining informed consent, the colonoscope                            was passed under direct vision. Throughout the                            procedure, the patient's blood pressure, pulse, and                             oxygen saturations were monitored continuously. The                            CF-[id number] ([id number]) scope was introduced through                            the anus and advanced to the the cecum, identified                            by appendiceal orifice and ileocecal valve. The                            colonoscopy was performed without difficulty. The                            patient tolerated the procedure well. The quality                            of the bowel preparation was excellent. The                            ileocecal valve, appendiceal orifice, and rectum                            were photographed. Scope In: 12:52:51 PM Scope Out: 1:27:45 PM Scope Withdrawal Time: 0 hours 25 minutes 8 seconds  Total Procedure Duration: 0 hours 34 minutes 54 seconds  Findings:      The perianal and digital rectal examinations were normal.      A 7 mm polyp was found in the transverse colon. The polyp was       semi-sessile. The polyp was removed with a cold snare. Resection and       retrieval were complete. The pathology specimen was placed into Bottle       Number 1.      A 5 mm polyp was found in the mid sigmoid colon. The polyp was sessile.       The polyp was removed with a cold snare. Resection and retrieval were       complete. The pathology specimen was placed into Bottle Number 1.  A 7 mm polyp was found in the distal sigmoid colon. The polyp was       semi-pedunculated. The polyp was removed with a cold snare. Resection       and retrieval were complete. To stop active bleeding, one hemostatic       clip was successfully placed (MR conditional). There was no bleeding at       the end of the procedure. The pathology specimen was placed into Bottle       Number 1.      A few small-mouthed diverticula were found in the sigmoid colon.      Internal hemorrhoids were found during retroflexion. The hemorrhoids       were small.      Anal papilla(e) were  hypertrophied. Impression:               - One 7 mm polyp in the transverse colon, removed                            with a cold snare. Resected and retrieved.                           - One 5 mm polyp in the mid sigmoid colon, removed                            with a cold snare. Resected and retrieved.                           - One 7 mm polyp in the distal sigmoid colon,                            removed with a cold snare. Resected and retrieved.                            Clip (MR conditional) was placed.                           - Diverticulosis in the sigmoid colon.                           - Internal hemorrhoids.                           - Anal papilla(e) were hypertrophied. Moderate Sedation:      Moderate (conscious) sedation was administered by the endoscopy nurse       and supervised by the endoscopist. The following parameters were       monitored: oxygen saturation, heart rate, blood pressure, CO2       capnography and response to care. Total physician intraservice time was       40 minutes. Recommendation:           - Patient has a contact number available for                            emergencies. The signs and symptoms of potential  delayed complications were discussed with the                            patient. Return to normal activities tomorrow.                            Written discharge instructions were provided to the                            patient.                           - High fiber diet today.                           - Continue present medications.                           - Resume Xarelto (rivaroxaban) at prior dose                            tomorrow.                           - Await pathology results.                           - Repeat colonoscopy is recommended. The                            colonoscopy date will be determined after pathology                            results from today's exam become  available for                            review. Procedure Code(s):        --- Professional ---                           [id number], Colonoscopy, flexible; with removal of                            tumor(s), polyp(s), or other lesion(s) by snare                            technique                           99153, Moderate sedation; each additional 15                            minutes intraservice time                           99153, Moderate sedation; each additional 15  minutes intraservice time                           G0500, Moderate sedation services provided by the                            same physician or other qualified health care                            professional performing a gastrointestinal                            endoscopic service that sedation supports,                            requiring the presence of an independent trained                            observer to assist in the monitoring of the                            patient's level of consciousness and physiological                            status; initial 15 minutes of intra-service time;                            patient age 77 years or older (additional time may                            be reported with [phone], as appropriate) Diagnosis Code(s):        --- Professional ---                           D12.3, Benign neoplasm of transverse colon (hepatic                            flexure or splenic flexure)                           D12.5, Benign neoplasm of sigmoid colon                           K64.8, Other hemorrhoids                           K62.89, Other specified diseases of anus and rectum                           R19.5, Other fecal abnormalities                           K57.30, Diverticulosis of large intestine without                            perforation or  abscess without bleeding CPT copyright 2018 [employer]. All rights reserved. The codes  documented in this report are preliminary and upon coder review may  be revised to meet current compliance requirements. Hildred Laser, MD Hildred Laser, MD 05/02/2018 1:38:58 PM This report has been signed electronically. Number of Addenda: 0

## 2018-05-02 NOTE — Discharge Instructions (Signed)
Resume Xarelto on 05/03/2018. Resume other medications as before. High-fiber diet. No driving for 24 hours. Physician will call with biopsy results.   Colonoscopy, Adult, Care After This sheet gives you information about how to care for yourself after your procedure. Your doctor may also give you more specific instructions. If you have problems or questions, call your doctor. What can I expect after the procedure? After the procedure, it is common to have:  A small amount of blood in your poop for 24 hours.  Some gas.  Mild cramping or bloating in your belly. Follow these instructions at home: General instructions  For the first 24 hours after the procedure: ? Do not drive or use machinery. ? Do not sign important documents. ? Do not drink alcohol. ? Do your daily activities more slowly than normal. ? Eat foods that are soft and easy to digest.  Take over-the-counter or prescription medicines only as told by your doctor. To help cramping and bloating:   Try walking around.  Put heat on your belly (abdomen) as told by your doctor. Use a heat source that your doctor recommends, such as a moist heat pack or a heating pad. ? Put a towel between your skin and the heat source. ? Leave the heat on for 20-30 minutes. ? Remove the heat if your skin turns bright red. This is especially important if you cannot feel pain, heat, or cold. You can get burned. Eating and drinking   Drink enough fluid to keep your pee (urine) clear or pale yellow.  Return to your normal diet as told by your doctor. Avoid heavy or fried foods that are hard to digest.  Avoid drinking alcohol for as long as told by your doctor. Contact a doctor if:  You have blood in your poop (stool) 2-3 days after the procedure. Get help right away if:  You have more than a small amount of blood in your poop.  You see large clumps of tissue (blood clots) in your poop.  Your belly is swollen.  You feel sick to  your stomach (nauseous).  You throw up (vomit).  You have a fever.  You have belly pain that gets worse, and medicine does not help your pain. Summary  After the procedure, it is common to have a small amount of blood in your poop. You may also have mild cramping and bloating in your belly.  For the first 24 hours after the procedure, do not drive or use machinery, do not sign important documents, and do not drink alcohol.  Get help right away if you have a lot of blood in your poop, feel sick to your stomach, have a fever, or have more belly pain. This information is not intended to replace advice given to you by your health care provider. Make sure you discuss any questions you have with your health care provider. Document Released: 04/04/2010 Document Revised: 12/31/2016 Document Reviewed: 11/25/2015 Elsevier Interactive Patient Education  2019 [employer]  Hemorrhoids Hemorrhoids are swollen veins that may develop:  In the butt (rectum). These are called internal hemorrhoids.  Around the opening of the butt (anus). These are called external hemorrhoids. Hemorrhoids can cause pain, itching, or bleeding. Most of the time, they do not cause serious problems. They usually get better with diet changes, lifestyle changes, and other home treatments. What are the causes? This condition may be caused by:  Having trouble pooping (constipation).  Pushing hard (straining) to poop.  Watery poop (diarrhea).  Pregnancy.  Being very overweight (obese).  Sitting for long periods of time.  Heavy lifting or other activity that causes you to strain.  Anal sex.  Riding a bike for a long period of time. What are the signs or symptoms? Symptoms of this condition include:  Pain.  Itching or soreness in the butt.  Bleeding from the butt.  Leaking poop.  Swelling in the area.  One or more lumps around the opening of your butt. How is this diagnosed? A doctor can often  diagnose this condition by looking at the affected area. The doctor may also:  Do an exam that involves feeling the area with a gloved hand (digital rectal exam).  Examine the area inside your butt using a small tube (anoscope).  Order blood tests. This may be done if you have lost a lot of blood.  Have you get a test that involves looking inside the colon using a flexible tube with a camera on the end (sigmoidoscopy or colonoscopy). How is this treated? This condition can usually be treated at home. Your doctor may tell you to change what you eat, make lifestyle changes, or try home treatments. If these do not help, procedures can be done to remove the hemorrhoids or make them smaller. These may involve:  Placing rubber bands at the base of the hemorrhoids to cut off their blood supply.  Injecting medicine into the hemorrhoids to shrink them.  Shining a type of light energy onto the hemorrhoids to cause them to fall off.  Doing surgery to remove the hemorrhoids or cut off their blood supply. Follow these instructions at home: Eating and drinking   Eat foods that have a lot of fiber in them. These include whole grains, beans, nuts, fruits, and vegetables.  Ask your doctor about taking products that have added fiber (fibersupplements).  Reduce the amount of fat in your diet. You can do this by: ? Eating low-fat dairy products. ? Eating less red meat. ? Avoiding processed foods.  Drink enough fluid to keep your pee (urine) pale yellow. Managing pain and swelling   Take a warm-water bath (sitz bath) for 20 minutes to ease pain. Do this 3-4 times a day. You may do this in a bathtub or using a portable sitz bath that fits over the toilet.  If told, put ice on the painful area. It may be helpful to use ice between your warm baths. ? Put ice in a plastic bag. ? Place a towel between your skin and the bag. ? Leave the ice on for 20 minutes, 2-3 times a day. General  instructions  Take over-the-counter and prescription medicines only as told by your doctor. ? Medicated creams and medicines may be used as told.  Exercise often. Ask your doctor how much and what kind of exercise is best for you.  Go to the bathroom when you have the urge to poop. Do not wait.  Avoid pushing too hard when you poop.  Keep your butt dry and clean. Use wet toilet paper or moist towelettes after pooping.  Do not sit on the toilet for a long time.  Keep all follow-up visits as told by your doctor. This is important. Contact a doctor if you:  Have pain and swelling that do not get better with treatment or medicine.  Have trouble pooping.  Cannot poop.  Have pain or swelling outside the area of the hemorrhoids. Get help right away if you have:  Bleeding that will not  stop. Summary  Hemorrhoids are swollen veins in the butt or around the opening of the butt.  They can cause pain, itching, or bleeding.  Eat foods that have a lot of fiber in them. These include whole grains, beans, nuts, fruits, and vegetables.  Take a warm-water bath (sitz bath) for 20 minutes to ease pain. Do this 3-4 times a day. This information is not intended to replace advice given to you by your health care provider. Make sure you discuss any questions you have with your health care provider. Document Released: 12/10/2007 Document Revised: 07/22/2017 Document Reviewed: 07/22/2017 Elsevier Interactive Patient Education  2019 Elsevier Inc.  Diverticulitis  Diverticulitis is when small pockets in your large intestine (colon) get infected or swollen. This causes stomach pain and watery poop (diarrhea). These pouches are called diverticula. They form in people who have a condition called diverticulosis. Follow these instructions at home: Medicines  Take over-the-counter and prescription medicines only as told by your doctor. These include: ? Antibiotics. ? Pain medicines. ? Fiber  pills. ? Probiotics. ? Stool softeners.  Do not drive or use heavy machinery while taking prescription pain medicine.  If you were prescribed an antibiotic, take it as told. Do not stop taking it even if you feel better. General instructions   Follow a diet as told by your doctor.  When you feel better, your doctor may tell you to change your diet. You may need to eat a lot of fiber. Fiber makes it easier to poop (have bowel movements). Healthy foods with fiber include: ? Berries. ? Beans. ? Lentils. ? Green vegetables.  Exercise 3 or more times a week. Aim for 30 minutes each time. Exercise enough to sweat and make your heart beat faster.  Keep all follow-up visits as told. This is important. You may need to have an exam of the large intestine. This is called a colonoscopy. Contact a doctor if:  Your pain does not get better.  You have a hard time eating or drinking.  You are not pooping like normal. Get help right away if:  Your pain gets worse.  Your problems do not get better.  Your problems get worse very fast.  You have a fever.  You throw up (vomit) more than one time.  You have poop that is: ? Bloody. ? Black. ? Tarry. Summary  Diverticulitis is when small pockets in your large intestine (colon) get infected or swollen.  Take medicines only as told by your doctor.  Follow a diet as told by your doctor. This information is not intended to replace advice given to you by your health care provider. Make sure you discuss any questions you have with your health care provider. Document Released: 08/19/2007 Document Revised: 03/19/2016 Document Reviewed: 03/19/2016 Elsevier Interactive Patient Education  2019 [employer].  Colon Polyps  Polyps are tissue growths inside the body. Polyps can grow in many places, including the large intestine (colon). A polyp may be a round bump or a mushroom-shaped growth. You could have one polyp or several. Most colon  polyps are noncancerous (benign). However, some colon polyps can become cancerous over time. Finding and removing the polyps early can help prevent this. What are the causes? The exact cause of colon polyps is not known. What increases the risk? You are more likely to develop this condition if you:  Have a family history of colon cancer or colon polyps.  Are older than 59 or older than 45 if you are African  American.  Have inflammatory bowel disease, such as ulcerative colitis or Crohn's disease.  Have certain hereditary conditions, such as: ? Familial adenomatous polyposis. ? Lynch syndrome. ? Turcot syndrome. ? Peutz-Jeghers syndrome.  Are overweight.  Smoke cigarettes.  Do not get enough exercise.  Drink too much alcohol.  Eat a diet that is high in fat and red meat and low in fiber.  Had childhood cancer that was treated with abdominal radiation. What are the signs or symptoms? Most polyps do not cause symptoms. If you have symptoms, they may include:  Blood coming from your rectum when having a bowel movement.  Blood in your stool. The stool may look dark red or black.  Abdominal pain.  A change in bowel habits, such as constipation or diarrhea. How is this diagnosed? This condition is diagnosed with a colonoscopy. This is a procedure in which a lighted, flexible scope is inserted into the anus and then passed into the colon to examine the area. Polyps are sometimes found when a colonoscopy is done as part of routine cancer screening tests. How is this treated? Treatment for this condition involves removing any polyps that are found. Most polyps can be removed during a colonoscopy. Those polyps will then be tested for cancer. Additional treatment may be needed depending on the results of testing. Follow these instructions at home: Lifestyle  Maintain a healthy weight, or lose weight if recommended by your health care provider.  Exercise every day or as told by  your health care provider.  Do not use any products that contain nicotine or tobacco, such as cigarettes and e-cigarettes. If you need help quitting, ask your health care provider.  If you drink alcohol, limit how much you have: ? 0-1 drink a day for women. ? 0-2 drinks a day for men.  Be aware of how much alcohol is in your drink. In the [place] one drink equals one 12 oz bottle of beer (355 mL), one 5 oz glass of wine (148 mL), or one 1 oz shot of hard liquor (44 mL). Eating and drinking   Eat foods that are high in fiber, such as fruits, vegetables, and whole grains.  Eat foods that are high in calcium and vitamin D, such as milk, cheese, yogurt, eggs, liver, fish, and broccoli.  Limit foods that are high in fat, such as fried foods and desserts.  Limit the amount of red meat and processed meat you eat, such as hot dogs, sausage, bacon, and lunch meats. General instructions  Keep all follow-up visits as told by your health care provider. This is important. ? This includes having regularly scheduled colonoscopies. ? Talk to your health care provider about when you need a colonoscopy. Contact a health care provider if:  You have new or worsening bleeding during a bowel movement.  You have new or increased blood in your stool.  You have a change in bowel habits.  You lose weight for no known reason. Summary  Polyps are tissue growths inside the body. Polyps can grow in many places, including the colon.  Most colon polyps are noncancerous (benign), but some can become cancerous over time.  This condition is diagnosed with a colonoscopy.  Treatment for this condition involves removing any polyps that are found. Most polyps can be removed during a colonoscopy. This information is not intended to replace advice given to you by your health care provider. Make sure you discuss any questions you have with your health care provider. Document Released:  11/27/2003 Document Revised:  06/17/2017 Document Reviewed: 06/17/2017 Elsevier Interactive Patient Education  [employer].

## 2018-05-05 ENCOUNTER — Encounter (HOSPITAL_COMMUNITY): Payer: Self-pay | Admitting: Internal Medicine

## 2018-05-20 ENCOUNTER — Ambulatory Visit: Payer: Medicare Other | Admitting: Internal Medicine

## 2018-05-20 ENCOUNTER — Encounter: Payer: Self-pay | Admitting: Internal Medicine

## 2018-05-20 VITALS — BP 161/89 | HR 64 | Ht 63.0 in | Wt 201.2 lb

## 2018-05-20 DIAGNOSIS — I428 Other cardiomyopathies: Secondary | ICD-10-CM | POA: Diagnosis not present

## 2018-05-20 DIAGNOSIS — I10 Essential (primary) hypertension: Secondary | ICD-10-CM | POA: Diagnosis not present

## 2018-05-20 DIAGNOSIS — I480 Paroxysmal atrial fibrillation: Secondary | ICD-10-CM

## 2018-05-20 MED ORDER — FUROSEMIDE 40 MG PO TABS: 40.0000 mg | ORAL_TABLET | Freq: Every day | ORAL | 2 refills | Status: DC | PRN

## 2018-05-20 MED ORDER — LOSARTAN POTASSIUM 100 MG PO TABS: 100.0000 mg | ORAL_TABLET | Freq: Every day | ORAL | 6 refills | Status: DC

## 2018-05-20 MED ORDER — HYDROCHLOROTHIAZIDE 12.5 MG PO TABS: 12.5000 mg | ORAL_TABLET | Freq: Every day | ORAL | 2 refills | Status: DC

## 2018-05-20 NOTE — Progress Notes (Signed)
PCP: [name], MD Primary Cardiologist: Dr Domenic Polite Primary EP: Dr [name]  [name] Fry is a 73 y.o. female who presents today for routine electrophysiology followup.  Since last being seen in our clinic, the patient reports doing very well.  Today, she denies symptoms of palpitations, chest pain, shortness of breath,  lower extremity edema, dizziness, presyncope, or syncope.  The patient is otherwise without complaint today.   Past Medical History:  Diagnosis Date  . Arthritis   . Atrial fibrillation [facility])    Documented January 2017  . Breast cancer ([place])    Left  . Cataracts, bilateral   . Coronary atherosclerosis of native coronary artery    DES OM 3/12  . Essential hypertension, benign   . GERD (gastroesophageal reflux disease)   . H/O hiatal hernia   . Hyperlipidemia   . Hypothyroidism   . Sleep apnea    Past Surgical History:  Procedure Laterality Date  . [place] VITRECTOMY WITH 20 GAUGE MVR PORT Right 09/29/2012   Procedure: 25 GAUGE PARS PLANA VITRECTOMY WITH 20 GAUGE MVR PORT;  Surgeon: Hayden Pedro, MD;  Location: [place];  Service: Ophthalmology;  Laterality: Right;  . APPENDECTOMY    . CARDIAC CATHETERIZATION N/A 07/26/2015   Procedure: Left Heart Cath and Coronary Angiography;  Surgeon: [name], MD;  Location: [place] [facility];  Service: Cardiovascular;  Laterality: N/A;  . CARDIOVERSION N/A 08/30/2015   Procedure: CARDIOVERSION;  Surgeon: Satira Sark, MD;  [facility];  Service: Cardiovascular;  Laterality: N/A;  . CATARACT EXTRACTION W/PHACO Right 05/18/2013   Procedure: CATARACT EXTRACTION PHACO AND INTRAOCULAR LENS PLACEMENT RIGHT EYE;  Surgeon: Tonny Branch, MD;  [facility]: [facility];  Service: Ophthalmology;  Laterality: Right;  CDE:13.35  . CATARACT EXTRACTION W/PHACO Left 02/10/2016   Procedure: CATARACT EXTRACTION PHACO AND INTRAOCULAR LENS PLACEMENT LEFT EYE: CDE:  5.39;  Surgeon: Tonny Branch, MD;  [facility];   Service: Ophthalmology;  Laterality: Left;  . CHOLECYSTECTOMY    . COLONOSCOPY N/A 05/02/2018   Procedure: COLONOSCOPY;  Surgeon: Rogene Houston, MD;  [facility];  Service: Endoscopy;  Laterality: N/A;  1:15  . ELECTROPHYSIOLOGIC STUDY N/A 11/19/2015   Procedure: Atrial Fibrillation Ablation;  Surgeon: Thompson Grayer, MD;  Location: [place] [facility];  Service: Cardiovascular;  Laterality: N/A;  . GAS/FLUID EXCHANGE Right 09/29/2012   Procedure: GAS/FLUID EXCHANGE;  Surgeon: Hayden Pedro, MD;  Location: [place];  Service: Ophthalmology;  Laterality: Right;  . LASER PHOTO ABLATION Right 09/29/2012   Procedure: LASER PHOTO ABLATION;  Surgeon: Hayden Pedro, MD;  Location: [place];  Service: Ophthalmology;  Laterality: Right;  Headscope laser  . MASTECTOMY Left    1997; S/P chemotherapy/radiation  . MEMBRANE PEEL Right 09/29/2012   Procedure: MEMBRANE PEEL;  Surgeon: Hayden Pedro, MD;  Location: [place];  Service: Ophthalmology;  Laterality: Right;  . POLYPECTOMY  05/02/2018   Procedure: POLYPECTOMY;  Surgeon: Rogene Houston, MD;  [facility];  Service: Endoscopy;;  sigmoid colon times two, transverse colon times one  . TEE WITHOUT CARDIOVERSION N/A 11/19/2015   Procedure: TRANSESOPHAGEAL ECHOCARDIOGRAM (TEE);  Surgeon: Thayer Headings, MD;  Location: [place];  Service: Cardiovascular;  Laterality: N/A;  . TUBAL LIGATION      ROS- all systems are reviewed and negatives except as per HPI above  Current Outpatient Medications  Medication Sig Dispense Refill  . acetaminophen (TYLENOL) 500 MG tablet Take  500-1,000 mg by mouth every 6 (six) hours as needed for moderate pain or headache.    . ezetimibe (ZETIA) 10 MG tablet Take 10 mg by mouth daily.  2  . furosemide (LASIX) 40 MG tablet Take 1 tablet (40 mg total) by mouth daily as needed for fluid (or weight gain). 30 tablet 2  . GARLIC OIL PO Take 1 capsule by mouth as needed.     . hydrochlorothiazide (HYDRODIURIL)  12.5 MG tablet Take 12.5 mg by mouth daily.    Marland Kitchen levothyroxine (SYNTHROID, LEVOTHROID) 125 MCG tablet Take 125 mcg by mouth daily before breakfast.   3  . losartan (COZAAR) 50 MG tablet Take 50 mg by mouth daily.    . metoprolol succinate (TOPROL-XL) 25 MG 24 hr tablet TAKE ONE TABLET BY MOUTH EVERY DAY (Patient taking differently: Take 25 mg by mouth daily. ) 90 tablet 3  . Multiple Vitamins-Minerals (OCUVITE PRESERVISION PO) Take 1 tablet by mouth 2 (two) times daily.     Marland Kitchen NITROSTAT 0.4 MG SL tablet PLACE 1 TABLET UNDER THE TONGUE EVERY 5 MINUTES FOR 3 DOSES AS NEEDED CHEST PAIN (Patient taking differently: Place 0.4 mg under the tongue every 5 (five) minutes as needed for chest pain. ) 25 tablet 3  . rivaroxaban (XARELTO) 20 MG TABS tablet Take 1 tablet (20 mg total) by mouth daily with supper. TAKE ONE TABLET BY MOUTH EVERY DAY with SUPPER (pt. needs APPOINTMENT. FOR further refills)    . Specialty Vitamins Products (ECHINACEA C COMPLETE PO) Take 760 mg by mouth daily as needed (cold symptoms).      No current facility-administered medications for this visit.     Physical Exam: Vitals:   05/20/18 1026  BP: (!) 161/89  Pulse: 64  SpO2: 98%  Weight: 201 lb 3.2 oz (91.3 kg)  Height: 5\' 3"  (1.6 m)    GEN- The patient is well appearing, alert and oriented x 3 today.   Head- normocephalic, atraumatic Eyes-  Sclera clear, conjunctiva pink Ears- hearing intact Oropharynx- clear Lungs- Clear to ausculation bilaterally, normal work of breathing Heart- Regular rate and rhythm, no murmurs, rubs or gallops, PMI not laterally displaced GI- soft, NT, ND, + BS Extremities- no clubbing, cyanosis, or edema  Wt Readings from Last 3 Encounters:  05/20/18 201 lb 3.2 oz (91.3 kg)  05/02/18 202 lb 3.2 oz (91.7 kg)  03/31/18 202 lb 3.2 oz (91.7 kg)    EKG tracing ordered today is personally reviewed and shows sinus rhythm with LBBB  Assessment and Plan:  1. Persistent afib Doing very well  post ablation off AAD therapy chads2vasc score is 5.  On xarelto  2. Nonischemic CM Tachy mediated EF has recovered with sinus  3. HTN Elevated today Increase losartan to 100mg  daily  4. CAD No ischemic symptoms  5. Overweight Body mass index is 35.64 kg/m. Lifestyle modification again discussed today  Return in a year Follow-up with Dr Domenic Polite as scheduled  Thompson Grayer MD, [facility] 05/20/2018 10:51 AM

## 2018-05-20 NOTE — Patient Instructions (Addendum)
Medication Instructions:   Increase Losartan to 100mg  daily.   Continue all other medications.     Follow-Up: 1 year - Dr. [name]   Any Other Special Instructions Will Be Listed Below (If Applicable). Atrial Fibrillation clinic phone number:  [phone]  If you need a refill on your cardiac medications before your next appointment, please call your pharmacy.

## 2018-07-10 ENCOUNTER — Other Ambulatory Visit: Payer: Self-pay | Admitting: Internal Medicine

## 2018-07-15 DIAGNOSIS — C50912 Malignant neoplasm of unspecified site of left female breast: Secondary | ICD-10-CM | POA: Diagnosis not present

## 2018-08-11 ENCOUNTER — Telehealth: Payer: Self-pay | Admitting: *Deleted

## 2018-08-11 NOTE — Telephone Encounter (Signed)
Patient verbally consented for telehealth visits with [facility] and understands that her insurance company will be billed for the encounter.   Aware to have weight available. Unable to check BP & HR 

## 2018-08-14 NOTE — Progress Notes (Signed)
Virtual Visit via Video Note   This visit type was conducted due to national recommendations for restrictions regarding the COVID-19 Pandemic (e.g. social distancing) in an effort to limit this patient's exposure and mitigate transmission in our community.  Due to her co-morbid illnesses, this patient is at least at moderate risk for complications without adequate follow up.  This format is felt to be most appropriate for this patient at this time.  All issues noted in this document were discussed and addressed.  A limited physical exam was performed with this format.  Please refer to the patient's chart for her consent to telehealth for [facility].   Date:  08/15/2018   ID:  [name] Fry, DOB 1945/06/23, MRN [id number]  Patient Location: Home Provider Location: Home  PCP:  [name], MD  Cardiologist:  Rozann Lesches, MD Electrophysiologist:  Thompson Grayer, MD   Evaluation Performed:  Follow-Up Visit  Chief Complaint:   Cardiac follow-up  History of Present Illness:    [name] Fry is a 73 y.o. female last seen in November 2019.  We communicated via video conferencing today.  She states that she has had no angina symptoms or palpitations.  She has been staying around the house, but has enjoyed going outside to work in her garden and plant flowers.  She reports NYHA class II dyspnea.  She sees Dr. [name] with a history of atrial fibrillation ablation. She had a visit in March.  She states that she will be seeing her provider at [facility] in July, to get lab work at that time.  She does not report any bleeding problems on Xarelto.  She tolerated the increase in Cozaar made by Dr. [name] at the last visit.  She underwent a colonoscopy in February with Dr. Laural Golden.  The patient does not have symptoms concerning for COVID-19 infection (fever, chills, cough, or new shortness of breath).    Past Medical History:  Diagnosis Date  . Arthritis   . Atrial fibrillation [facility])    Documented January 2017  . Breast cancer ([place])    Left  . Cataracts, bilateral   . Coronary atherosclerosis of native coronary artery    DES OM 3/12  . Essential hypertension, benign   . GERD (gastroesophageal reflux disease)   . H/O hiatal hernia   . Hyperlipidemia   . Hypothyroidism   . Sleep apnea    Past Surgical History:  Procedure Laterality Date  . [place] VITRECTOMY WITH 20 GAUGE MVR PORT Right 09/29/2012   Procedure: 25 GAUGE PARS PLANA VITRECTOMY WITH 20 GAUGE MVR PORT;  Surgeon: Hayden Pedro, MD;  Location: [place];  Service: Ophthalmology;  Laterality: Right;  . APPENDECTOMY    . CARDIAC CATHETERIZATION N/A 07/26/2015   Procedure: Left Heart Cath and Coronary Angiography;  Surgeon: [name], MD;  Location: [place] CV LAB;  Service: Cardiovascular;  Laterality: N/A;  . CARDIOVERSION N/A 08/30/2015   Procedure: CARDIOVERSION;  Surgeon: Satira Sark, MD;  [facility];  Service: Cardiovascular;  Laterality: N/A;  . CATARACT EXTRACTION W/PHACO Right 05/18/2013   Procedure: CATARACT EXTRACTION PHACO AND INTRAOCULAR LENS PLACEMENT RIGHT EYE;  Surgeon: Tonny Branch, MD;  [facility]: [facility];  Service: Ophthalmology;  Laterality: Right;  CDE:13.35  . CATARACT EXTRACTION W/PHACO Left 02/10/2016   Procedure: CATARACT EXTRACTION PHACO AND INTRAOCULAR LENS PLACEMENT LEFT EYE: CDE:  5.39;  Surgeon: Tonny Branch, MD;  [facility]: [facility];  Service: Ophthalmology;  Laterality: Left;  .  CHOLECYSTECTOMY    . COLONOSCOPY N/A 05/02/2018   Procedure: COLONOSCOPY;  Surgeon: Rogene Houston, MD;  [facility];  Service: Endoscopy;  Laterality: N/A;  1:15  . ELECTROPHYSIOLOGIC STUDY N/A 11/19/2015   Procedure: Atrial Fibrillation Ablation;  Surgeon: Thompson Grayer, MD;  Location: [place] CV LAB;  Service: Cardiovascular;  Laterality: N/A;  . GAS/FLUID EXCHANGE Right 09/29/2012   Procedure: GAS/FLUID EXCHANGE;  Surgeon: Hayden Pedro, MD;  Location: [place];   Service: Ophthalmology;  Laterality: Right;  . LASER PHOTO ABLATION Right 09/29/2012   Procedure: LASER PHOTO ABLATION;  Surgeon: Hayden Pedro, MD;  Location: [place];  Service: Ophthalmology;  Laterality: Right;  Headscope laser  . MASTECTOMY Left    1997; S/P chemotherapy/radiation  . MEMBRANE PEEL Right 09/29/2012   Procedure: MEMBRANE PEEL;  Surgeon: Hayden Pedro, MD;  Location: [place];  Service: Ophthalmology;  Laterality: Right;  . POLYPECTOMY  05/02/2018   Procedure: POLYPECTOMY;  Surgeon: Rogene Houston, MD;  [facility];  Service: Endoscopy;;  sigmoid colon times two, transverse colon times one  . TEE WITHOUT CARDIOVERSION N/A 11/19/2015   Procedure: TRANSESOPHAGEAL ECHOCARDIOGRAM (TEE);  Surgeon: Thayer Headings, MD;  Location: [place];  Service: Cardiovascular;  Laterality: N/A;  . TUBAL LIGATION       Current Meds  Medication Sig  . acetaminophen (TYLENOL) 500 MG tablet Take 500-1,000 mg by mouth every 6 (six) hours as needed for moderate pain or headache.  . cyclobenzaprine (FLEXERIL) 5 MG tablet Take 5 mg by mouth at bedtime.  Marland Kitchen ezetimibe (ZETIA) 10 MG tablet Take 10 mg by mouth daily.  . furosemide (LASIX) 40 MG tablet Take 1 tablet (40 mg total) by mouth daily as needed for fluid (or weight gain).  Marland Kitchen GARLIC OIL PO Take 1 capsule by mouth as needed.   . hydrochlorothiazide (HYDRODIURIL) 12.5 MG tablet Take 1 tablet (12.5 mg total) by mouth daily.  Marland Kitchen levothyroxine (SYNTHROID, LEVOTHROID) 125 MCG tablet Take 125 mcg by mouth daily before breakfast.   . losartan (COZAAR) 100 MG tablet Take 1 tablet (100 mg total) by mouth daily.  . metoprolol succinate (TOPROL-XL) 25 MG 24 hr tablet TAKE 1 TABLET BY MOUTH EVERY DAY  . Multiple Vitamins-Minerals (OCUVITE PRESERVISION PO) Take 1 tablet by mouth 2 (two) times daily.   Marland Kitchen NITROSTAT 0.4 MG SL tablet PLACE 1 TABLET UNDER THE TONGUE EVERY 5 MINUTES FOR 3 DOSES AS NEEDED CHEST PAIN (Patient taking differently: Place 0.4  mg under the tongue every 5 (five) minutes as needed for chest pain. )  . rivaroxaban (XARELTO) 20 MG TABS tablet Take 1 tablet (20 mg total) by mouth daily with supper. TAKE ONE TABLET BY MOUTH EVERY DAY with SUPPER (pt. needs APPOINTMENT. FOR further refills)  . Specialty Vitamins Products (ECHINACEA C COMPLETE PO) Take 760 mg by mouth daily as needed (cold symptoms).      Allergies:   Doxycycline   Social History   Tobacco Use  . Smoking status: Never Smoker  . Smokeless tobacco: Never Used  Substance Use Topics  . Alcohol use: No    Alcohol/week: 0.0 standard drinks  . Drug use: No     Family Hx: The patient's family history includes Heart attack (age of onset: 33) in her father.  ROS:   Please see the history of present illness.    All other systems reviewed and are negative.   Prior CV studies:   The following studies were  reviewed today:  Echocardiogram 02/04/2016: Study Conclusions  - Left ventricle: The cavity size was normal. Wall thickness was increased in a pattern of mild LVH. Systolic function was normal. The estimated ejection fraction was in the range of 55% to 60%. Diastolic function is abnormal, indeterminate grade. There is evidence of elevated LA pressure. Wall motion was normal; there were no regional wall motion abnormalities. - Aortic valve: Mildly calcified annulus. Trileaflet; normal thickness leaflets. Valve area (VTI): 1.88 cm^2. Valve area (Vmax): 1.78 cm^2. Valve area (Vmean): 1.7 cm^2. - Mitral valve: Mildly calcified annulus. Mildly thickened leaflets . There was mild regurgitation. - Technically adequate study.  Labs/Other Tests and Data Reviewed:    EKG:  An ECG dated 05/20/2018 was personally reviewed today and demonstrated:  Sinus rhythm with LBBB.  Recent Labs:  September 2019: Cholesterol 290, TG 495, HDL 51, LDL not calculated, BUN 15, creatinine 0.76, potassium 4.1, AST 14, ALT 14, Hgb 11.8, platelets 276  Wt  Readings from Last 3 Encounters:  08/15/18 195 lb 3.2 oz (88.5 kg)  05/20/18 201 lb 3.2 oz (91.3 kg)  05/02/18 202 lb 3.2 oz (91.7 kg)     Objective:    Vital Signs:  Ht 5\' 4"  (1.626 m)   Wt 195 lb 3.2 oz (88.5 kg)   LMP 09/28/2012   BMI 33.51 kg/m    She did not have a way to check her blood pressure today. General: Patient appears comfortable at rest. HEENT: Conjunctiva and lids normal. Lungs: Patient spoke in full sentences, not short of breath.  No audible wheezing or coughing. Skin: Normal appearance of color and turgor. Neuropsychiatric: Gaze conjugate.  Speech pattern normal.  Moves all extremities.  Affect appropriate.  ASSESSMENT & PLAN:    1.  CAD status post DES to the obtuse marginal in 2012.  Stent site was patent as of 2017 and she reports no active angina at this time.  Currently not on aspirin given concurrent use of Xarelto.  2.  Persistent atrial fibrillation status post successful atrial fibrillation ablation.  She continues to follow with Dr. [name].  No change in Toprol-XL or Xarelto.  She will get follow-up CBC and BMET with [facility] in July.  3.  Nonischemic cardiomyopathy with normalization of LVEF, 55 to 60% by last assessment.  4.  Essential hypertension, Cozaar increased at last office visit with Dr. [name].  Keep follow-up with PCP in the interim.  COVID-19 Education: The signs and symptoms of COVID-19 were discussed with the patient and how to seek care for testing (follow up with PCP or arrange E-visit).  The importance of social distancing was discussed today.  Time:   Today, I have spent 8 minutes with the patient with telehealth technology discussing the above problems.     Medication Adjustments/Labs and Tests Ordered: Current medicines are reviewed at length with the patient today.  Concerns regarding medicines are outlined above.   Tests Ordered: Orders Placed This Encounter  Procedures  . Basic metabolic panel  . CBC    Medication  Changes: No orders of the defined types were placed in this encounter.   Disposition:  Follow up 6 months in the [place] office.  Signed, Rozann Lesches, MD  08/15/2018 1:18 PM    [place]

## 2018-08-15 ENCOUNTER — Telehealth (INDEPENDENT_AMBULATORY_CARE_PROVIDER_SITE_OTHER): Payer: Medicare Other | Admitting: Cardiology

## 2018-08-15 ENCOUNTER — Encounter: Payer: Self-pay | Admitting: Cardiology

## 2018-08-15 VITALS — Ht 64.0 in | Wt 195.2 lb

## 2018-08-15 DIAGNOSIS — I2510 Atherosclerotic heart disease of native coronary artery without angina pectoris: Secondary | ICD-10-CM | POA: Diagnosis not present

## 2018-08-15 DIAGNOSIS — Z7189 Other specified counseling: Secondary | ICD-10-CM

## 2018-08-15 DIAGNOSIS — I4819 Other persistent atrial fibrillation: Secondary | ICD-10-CM | POA: Diagnosis not present

## 2018-08-15 DIAGNOSIS — I428 Other cardiomyopathies: Secondary | ICD-10-CM | POA: Diagnosis not present

## 2018-08-15 DIAGNOSIS — I10 Essential (primary) hypertension: Secondary | ICD-10-CM

## 2018-08-15 NOTE — Patient Instructions (Addendum)
Medication Instructions:   Your physician recommends that you continue on your current medications as directed. Please refer to the Current Medication list given to you today.  Labwork:  Your physician recommends that you return for lab work in July with your family doctor to check your BMET & CBC. Please have this sent to our office.  Testing/Procedures:  NONE  Follow-Up:  Your physician recommends that you schedule a follow-up appointment in: 6 months. You will receive a reminder letter in the mail in about 4 months reminding you to call and schedule your appointment. If you don't receive this letter, please contact our office.  Any Other Special Instructions Will Be Listed Below (If Applicable).  If you need a refill on your cardiac medications before your next appointment, please call your pharmacy.

## 2018-09-20 DIAGNOSIS — I10 Essential (primary) hypertension: Secondary | ICD-10-CM | POA: Diagnosis not present

## 2018-09-20 DIAGNOSIS — E039 Hypothyroidism, unspecified: Secondary | ICD-10-CM | POA: Diagnosis not present

## 2018-09-20 DIAGNOSIS — E7800 Pure hypercholesterolemia, unspecified: Secondary | ICD-10-CM | POA: Diagnosis not present

## 2018-10-05 ENCOUNTER — Other Ambulatory Visit: Payer: Self-pay | Admitting: Interventional Cardiology

## 2018-10-17 ENCOUNTER — Telehealth: Payer: Self-pay | Admitting: Cardiology

## 2018-10-17 MED ORDER — RIVAROXABAN 20 MG PO TABS: 20.0000 mg | ORAL_TABLET | Freq: Every day | ORAL | 0 refills | Status: DC

## 2018-10-17 NOTE — Telephone Encounter (Signed)
Patient calling the office for samples of medication:   1.  What medication and dosage are you requesting samples for?    REQUESTING SAMPLES (XARELTO) 20 MG TABS   2.  Are you currently out of this medication? NO

## 2018-10-17 NOTE — Telephone Encounter (Signed)
Pt aware to contact office when she is here and we will bring meds out to her

## 2018-10-18 ENCOUNTER — Encounter: Payer: Self-pay | Admitting: *Deleted

## 2018-11-15 ENCOUNTER — Telehealth: Payer: Self-pay | Admitting: Cardiology

## 2018-11-15 NOTE — Telephone Encounter (Signed)
Patient calling the office for samples of medication:   1.  What medication and dosage are you requesting samples for?   ZELERTO   2.  Are you currently out of this medication? No

## 2018-11-16 MED ORDER — RIVAROXABAN 20 MG PO TABS: 20.0000 mg | ORAL_TABLET | Freq: Every day | ORAL | 0 refills | Status: DC

## 2018-12-14 ENCOUNTER — Other Ambulatory Visit: Payer: Self-pay | Admitting: Cardiology

## 2018-12-14 MED ORDER — RIVAROXABAN 20 MG PO TABS: 20.0000 mg | ORAL_TABLET | Freq: Every day | ORAL | 0 refills | Status: DC

## 2018-12-14 NOTE — Telephone Encounter (Signed)
Patient is out of Xarelto samples and she would like some more.

## 2018-12-14 NOTE — Telephone Encounter (Signed)
Patient aware that samples are available for pick up.

## 2018-12-15 DIAGNOSIS — I10 Essential (primary) hypertension: Secondary | ICD-10-CM | POA: Diagnosis not present

## 2018-12-15 DIAGNOSIS — D519 Vitamin B12 deficiency anemia, unspecified: Secondary | ICD-10-CM | POA: Diagnosis not present

## 2018-12-15 DIAGNOSIS — E039 Hypothyroidism, unspecified: Secondary | ICD-10-CM | POA: Diagnosis not present

## 2018-12-15 DIAGNOSIS — E559 Vitamin D deficiency, unspecified: Secondary | ICD-10-CM | POA: Diagnosis not present

## 2018-12-15 DIAGNOSIS — E7800 Pure hypercholesterolemia, unspecified: Secondary | ICD-10-CM | POA: Diagnosis not present

## 2018-12-20 DIAGNOSIS — Z0000 Encounter for general adult medical examination without abnormal findings: Secondary | ICD-10-CM | POA: Diagnosis not present

## 2018-12-20 DIAGNOSIS — E039 Hypothyroidism, unspecified: Secondary | ICD-10-CM | POA: Diagnosis not present

## 2018-12-20 DIAGNOSIS — E7800 Pure hypercholesterolemia, unspecified: Secondary | ICD-10-CM | POA: Diagnosis not present

## 2018-12-20 DIAGNOSIS — Z23 Encounter for immunization: Secondary | ICD-10-CM | POA: Diagnosis not present

## 2018-12-20 DIAGNOSIS — I10 Essential (primary) hypertension: Secondary | ICD-10-CM | POA: Diagnosis not present

## 2019-02-08 ENCOUNTER — Telehealth: Payer: Self-pay

## 2019-02-08 MED ORDER — RIVAROXABAN 20 MG PO TABS: 20.0000 mg | ORAL_TABLET | Freq: Every day | ORAL | 0 refills | Status: DC

## 2019-02-08 NOTE — Telephone Encounter (Signed)
Patient calling the office for samples of medication:  1.  What medication and dosage are you requesting samples for?  rivaroxaban (XARELTO) 20 MG    2.  Are you currently out of this medication?     YES  Patient states that she needs 30 pills to finish out the year if we can supply

## 2019-02-08 NOTE — Telephone Encounter (Signed)
Pt aware to call when she get to office and we would bring samples out to her

## 2019-02-08 NOTE — Telephone Encounter (Signed)
Please call [phone]

## 2019-02-13 DIAGNOSIS — Z9012 Acquired absence of left breast and nipple: Secondary | ICD-10-CM | POA: Diagnosis not present

## 2019-02-13 DIAGNOSIS — Z1231 Encounter for screening mammogram for malignant neoplasm of breast: Secondary | ICD-10-CM | POA: Diagnosis not present

## 2019-02-17 ENCOUNTER — Encounter: Payer: Self-pay | Admitting: Cardiology

## 2019-02-17 ENCOUNTER — Ambulatory Visit (INDEPENDENT_AMBULATORY_CARE_PROVIDER_SITE_OTHER): Payer: Medicare Other | Admitting: Cardiology

## 2019-02-17 ENCOUNTER — Encounter: Payer: Self-pay | Admitting: *Deleted

## 2019-02-17 ENCOUNTER — Other Ambulatory Visit: Payer: Self-pay

## 2019-02-17 VITALS — BP 162/84 | HR 70 | Ht 64.0 in | Wt 195.6 lb

## 2019-02-17 DIAGNOSIS — Z8679 Personal history of other diseases of the circulatory system: Secondary | ICD-10-CM

## 2019-02-17 DIAGNOSIS — I2510 Atherosclerotic heart disease of native coronary artery without angina pectoris: Secondary | ICD-10-CM | POA: Diagnosis not present

## 2019-02-17 DIAGNOSIS — I4819 Other persistent atrial fibrillation: Secondary | ICD-10-CM

## 2019-02-17 DIAGNOSIS — I10 Essential (primary) hypertension: Secondary | ICD-10-CM

## 2019-02-17 MED ORDER — RIVAROXABAN 20 MG PO TABS: 20.0000 mg | ORAL_TABLET | Freq: Every day | ORAL | 0 refills | Status: DC

## 2019-02-17 NOTE — Progress Notes (Signed)
Cardiology Office Note  Date: 02/17/2019   ID: [name], [name] Feb 04, 1946, MRN [id number]  PCP:  [name], MD  Cardiologist:  Rozann Lesches, MD Electrophysiologist:  Thompson Grayer, MD   Chief Complaint  Patient presents with  . Cardiac follow-up    History of Present Illness: [name] Fry is a 73 y.o. female last seen in June.  She is here for a follow-up visit.  Since last evaluation she does not report any major change in health.  She states that she has been social distancing, wears a mask when she goes out.  She does not report any obvious angina symptoms or palpitations.  She reports chronic back pain.  She follows with Dr. [name], history of atrial fibrillation ablation.  I reviewed her lab work from July as outlined below.  She states that she had recent lab work through [facility] as well which we will request.  We went over her medications which are outlined below.  She does not report any obvious bleeding problems or changes in stools on Xarelto.  Past Medical History:  Diagnosis Date  . Arthritis   . Atrial fibrillation [facility])    Documented January 2017  . Breast cancer ([place])    Left  . Cataracts, bilateral   . Coronary atherosclerosis of native coronary artery    DES OM 3/12  . Essential hypertension   . GERD (gastroesophageal reflux disease)   . H/O hiatal hernia   . Hyperlipidemia   . Hypothyroidism   . Sleep apnea     Past Surgical History:  Procedure Laterality Date  . [place] VITRECTOMY WITH 20 GAUGE MVR PORT Right 09/29/2012   Procedure: 25 GAUGE PARS PLANA VITRECTOMY WITH 20 GAUGE MVR PORT;  Surgeon: Hayden Pedro, MD;  Location: [place];  Service: Ophthalmology;  Laterality: Right;  . APPENDECTOMY    . CARDIAC CATHETERIZATION N/A 07/26/2015   Procedure: Left Heart Cath and Coronary Angiography;  Surgeon: [name], MD;  Location: [place] CV LAB;  Service: Cardiovascular;  Laterality: N/A;  . CARDIOVERSION N/A 08/30/2015   Procedure: CARDIOVERSION;  Surgeon: Satira Sark, MD;  [facility];  Service: Cardiovascular;  Laterality: N/A;  . CATARACT EXTRACTION W/PHACO Right 05/18/2013   Procedure: CATARACT EXTRACTION PHACO AND INTRAOCULAR LENS PLACEMENT RIGHT EYE;  Surgeon: Tonny Branch, MD;  [facility];  Service: Ophthalmology;  Laterality: Right;  CDE:13.35  . CATARACT EXTRACTION W/PHACO Left 02/10/2016   Procedure: CATARACT EXTRACTION PHACO AND INTRAOCULAR LENS PLACEMENT LEFT EYE: CDE:  5.39;  Surgeon: Tonny Branch, MD;  [facility];  Service: Ophthalmology;  Laterality: Left;  . CHOLECYSTECTOMY    . COLONOSCOPY N/A 05/02/2018   Procedure: COLONOSCOPY;  Surgeon: Rogene Houston, MD;  [facility];  Service: Endoscopy;  Laterality: N/A;  1:15  . ELECTROPHYSIOLOGIC STUDY N/A 11/19/2015   Procedure: Atrial Fibrillation Ablation;  Surgeon: Thompson Grayer, MD;  Location: [place] CV LAB;  Service: Cardiovascular;  Laterality: N/A;  . GAS/FLUID EXCHANGE Right 09/29/2012   Procedure: GAS/FLUID EXCHANGE;  Surgeon: Hayden Pedro, MD;  Location: [place];  Service: Ophthalmology;  Laterality: Right;  . LASER PHOTO ABLATION Right 09/29/2012   Procedure: LASER PHOTO ABLATION;  Surgeon: Hayden Pedro, MD;  Location: [place];  Service: Ophthalmology;  Laterality: Right;  Headscope laser  . MASTECTOMY Left    1997; S/P chemotherapy/radiation  . MEMBRANE PEEL Right 09/29/2012   Procedure: MEMBRANE PEEL;  Surgeon: Chrystie Nose  Zigmund Daniel, MD;  Location: [place];  Service: Ophthalmology;  Laterality: Right;  . POLYPECTOMY  05/02/2018   Procedure: POLYPECTOMY;  Surgeon: Rogene Houston, MD;  [facility];  Service: Endoscopy;;  sigmoid colon times two, transverse colon times one  . TEE WITHOUT CARDIOVERSION N/A 11/19/2015   Procedure: TRANSESOPHAGEAL ECHOCARDIOGRAM (TEE);  Surgeon: Thayer Headings, MD;  Location: [place];  Service: Cardiovascular;  Laterality: N/A;  . TUBAL LIGATION      Current  Outpatient Medications  Medication Sig Dispense Refill  . acetaminophen (TYLENOL) 500 MG tablet Take 500-1,000 mg by mouth every 6 (six) hours as needed for moderate pain or headache.    . cyclobenzaprine (FLEXERIL) 5 MG tablet Take 5 mg by mouth at bedtime.    Marland Kitchen ezetimibe (ZETIA) 10 MG tablet Take 10 mg by mouth daily.  2  . furosemide (LASIX) 40 MG tablet Take 1 tablet (40 mg total) by mouth daily as needed for fluid (or weight gain). 30 tablet 2  . GARLIC OIL PO Take 1 capsule by mouth as needed.     . hydrochlorothiazide (HYDRODIURIL) 12.5 MG tablet Take 1 tablet (12.5 mg total) by mouth daily. 90 tablet 2  . levothyroxine (SYNTHROID, LEVOTHROID) 125 MCG tablet Take 125 mcg by mouth daily before breakfast.   3  . losartan (COZAAR) 100 MG tablet Take 1 tablet (100 mg total) by mouth daily. 30 tablet 6  . metoprolol succinate (TOPROL-XL) 25 MG 24 hr tablet TAKE 1 TABLET BY MOUTH EVERY DAY 90 tablet 1  . Multiple Vitamins-Minerals (OCUVITE PRESERVISION PO) Take 1 tablet by mouth 2 (two) times daily.     Marland Kitchen NITROSTAT 0.4 MG SL tablet PLACE 1 TABLET UNDER THE TONGUE EVERY 5 MINUTES FOR 3 DOSES AS NEEDED CHEST PAIN (Patient taking differently: Place 0.4 mg under the tongue every 5 (five) minutes as needed for chest pain. ) 25 tablet 3  . rivaroxaban (XARELTO) 20 MG TABS tablet Take 1 tablet (20 mg total) by mouth daily with supper. TAKE ONE TABLET BY MOUTH EVERY DAY with SUPPER 21 tablet 0   No current facility-administered medications for this visit.    Allergies:  Doxycycline   Social History: The patient  reports that she has never smoked. She has never used smokeless tobacco. She reports that she does not drink alcohol or use drugs.   ROS:  Please see the history of present illness. Otherwise, complete review of systems is positive for none.  All other systems are reviewed and negative.   Physical Exam: VS:  BP (!) 162/84   Pulse 70   Ht 5\' 4"  (1.626 m)   Wt 195 lb 9.6 oz (88.7 kg)   LMP  09/28/2012   SpO2 100% Comment: on room air  BMI 33.57 kg/m , BMI Body mass index is 33.57 kg/m.  Wt Readings from Last 3 Encounters:  02/17/19 195 lb 9.6 oz (88.7 kg)  08/15/18 195 lb 3.2 oz (88.5 kg)  05/20/18 201 lb 3.2 oz (91.3 kg)    General: Patient appears comfortable at rest. HEENT: Conjunctiva and lids normal, wearing a mask. Neck: Supple, no elevated JVP or carotid bruits, no thyromegaly. Lungs: Clear to auscultation, nonlabored breathing at rest. Cardiac: Regular rate and rhythm, no S3 or significant systolic murmur. Abdomen: Soft, nontender, bowel sounds present. Extremities: Trace ankle edema, distal pulses 2+. Skin: Warm and dry. Musculoskeletal: No kyphosis. Neuropsychiatric: Alert and oriented x3, affect grossly appropriate.  ECG:  An ECG dated 05/20/2018 was  personally reviewed today and demonstrated:  Sinus rhythm with left bundle branch block.  Recent Labwork:  July 2020: BUN 11, creatinine 0.71, potassium 3.8, AST 16, ALT 15, hemoglobin 11.5, platelets 264, TSH 1.07  Other Studies Reviewed Today:  Echocardiogram 02/04/2016: Study Conclusions  - Left ventricle: The cavity size was normal. Wall thickness was increased in a pattern of mild LVH. Systolic function was normal. The estimated ejection fraction was in the range of 55% to 60%. Diastolic function is abnormal, indeterminate grade. There is evidence of elevated LA pressure. Wall motion was normal; there were no regional wall motion abnormalities. - Aortic valve: Mildly calcified annulus. Trileaflet; normal thickness leaflets. Valve area (VTI): 1.88 cm^2. Valve area (Vmax): 1.78 cm^2. Valve area (Vmean): 1.7 cm^2. - Mitral valve: Mildly calcified annulus. Mildly thickened leaflets . There was mild regurgitation.  Assessment and Plan:  1.  Distant atrial fibrillation status post successful atrial fibrillation ablation.  She remains on Toprol-XL and Xarelto.  I reviewed interval lab  work.  She does not report any bleeding problems.  2.  History of nonischemic cardiomyopathy with normalization of LVEF.  3.  Essential hypertension, blood pressure elevated today.  She reports compliance with her medications.  Keep follow-up with PCP.  Walking plan and salt restriction recommended.  4.  CAD status post DES to the obtuse marginal in 2012.  She does not report any active angina symptoms.  She is not on aspirin given concurrent use of Xarelto.  Continue Zetia, Toprol-XL, and Cozaar.  Medication Adjustments/Labs and Tests Ordered: Current medicines are reviewed at length with the patient today.  Concerns regarding medicines are outlined above.   Tests Ordered: No orders of the defined types were placed in this encounter.   Medication Changes: Meds ordered this encounter  Medications  . rivaroxaban (XARELTO) 20 MG TABS tablet    Sig: Take 1 tablet (20 mg total) by mouth daily with supper. TAKE ONE TABLET BY MOUTH EVERY DAY with SUPPER    Dispense:  21 tablet    Refill:  0    LOT# [id number] [id number] 9/22    Disposition:  Follow up 6 months in the [place] office.  Signed, Satira Sark, MD, [facility] 02/17/2019 10:50 AM    [facility] [place], [place], [place] [address] Phone: [phone]; Fax: [phone]

## 2019-02-17 NOTE — Patient Instructions (Addendum)

## 2019-03-15 DIAGNOSIS — C50912 Malignant neoplasm of unspecified site of left female breast: Secondary | ICD-10-CM | POA: Diagnosis not present

## 2019-03-16 DIAGNOSIS — E7800 Pure hypercholesterolemia, unspecified: Secondary | ICD-10-CM | POA: Diagnosis not present

## 2019-03-16 DIAGNOSIS — I10 Essential (primary) hypertension: Secondary | ICD-10-CM | POA: Diagnosis not present

## 2019-03-23 DIAGNOSIS — M545 Low back pain: Secondary | ICD-10-CM | POA: Diagnosis not present

## 2019-03-23 DIAGNOSIS — R42 Dizziness and giddiness: Secondary | ICD-10-CM | POA: Diagnosis not present

## 2019-03-23 DIAGNOSIS — M47816 Spondylosis without myelopathy or radiculopathy, lumbar region: Secondary | ICD-10-CM | POA: Diagnosis not present

## 2019-03-26 ENCOUNTER — Other Ambulatory Visit: Payer: Self-pay | Admitting: Cardiology

## 2019-03-31 ENCOUNTER — Other Ambulatory Visit: Payer: Self-pay | Admitting: Internal Medicine

## 2019-03-31 ENCOUNTER — Other Ambulatory Visit: Payer: Self-pay | Admitting: Interventional Cardiology

## 2019-04-03 ENCOUNTER — Other Ambulatory Visit: Payer: Self-pay

## 2019-04-03 DIAGNOSIS — I10 Essential (primary) hypertension: Secondary | ICD-10-CM | POA: Diagnosis not present

## 2019-04-03 DIAGNOSIS — M545 Low back pain: Secondary | ICD-10-CM | POA: Diagnosis not present

## 2019-04-03 NOTE — Telephone Encounter (Signed)
Medication already addressed

## 2019-04-18 ENCOUNTER — Other Ambulatory Visit: Payer: Self-pay | Admitting: Physician Assistant

## 2019-04-18 DIAGNOSIS — M545 Low back pain, unspecified: Secondary | ICD-10-CM

## 2019-05-09 ENCOUNTER — Ambulatory Visit
Admission: RE | Admit: 2019-05-09 | Discharge: 2019-05-09 | Disposition: A | Discharge status: Home / Self Care | Payer: Medicare Other | Source: Ambulatory Visit | Attending: Physician Assistant | Admitting: Physician Assistant

## 2019-05-09 DIAGNOSIS — M48061 Spinal stenosis, lumbar region without neurogenic claudication: Secondary | ICD-10-CM | POA: Diagnosis not present

## 2019-05-09 DIAGNOSIS — M545 Low back pain, unspecified: Secondary | ICD-10-CM

## 2019-05-18 DIAGNOSIS — M48062 Spinal stenosis, lumbar region with neurogenic claudication: Secondary | ICD-10-CM | POA: Diagnosis not present

## 2019-05-18 DIAGNOSIS — M545 Low back pain: Secondary | ICD-10-CM | POA: Diagnosis not present

## 2019-05-19 ENCOUNTER — Ambulatory Visit: Payer: Self-pay | Admitting: Internal Medicine

## 2019-05-25 DIAGNOSIS — M47816 Spondylosis without myelopathy or radiculopathy, lumbar region: Secondary | ICD-10-CM | POA: Diagnosis not present

## 2019-05-25 DIAGNOSIS — M545 Low back pain: Secondary | ICD-10-CM | POA: Diagnosis not present

## 2019-05-29 DIAGNOSIS — H524 Presbyopia: Secondary | ICD-10-CM | POA: Diagnosis not present

## 2019-05-29 DIAGNOSIS — H353112 Nonexudative age-related macular degeneration, right eye, intermediate dry stage: Secondary | ICD-10-CM | POA: Diagnosis not present

## 2019-06-15 DIAGNOSIS — E7800 Pure hypercholesterolemia, unspecified: Secondary | ICD-10-CM | POA: Diagnosis not present

## 2019-06-19 DIAGNOSIS — M47816 Spondylosis without myelopathy or radiculopathy, lumbar region: Secondary | ICD-10-CM | POA: Diagnosis not present

## 2019-06-19 DIAGNOSIS — M545 Low back pain: Secondary | ICD-10-CM | POA: Diagnosis not present

## 2019-07-21 ENCOUNTER — Ambulatory Visit: Payer: Medicare Other | Admitting: Internal Medicine

## 2019-08-18 ENCOUNTER — Ambulatory Visit (INDEPENDENT_AMBULATORY_CARE_PROVIDER_SITE_OTHER): Payer: Medicare Other | Admitting: Cardiology

## 2019-08-18 ENCOUNTER — Other Ambulatory Visit: Payer: Self-pay

## 2019-08-18 ENCOUNTER — Encounter: Payer: Self-pay | Admitting: Cardiology

## 2019-08-18 VITALS — BP 140/80 | HR 68 | Ht 64.0 in | Wt 196.0 lb

## 2019-08-18 DIAGNOSIS — Z8679 Personal history of other diseases of the circulatory system: Secondary | ICD-10-CM | POA: Diagnosis not present

## 2019-08-18 DIAGNOSIS — I4819 Other persistent atrial fibrillation: Secondary | ICD-10-CM

## 2019-08-18 NOTE — Progress Notes (Signed)
Cardiology Office Note  Date: 08/18/2019   ID: [name] Fry, [place] 1945/06/16, MRN [id number]  PCP:  [name], MD  Cardiologist:  Rozann Lesches, MD Electrophysiologist:  Thompson Grayer, MD   Chief Complaint  Patient presents with  . Cardiac follow-up    History of Present Illness: [name] Fry is a 74 y.o. female last seen in December 2020.  She presents for a routine visit.  Since last assessment she does not describe any sense of palpitations and has had no angina symptoms.  She is troubled by chronic back pain.  She remains functional with ADLs, is raising a garden.  She states that she has been taking her medications regularly as outlined below.  She continues to follow with [facility].  She has a history of atrial fibrillation ablation, followed by Dr. [name].  I personally reviewed her ECG today which shows a sinus rhythm with left bundle branch block and PVC.  Past Medical History:  Diagnosis Date  . Arthritis   . Atrial fibrillation [facility])    Documented January 2017  . Breast cancer ([place])    Left  . Cataracts, bilateral   . Coronary atherosclerosis of native coronary artery    DES OM 3/12  . Essential hypertension   . GERD (gastroesophageal reflux disease)   . H/O hiatal hernia   . Hyperlipidemia   . Hypothyroidism   . Sleep apnea     Past Surgical History:  Procedure Laterality Date  . [place] VITRECTOMY WITH 20 GAUGE MVR PORT Right 09/29/2012   Procedure: 25 GAUGE PARS PLANA VITRECTOMY WITH 20 GAUGE MVR PORT;  Surgeon: Hayden Pedro, MD;  Location: [place];  Service: Ophthalmology;  Laterality: Right;  . APPENDECTOMY    . CARDIAC CATHETERIZATION N/A 07/26/2015   Procedure: Left Heart Cath and Coronary Angiography;  Surgeon: [name], MD;  Location: [place] CV LAB;  Service: Cardiovascular;  Laterality: N/A;  . CARDIOVERSION N/A 08/30/2015   Procedure: CARDIOVERSION;  Surgeon: Satira Sark, MD;  [facility];  Service:  Cardiovascular;  Laterality: N/A;  . CATARACT EXTRACTION W/PHACO Right 05/18/2013   Procedure: CATARACT EXTRACTION PHACO AND INTRAOCULAR LENS PLACEMENT RIGHT EYE;  Surgeon: Tonny Branch, MD;  [facility];  Service: Ophthalmology;  Laterality: Right;  CDE:13.35  . CATARACT EXTRACTION W/PHACO Left 02/10/2016   Procedure: CATARACT EXTRACTION PHACO AND INTRAOCULAR LENS PLACEMENT LEFT EYE: CDE:  5.39;  Surgeon: Tonny Branch, MD;  [facility];  Service: Ophthalmology;  Laterality: Left;  . CHOLECYSTECTOMY    . COLONOSCOPY N/A 05/02/2018   Procedure: COLONOSCOPY;  Surgeon: Rogene Houston, MD;  [facility];  Service: Endoscopy;  Laterality: N/A;  1:15  . ELECTROPHYSIOLOGIC STUDY N/A 11/19/2015   Procedure: Atrial Fibrillation Ablation;  Surgeon: Thompson Grayer, MD;  Location: [place] CV LAB;  Service: Cardiovascular;  Laterality: N/A;  . GAS/FLUID EXCHANGE Right 09/29/2012   Procedure: GAS/FLUID EXCHANGE;  Surgeon: Hayden Pedro, MD;  Location: [place];  Service: Ophthalmology;  Laterality: Right;  . LASER PHOTO ABLATION Right 09/29/2012   Procedure: LASER PHOTO ABLATION;  Surgeon: Hayden Pedro, MD;  Location: [place];  Service: Ophthalmology;  Laterality: Right;  Headscope laser  . MASTECTOMY Left    1997; S/P chemotherapy/radiation  . MEMBRANE PEEL Right 09/29/2012   Procedure: MEMBRANE PEEL;  Surgeon: Hayden Pedro, MD;  Location: [place];  Service: Ophthalmology;  Laterality: Right;  . POLYPECTOMY  05/02/2018   Procedure:  POLYPECTOMY;  Surgeon: Rogene Houston, MD;  [facility];  Service: Endoscopy;;  sigmoid colon times two, transverse colon times one  . TEE WITHOUT CARDIOVERSION N/A 11/19/2015   Procedure: TRANSESOPHAGEAL ECHOCARDIOGRAM (TEE);  Surgeon: Thayer Headings, MD;  Location: [place];  Service: Cardiovascular;  Laterality: N/A;  . TUBAL LIGATION      Current Outpatient Medications  Medication Sig Dispense Refill  . acetaminophen (TYLENOL) 500 MG tablet  Take 500-1,000 mg by mouth every 6 (six) hours as needed for moderate pain or headache.    . cyclobenzaprine (FLEXERIL) 5 MG tablet Take 5 mg by mouth at bedtime.    Marland Kitchen ezetimibe (ZETIA) 10 MG tablet Take 10 mg by mouth daily.  2  . furosemide (LASIX) 40 MG tablet Take 1 tablet (40 mg total) by mouth daily as needed for fluid (or weight gain). 30 tablet 2  . GARLIC OIL PO Take 1 capsule by mouth as needed.     . hydrochlorothiazide (HYDRODIURIL) 12.5 MG tablet TAKE 1 TABLET BY MOUTH DAILY 90 tablet 2  . levothyroxine (SYNTHROID, LEVOTHROID) 125 MCG tablet Take 125 mcg by mouth daily before breakfast.   3  . losartan (COZAAR) 100 MG tablet TAKE 1 TABLET BY MOUTH DAILY 90 tablet 2  . metoprolol succinate (TOPROL-XL) 25 MG 24 hr tablet TAKE 1 TABLET BY MOUTH EVERY DAY 90 tablet 1  . Multiple Vitamins-Minerals (OCUVITE PRESERVISION PO) Take 1 tablet by mouth 2 (two) times daily.     Marland Kitchen NITROSTAT 0.4 MG SL tablet PLACE 1 TABLET UNDER THE TONGUE EVERY 5 MINUTES FOR 3 DOSES AS NEEDED CHEST PAIN (Patient taking differently: Place 0.4 mg under the tongue every 5 (five) minutes as needed for chest pain. ) 25 tablet 3  . traMADol (ULTRAM) 50 MG tablet Take 50 mg by mouth 2 (two) times daily.    Alveda Reasons 20 MG TABS tablet TAKE 1 TABLET BY MOUTH EVERY DAY WITH SUPPER 30 tablet 6   No current facility-administered medications for this visit.   Allergies:  Doxycycline   ROS:  Chronic back pain.  Physical Exam: VS:  BP 140/80   Pulse 68   Ht 5\' 4"  (1.626 m)   Wt 196 lb (88.9 kg)   LMP 09/28/2012   SpO2 97%   BMI 33.64 kg/m , BMI Body mass index is 33.64 kg/m.  Wt Readings from Last 3 Encounters:  08/18/19 196 lb (88.9 kg)  02/17/19 195 lb 9.6 oz (88.7 kg)  08/15/18 195 lb 3.2 oz (88.5 kg)    General: Patient appears comfortable at rest. HEENT: Conjunctiva and lids normal, wearing a mask. Neck: Supple, no elevated JVP or carotid bruits, no thyromegaly. Lungs: Clear to auscultation, nonlabored  breathing at rest. Cardiac: Regular rate and rhythm, no S3 or significant systolic murmur, no pericardial rub. Extremities: Trace ankle edema, distal pulses 2+.  ECG:  An ECG dated 05/20/2018 was personally reviewed today and demonstrated:  Sinus rhythm with left bundle branch block.  Recent Labwork:  October 2020: BUN 15, creatinine 0.82, potassium 4.1, AST 17, ALT 11, hemoglobin 12.4, platelets 262, TSH 1.83, cholesterol 260, triglycerides 385, HDL 51, LDL 139  Other Studies Reviewed Today:  Echocardiogram 02/04/2016: Study Conclusions  - Left ventricle: The cavity size was normal. Wall thickness was increased in a pattern of mild LVH. Systolic function was normal. The estimated ejection fraction was in the range of 55% to 60%. Diastolic function is abnormal, indeterminate grade. There is evidence of elevated  LA pressure. Wall motion was normal; there were no regional wall motion abnormalities. - Aortic valve: Mildly calcified annulus. Trileaflet; normal thickness leaflets. Valve area (VTI): 1.88 cm^2. Valve area (Vmax): 1.78 cm^2. Valve area (Vmean): 1.7 cm^2. - Mitral valve: Mildly calcified annulus. Mildly thickened leaflets . There was mild regurgitation.  Assessment and Plan:  1.  History of persistent atrial fibrillation status post successful ablation. She remains on Xarelto and Toprol-XL, reports no bleeding problems.  I reviewed her lab work from October 2020, she will be seeing her PCP this summer.  2.  CAD status post DES to the obtuse marginal in 2012.  She does not describe any active angina.  Continue Zetia, Toprol-XL, and losartan.  3.  History of nonischemic cardiomyopathy with normalization of LVEF.  Medication Adjustments/Labs and Tests Ordered: Current medicines are reviewed at length with the patient today.  Concerns regarding medicines are outlined above.   Tests Ordered: Orders Placed This Encounter  Procedures  . EKG 12-Lead     Medication Changes: No orders of the defined types were placed in this encounter.   Disposition:  Follow up 6 months in the [place] office.  Signed, Satira Sark, MD, [facility] 08/18/2019 11:59 AM    [facility], [place], [place] [address] Phone: [phone]; Fax: [phone]

## 2019-08-18 NOTE — Patient Instructions (Signed)
Medication Instructions:    Your physician recommends that you continue on your current medications as directed. Please refer to the Current Medication list given to you today.  Labwork:  NONE  Testing/Procedures:  NONE  Follow-Up:  Your physician recommends that you schedule a follow-up appointment in: 6 months (office)  Any Other Special Instructions Will Be Listed Below (If Applicable).  If you need a refill on your cardiac medications before your next appointment, please call your pharmacy. 

## 2019-09-08 ENCOUNTER — Other Ambulatory Visit: Payer: Self-pay

## 2019-09-08 ENCOUNTER — Ambulatory Visit: Payer: Medicare Other | Admitting: Internal Medicine

## 2019-09-08 ENCOUNTER — Encounter: Payer: Self-pay | Admitting: Internal Medicine

## 2019-09-08 VITALS — BP 140/70 | HR 70 | Ht 64.0 in | Wt 198.0 lb

## 2019-09-08 DIAGNOSIS — I428 Other cardiomyopathies: Secondary | ICD-10-CM | POA: Diagnosis not present

## 2019-09-08 DIAGNOSIS — I10 Essential (primary) hypertension: Secondary | ICD-10-CM

## 2019-09-08 DIAGNOSIS — I4819 Other persistent atrial fibrillation: Secondary | ICD-10-CM

## 2019-09-08 DIAGNOSIS — M47816 Spondylosis without myelopathy or radiculopathy, lumbar region: Secondary | ICD-10-CM | POA: Diagnosis not present

## 2019-09-08 DIAGNOSIS — L03115 Cellulitis of right lower limb: Secondary | ICD-10-CM | POA: Diagnosis not present

## 2019-09-08 NOTE — Addendum Note (Signed)
Addended by: [name] on: 09/08/2019 04:02 PM   Modules accepted: Orders

## 2019-09-08 NOTE — Progress Notes (Signed)
PCP: [name], MD Primary Cardiologist: Dr Domenic Polite Primary EP: Dr [name]  [name] Fry is a 74 y.o. female who presents today for routine electrophysiology followup.  Since last being seen in our clinic, the patient reports doing very well.  Today, she denies symptoms of palpitations, chest pain, shortness of breath,  lower extremity edema, dizziness, presyncope, or syncope. Her primary concern is with chronic back pain.  The patient is otherwise without complaint today.   Past Medical History:  Diagnosis Date  . Arthritis   . Atrial fibrillation [facility])    Documented January 2017  . Breast cancer ([place])    Left  . Cataracts, bilateral   . Coronary atherosclerosis of native coronary artery    DES OM 3/12  . Essential hypertension   . GERD (gastroesophageal reflux disease)   . H/O hiatal hernia   . Hyperlipidemia   . Hypothyroidism   . Sleep apnea    Past Surgical History:  Procedure Laterality Date  . [place] VITRECTOMY WITH 20 GAUGE MVR PORT Right 09/29/2012   Procedure: 25 GAUGE PARS PLANA VITRECTOMY WITH 20 GAUGE MVR PORT;  Surgeon: Hayden Pedro, MD;  Location: [place];  Service: Ophthalmology;  Laterality: Right;  . APPENDECTOMY    . CARDIAC CATHETERIZATION N/A 07/26/2015   Procedure: Left Heart Cath and Coronary Angiography;  Surgeon: [name], MD;  Location: [place] CV LAB;  Service: Cardiovascular;  Laterality: N/A;  . CARDIOVERSION N/A 08/30/2015   Procedure: CARDIOVERSION;  Surgeon: Satira Sark, MD;  [facility];  Service: Cardiovascular;  Laterality: N/A;  . CATARACT EXTRACTION W/PHACO Right 05/18/2013   Procedure: CATARACT EXTRACTION PHACO AND INTRAOCULAR LENS PLACEMENT RIGHT EYE;  Surgeon: Tonny Branch, MD;  [facility];  Service: Ophthalmology;  Laterality: Right;  CDE:13.35  . CATARACT EXTRACTION W/PHACO Left 02/10/2016   Procedure: CATARACT EXTRACTION PHACO AND INTRAOCULAR LENS PLACEMENT LEFT EYE: CDE:  5.39;  Surgeon:  Tonny Branch, MD;  [facility];  Service: Ophthalmology;  Laterality: Left;  . CHOLECYSTECTOMY    . COLONOSCOPY N/A 05/02/2018   Procedure: COLONOSCOPY;  Surgeon: Rogene Houston, MD;  [facility];  Service: Endoscopy;  Laterality: N/A;  1:15  . ELECTROPHYSIOLOGIC STUDY N/A 11/19/2015   Procedure: Atrial Fibrillation Ablation;  Surgeon: Thompson Grayer, MD;  Location: [place] [facility];  Service: Cardiovascular;  Laterality: N/A;  . GAS/FLUID EXCHANGE Right 09/29/2012   Procedure: GAS/FLUID EXCHANGE;  Surgeon: Hayden Pedro, MD;  Location: [place];  Service: Ophthalmology;  Laterality: Right;  . LASER PHOTO ABLATION Right 09/29/2012   Procedure: LASER PHOTO ABLATION;  Surgeon: Hayden Pedro, MD;  Location: [place];  Service: Ophthalmology;  Laterality: Right;  Headscope laser  . MASTECTOMY Left    1997; S/P chemotherapy/radiation  . MEMBRANE PEEL Right 09/29/2012   Procedure: MEMBRANE PEEL;  Surgeon: Hayden Pedro, MD;  Location: [place];  Service: Ophthalmology;  Laterality: Right;  . POLYPECTOMY  05/02/2018   Procedure: POLYPECTOMY;  Surgeon: Rogene Houston, MD;  [facility];  Service: Endoscopy;;  sigmoid colon times two, transverse colon times one  . TEE WITHOUT CARDIOVERSION N/A 11/19/2015   Procedure: TRANSESOPHAGEAL ECHOCARDIOGRAM (TEE);  Surgeon: Thayer Headings, MD;  Location: [place];  Service: Cardiovascular;  Laterality: N/A;  . TUBAL LIGATION      ROS- all systems are reviewed and negatives except as per HPI above  Current Outpatient Medications  Medication Sig Dispense Refill  .  acetaminophen (TYLENOL) 500 MG tablet Take 500-1,000 mg by mouth every 6 (six) hours as needed for moderate pain or headache.    . cyclobenzaprine (FLEXERIL) 5 MG tablet Take 5 mg by mouth at bedtime.    Marland Kitchen ezetimibe (ZETIA) 10 MG tablet Take 10 mg by mouth daily.  2  . furosemide (LASIX) 40 MG tablet Take 1 tablet (40 mg total) by mouth daily as needed for fluid (or weight  gain). 30 tablet 2  . GARLIC OIL PO Take 1 capsule by mouth as needed.     . hydrochlorothiazide (HYDRODIURIL) 12.5 MG tablet TAKE 1 TABLET BY MOUTH DAILY 90 tablet 2  . levothyroxine (SYNTHROID, LEVOTHROID) 125 MCG tablet Take 125 mcg by mouth daily before breakfast.   3  . losartan (COZAAR) 100 MG tablet TAKE 1 TABLET BY MOUTH DAILY 90 tablet 2  . metoprolol succinate (TOPROL-XL) 25 MG 24 hr tablet TAKE 1 TABLET BY MOUTH EVERY DAY 90 tablet 1  . Multiple Vitamins-Minerals (OCUVITE PRESERVISION PO) Take 1 tablet by mouth 2 (two) times daily.     Marland Kitchen NITROSTAT 0.4 MG SL tablet PLACE 1 TABLET UNDER THE TONGUE EVERY 5 MINUTES FOR 3 DOSES AS NEEDED CHEST PAIN 25 tablet 3  . traMADol (ULTRAM) 50 MG tablet Take 50 mg by mouth 2 (two) times daily.    Alveda Reasons 20 MG TABS tablet TAKE 1 TABLET BY MOUTH EVERY DAY WITH SUPPER 30 tablet 6   No current facility-administered medications for this visit.    Physical Exam: Vitals:   09/08/19 1439  BP: 140/70  Pulse: 70  SpO2: 97%  Weight: 198 lb (89.8 kg)  Height: 5\' 4"  (1.626 m)    GEN- The patient is well appearing, alert and oriented x 3 today.   Head- normocephalic, atraumatic Eyes-  Sclera clear, conjunctiva pink Ears- hearing intact Oropharynx- clear Lungs-   normal work of breathing Heart- Regular rate and rhythm  GI- soft  Extremities- no clubbing, cyanosis, or edema  Wt Readings from Last 3 Encounters:  09/08/19 198 lb (89.8 kg)  08/18/19 196 lb (88.9 kg)  02/17/19 195 lb 9.6 oz (88.7 kg)    EKG tracing 08/18/19 is personally reviewed and shows sinus with LBBB  Assessment and Plan:  1. Persistent afib Well controlled post ablation off AAD therapy Continue xarelto for chads2vasc score of 5  2. HTN Stable No change required today  3. CAD No ischemic symptoms  4. Overweight Body mass index is 33.99 kg/m. Lifestyle modification is advised  5. Nonischemic CM Resolved with sinus Repeat echo on return Risks, benefits and  potential toxicities for medications prescribed and/or refilled reviewed with patient today.   Thompson Grayer MD, [facility] 09/08/2019 3:02 PM

## 2019-09-08 NOTE — Patient Instructions (Addendum)
Your physician wants you to follow-up in: [place] has requested that you have an echocardiogram in 1 year just before your next visit with Dr. [name]. Echocardiography is a painless test that uses sound waves to create images of your heart. It provides your doctor with information about the size and shape of your heart and how well your heart's chambers and valves are working. This procedure takes approximately one hour. There are no restrictions for this procedure.   Your physician recommends that you continue on your current medications as directed. Please refer to the Current Medication list given to you today.  Thank you for choosing [facility]!!

## 2019-09-15 ENCOUNTER — Ambulatory Visit: Payer: Medicare Other | Admitting: Internal Medicine

## 2019-09-22 DIAGNOSIS — E7800 Pure hypercholesterolemia, unspecified: Secondary | ICD-10-CM | POA: Diagnosis not present

## 2019-09-22 DIAGNOSIS — E039 Hypothyroidism, unspecified: Secondary | ICD-10-CM | POA: Diagnosis not present

## 2019-09-22 DIAGNOSIS — I10 Essential (primary) hypertension: Secondary | ICD-10-CM | POA: Diagnosis not present

## 2019-09-26 DIAGNOSIS — I10 Essential (primary) hypertension: Secondary | ICD-10-CM | POA: Diagnosis not present

## 2019-09-26 DIAGNOSIS — E039 Hypothyroidism, unspecified: Secondary | ICD-10-CM | POA: Diagnosis not present

## 2019-09-26 DIAGNOSIS — M47816 Spondylosis without myelopathy or radiculopathy, lumbar region: Secondary | ICD-10-CM | POA: Diagnosis not present

## 2019-09-26 DIAGNOSIS — E7800 Pure hypercholesterolemia, unspecified: Secondary | ICD-10-CM | POA: Diagnosis not present

## 2019-09-29 ENCOUNTER — Other Ambulatory Visit: Payer: Self-pay | Admitting: Interventional Cardiology

## 2019-10-17 DIAGNOSIS — J209 Acute bronchitis, unspecified: Secondary | ICD-10-CM | POA: Diagnosis not present

## 2019-10-17 DIAGNOSIS — R05 Cough: Secondary | ICD-10-CM | POA: Diagnosis not present

## 2019-10-19 ENCOUNTER — Telehealth: Payer: Self-pay | Admitting: Cardiology

## 2019-10-19 NOTE — Telephone Encounter (Signed)
Reports intermittent aching left chest pain when sneezing and randomly that radiates to left arm/shoulder rated 6/10 when sneezing and 3/10 when not sneezing, that started last week. Denies active chest pain. Reports it being an uncomfortable feeling. Reports congestion since last week that has improved. Denies dizziness, sob, fever, n/v or swelling. Reports seeing PCP this Tuesday and had a negative covid test. Reports that she's had cancer in past where the area hurts. Says she has not taken any nitroglycerin or tylenol. Advised to contact her PCP r/e these symptoms. Advised that message would be sent to provider for recommendations. Verbalized understanding.

## 2019-10-19 NOTE — Telephone Encounter (Signed)
Patient called stating that she has been feeling different for several days. States that she saw her PCP earlier in week for possible pneumonia. Only did a COVID test on her. Patient is wanting to know if she can come into office and have an EKG just to make certain nothing is going on with her heart.

## 2019-10-19 NOTE — Telephone Encounter (Signed)
Symptoms less likely to be anginal since associated with sneezing and otherwise sporadic.  Would agree with follow-up per PCP.  They might at least consider a chest x-ray.

## 2019-10-20 NOTE — Telephone Encounter (Signed)
Patient informed and verbalized understanding of plan. 

## 2019-10-23 DIAGNOSIS — I209 Angina pectoris, unspecified: Secondary | ICD-10-CM | POA: Diagnosis not present

## 2019-10-26 ENCOUNTER — Other Ambulatory Visit: Payer: Self-pay | Admitting: Cardiology

## 2019-12-13 ENCOUNTER — Telehealth: Payer: Self-pay | Admitting: Cardiology

## 2019-12-13 MED ORDER — RIVAROXABAN 20 MG PO TABS: ORAL_TABLET | ORAL | 0 refills | Status: DC

## 2019-12-13 NOTE — Telephone Encounter (Signed)
Advised that xarelto samples are available for pick up along with [name] Fry PAF application that she is to fill out all patient sections and bring back to office with proof of income and prescription out of pocket expense for 2021. Verbalized understanding.

## 2019-12-13 NOTE — Telephone Encounter (Signed)
Patient called stating she's in the doughnut hole w/ her XARELTO 20 MG TABS tablet [[id number]]  and would like to know if she can have samples   [phone]

## 2019-12-25 ENCOUNTER — Other Ambulatory Visit: Payer: Self-pay | Admitting: Cardiology

## 2019-12-27 DIAGNOSIS — E7800 Pure hypercholesterolemia, unspecified: Secondary | ICD-10-CM | POA: Diagnosis not present

## 2019-12-27 DIAGNOSIS — I10 Essential (primary) hypertension: Secondary | ICD-10-CM | POA: Diagnosis not present

## 2019-12-27 DIAGNOSIS — Z0000 Encounter for general adult medical examination without abnormal findings: Secondary | ICD-10-CM | POA: Diagnosis not present

## 2019-12-27 DIAGNOSIS — M47816 Spondylosis without myelopathy or radiculopathy, lumbar region: Secondary | ICD-10-CM | POA: Diagnosis not present

## 2019-12-27 DIAGNOSIS — E039 Hypothyroidism, unspecified: Secondary | ICD-10-CM | POA: Diagnosis not present

## 2020-01-02 DIAGNOSIS — E7800 Pure hypercholesterolemia, unspecified: Secondary | ICD-10-CM | POA: Diagnosis not present

## 2020-02-22 ENCOUNTER — Ambulatory Visit: Payer: Medicare Other | Admitting: Cardiology

## 2020-03-26 ENCOUNTER — Encounter: Payer: Self-pay | Admitting: Cardiology

## 2020-03-26 DIAGNOSIS — I10 Essential (primary) hypertension: Secondary | ICD-10-CM | POA: Diagnosis not present

## 2020-03-26 DIAGNOSIS — E039 Hypothyroidism, unspecified: Secondary | ICD-10-CM | POA: Diagnosis not present

## 2020-03-26 DIAGNOSIS — E7800 Pure hypercholesterolemia, unspecified: Secondary | ICD-10-CM | POA: Diagnosis not present

## 2020-03-26 DIAGNOSIS — M47816 Spondylosis without myelopathy or radiculopathy, lumbar region: Secondary | ICD-10-CM | POA: Diagnosis not present

## 2020-03-29 ENCOUNTER — Ambulatory Visit: Payer: Medicare Other | Admitting: Cardiology

## 2020-03-29 ENCOUNTER — Encounter: Payer: Self-pay | Admitting: *Deleted

## 2020-03-29 ENCOUNTER — Encounter: Payer: Self-pay | Admitting: Cardiology

## 2020-03-29 VITALS — BP 130/64 | HR 67 | Ht 63.0 in | Wt 191.0 lb

## 2020-03-29 DIAGNOSIS — E782 Mixed hyperlipidemia: Secondary | ICD-10-CM

## 2020-03-29 DIAGNOSIS — I4819 Other persistent atrial fibrillation: Secondary | ICD-10-CM | POA: Diagnosis not present

## 2020-03-29 DIAGNOSIS — I25119 Atherosclerotic heart disease of native coronary artery with unspecified angina pectoris: Secondary | ICD-10-CM

## 2020-03-29 DIAGNOSIS — Z789 Other specified health status: Secondary | ICD-10-CM | POA: Diagnosis not present

## 2020-03-29 NOTE — Patient Instructions (Signed)
Medication Instructions:  Continue all current medications.  Labwork: none  Testing/Procedures: none  Follow-Up: 6 months   Any Other Special Instructions Will Be Listed Below (If Applicable). Will send message to [facility] regarding other options for cholesterol treatment.   If you need a refill on your cardiac medications before your next appointment, please call your pharmacy.

## 2020-03-29 NOTE — Progress Notes (Signed)
Cardiology Office Note  Date: 03/29/2020   ID: [name] Fry, DOB 02-Dec-1945, MRN [id number]  PCP:  Lawerance Sabal, PA  Cardiologist:  Nona Dell, MD Electrophysiologist:  Hillis Range, MD   Chief Complaint  Patient presents with  . Cardiac follow-up    History of Present Illness: [name] Fry is a 75 y.o. female last seen in June 2021.  She presents for a routine visit.  Since last assessment, she does not report any recurrent angina symptoms or significant palpitations.  She had a follow-up visit with Dr. Johney Frame in June 2021 as well, I reviewed the note.  We went over her medications today.  She does not report any bleeding problems on Xarelto.  She did have lab work with PCP recently, results being requested.  She has a history of multistatin intolerance as well as intolerance to Zetia.  I did talk with her today about considering a PCSK9 inhibitor.  Past Medical History:  Diagnosis Date  . Arthritis   . Atrial fibrillation [facility])    Documented January 2017  . Breast cancer (HCC)    Left  . Cataracts, bilateral   . Coronary atherosclerosis of native coronary artery    DES OM 3/12  . Essential hypertension   . GERD (gastroesophageal reflux disease)   . H/O hiatal hernia   . Hyperlipidemia   . Hypothyroidism   . Sleep apnea     Past Surgical History:  Procedure Laterality Date  . 25 GAUGE PARS PLANA VITRECTOMY WITH 20 GAUGE MVR PORT Right 09/29/2012   Procedure: 25 GAUGE PARS PLANA VITRECTOMY WITH 20 GAUGE MVR PORT;  Surgeon: Sherrie George, MD;  Location: [facility] OR;  Service: Ophthalmology;  Laterality: Right;  . APPENDECTOMY    . CARDIAC CATHETERIZATION N/A 07/26/2015   Procedure: Left Heart Cath and Coronary Angiography;  Surgeon: Tonny Bollman, MD;  Location: [facility] INVASIVE CV LAB;  Service: Cardiovascular;  Laterality: N/A;  . CARDIOVERSION N/A 08/30/2015   Procedure: CARDIOVERSION;  Surgeon: Jonelle Sidle, MD;  [facility];  Service:  Cardiovascular;  Laterality: N/A;  . CATARACT EXTRACTION W/PHACO Right 05/18/2013   Procedure: CATARACT EXTRACTION PHACO AND INTRAOCULAR LENS PLACEMENT RIGHT EYE;  Surgeon: Gemma Payor, MD;  Location: [facility] ORS;  Service: Ophthalmology;  Laterality: Right;  CDE:13.35  . CATARACT EXTRACTION W/PHACO Left 02/10/2016   Procedure: CATARACT EXTRACTION PHACO AND INTRAOCULAR LENS PLACEMENT LEFT EYE: CDE:  5.39;  Surgeon: Gemma Payor, MD;  Location: [facility] ORS;  Service: Ophthalmology;  Laterality: Left;  . CHOLECYSTECTOMY    . COLONOSCOPY N/A 05/02/2018   Procedure: COLONOSCOPY;  Surgeon: Malissa Hippo, MD;  [facility] END[facility];  Service: Endoscopy;  Laterality: N/A;  1:15  . ELECTROPHYSIOLOGIC STUDY N/A 11/19/2015   Procedure: Atrial Fibrillation Ablation;  Surgeon: Hillis Range, MD;  Location: [facility] INVASIVE CV LAB;  Service: Cardiovascular;  Laterality: N/A;  . GAS/FLUID EXCHANGE Right 09/29/2012   Procedure: GAS/FLUID EXCHANGE;  Surgeon: Sherrie George, MD;  Location: [facility] OR;  Service: Ophthalmology;  Laterality: Right;  . LASER PHOTO ABLATION Right 09/29/2012   Procedure: LASER PHOTO ABLATION;  Surgeon: Sherrie George, MD;  Location: [facility] OR;  Service: Ophthalmology;  Laterality: Right;  Headscope laser  . MASTECTOMY Left    1997; S/P chemotherapy/radiation  . MEMBRANE PEEL Right 09/29/2012   Procedure: MEMBRANE PEEL;  Surgeon: Sherrie George, MD;  Location: [facility] OR;  Service: Ophthalmology;  Laterality: Right;  . POLYPECTOMY  05/02/2018   Procedure: POLYPECTOMY;  Surgeon: Rogene Houston, MD;  [facility];  Service: Endoscopy;;  sigmoid colon times two, transverse colon times one  . TEE WITHOUT CARDIOVERSION N/A 11/19/2015   Procedure: TRANSESOPHAGEAL ECHOCARDIOGRAM (TEE);  Surgeon: Thayer Headings, MD;  Location: [place];  Service: Cardiovascular;  Laterality: N/A;  . TUBAL LIGATION      Current Outpatient Medications  Medication Sig Dispense Refill  . acetaminophen (TYLENOL) 500 MG tablet  Take 500-1,000 mg by mouth every 6 (six) hours as needed for moderate pain or headache.    . cyclobenzaprine (FLEXERIL) 5 MG tablet Take 5 mg by mouth at bedtime.    . furosemide (LASIX) 40 MG tablet Take 1 tablet (40 mg total) by mouth daily as needed for fluid (or weight gain). 30 tablet 2  . hydrochlorothiazide (HYDRODIURIL) 12.5 MG tablet TAKE 1 TABLET BY MOUTH DAILY 90 tablet 2  . levothyroxine (SYNTHROID, LEVOTHROID) 125 MCG tablet Take 125 mcg by mouth daily before breakfast.   3  . losartan (COZAAR) 100 MG tablet TAKE 1 TABLET BY MOUTH DAILY 90 tablet 2  . metoprolol succinate (TOPROL-XL) 25 MG 24 hr tablet TAKE 1 TABLET BY MOUTH EVERY DAY 90 tablet 3  . Multiple Vitamins-Minerals (OCUVITE PRESERVISION PO) Take 1 tablet by mouth 2 (two) times daily.     Marland Kitchen NITROSTAT 0.4 MG SL tablet PLACE 1 TABLET UNDER THE TONGUE EVERY 5 MINUTES FOR 3 DOSES AS NEEDED CHEST PAIN 25 tablet 3  . rivaroxaban (XARELTO) 20 MG TABS tablet TAKE 1 TABLET BY MOUTH EVERY DAY WITH SUPPER 21 tablet 0  . traMADol (ULTRAM) 50 MG tablet Take 50 mg by mouth 2 (two) times daily.     No current facility-administered medications for this visit.   Allergies:  Doxycycline   ROS: No syncope.  Physical Exam: VS:  BP 130/64   Pulse 67   Ht 5\' 3"  (1.6 m)   Wt 191 lb (86.6 kg)   LMP 09/28/2012   SpO2 98%   BMI 33.83 kg/m , BMI Body mass index is 33.83 kg/m.  Wt Readings from Last 3 Encounters:  03/29/20 191 lb (86.6 kg)  09/08/19 198 lb (89.8 kg)  08/18/19 196 lb (88.9 kg)    General: Patient appears comfortable at rest. HEENT: Conjunctiva and lids normal, wearing a mask. Neck: Supple, no elevated JVP or carotid bruits, no thyromegaly. Lungs: Clear to auscultation, nonlabored breathing at rest. Cardiac: Regular rate and rhythm, no S3 or significant systolic murmur, no pericardial rub. Extremities: No pitting edema.  ECG:  An ECG dated 08/18/2019 was personally reviewed today and demonstrated:  Sinus rhythm with  left bundle branch block and PVC.  Recent Labwork:  October 2020: BUN 15, creatinine 0.82, potassium 4.1, AST 17, ALT 11, hemoglobin 12.4, platelets 262, TSH 1.83, cholesterol 260, triglycerides 385, HDL 51, LDL 139  Other Studies Reviewed Today:  Echocardiogram 02/04/2016: Study Conclusions  - Left ventricle: The cavity size was normal. Wall thickness was increased in a pattern of mild LVH. Systolic function was normal. The estimated ejection fraction was in the range of 55% to 60%. Diastolic function is abnormal, indeterminate grade. There is evidence of elevated LA pressure. Wall motion was normal; there were no regional wall motion abnormalities. - Aortic valve: Mildly calcified annulus. Trileaflet; normal thickness leaflets. Valve area (VTI): 1.88 cm^2. Valve area (Vmax): 1.78 cm^2. Valve area (Vmean): 1.7 cm^2. - Mitral valve: Mildly calcified annulus. Mildly thickened leaflets . There was mild regurgitation.  Assessment and Plan:  1. Persistent atrial fibrillation status post successful ablation.  She continues to do well without active palpitations.  CHA2DS2-VASc score is 5 and she continues on Xarelto along with Toprol-XL.  Requesting recent lab work for review.  2. CAD status post DES to the obtuse marginal in 2012.  She is doing well without active angina symptoms.  Continue losartan and Toprol-XL, not on aspirin given use of Xarelto.  3. Mixed hyperlipidemia with multi statin intolerance and intolerance to Zetia (myalgias in both cases).  We discussed possibility of PCSK9 inhibitor and will check on feasibility of this with our lipid clinic.  Requesting recent lipid panel from PCP.  Medication Adjustments/Labs and Tests Ordered: Current medicines are reviewed at length with the patient today.  Concerns regarding medicines are outlined above.   Tests Ordered: No orders of the defined types were placed in this encounter.   Medication Changes: No  orders of the defined types were placed in this encounter.   Disposition:  Follow up 6 months in the [place] office.  Signed, Satira Sark, MD, [facility] 03/29/2020 2:29 PM    [facility] at [place], [place],  [address] Phone: [phone]; Fax: [phone]

## 2020-04-15 DIAGNOSIS — R079 Chest pain, unspecified: Secondary | ICD-10-CM | POA: Diagnosis not present

## 2020-04-15 DIAGNOSIS — T7840XA Allergy, unspecified, initial encounter: Secondary | ICD-10-CM | POA: Diagnosis not present

## 2020-04-15 DIAGNOSIS — I10 Essential (primary) hypertension: Secondary | ICD-10-CM | POA: Diagnosis not present

## 2020-04-18 DIAGNOSIS — Z1231 Encounter for screening mammogram for malignant neoplasm of breast: Secondary | ICD-10-CM | POA: Diagnosis not present

## 2020-04-30 IMAGING — MR MR LUMBAR SPINE W/O CM
4 of 5 series · 25 of 48 positions shown · non-contrast
Comparison: Lumbar MRI 08/13/2017
COMPARISON: Lumbar MRI 08/13/2017

Addendum:
CLINICAL DATA: Acute bilateral low back pain. History of breast
cancer 6222. No back surgery.

EXAM:
MRI LUMBAR SPINE WITHOUT CONTRAST
TECHNIQUE: Multiplanar, multisequence MR imaging of the lumbar spine was
performed. No intravenous contrast was administered.

[Series 4: T2 post-contrast · sagittal · 4.0mm · 0.55mm/px · 6 of 13 slices shown]
[im 1/13]
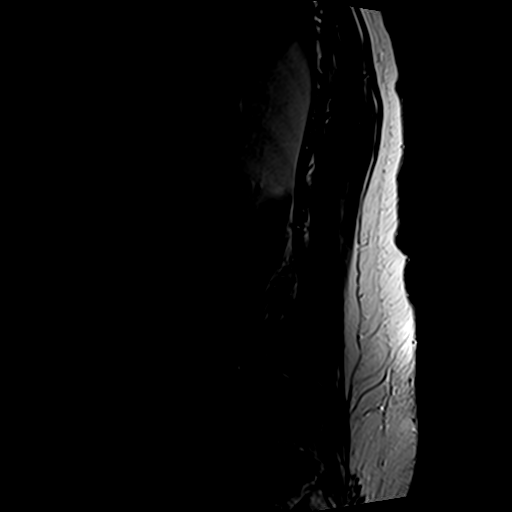
[im 3/13]
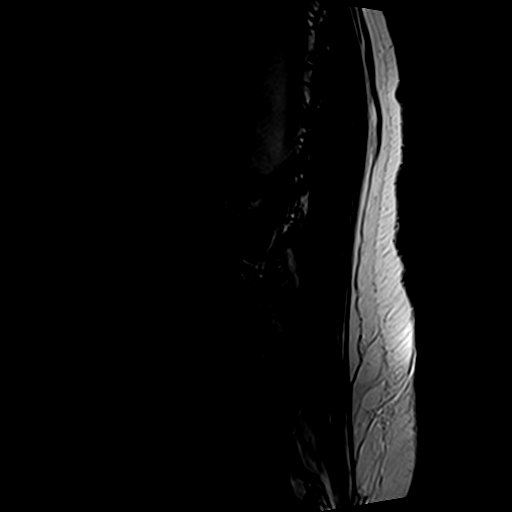
[im 5/13]
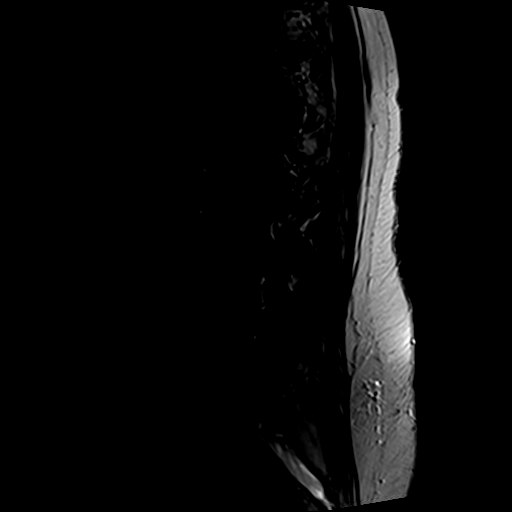
[im 8/13]
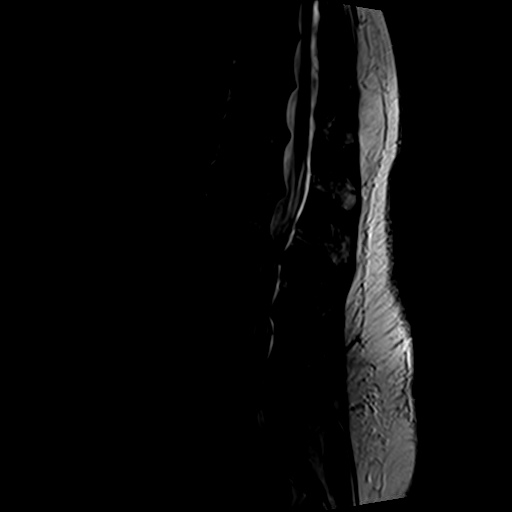
[im 10/13]
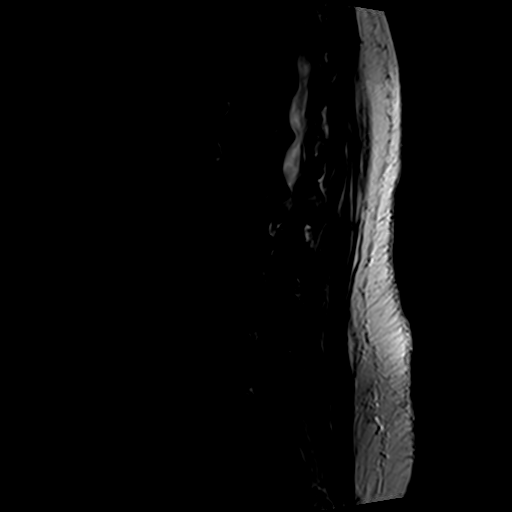
[im 13/13]
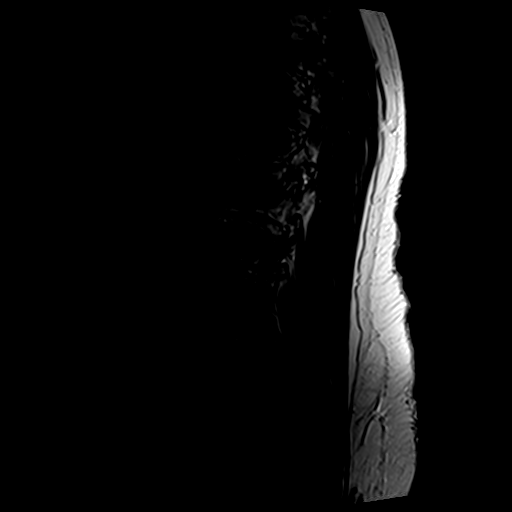

[Series 6: T1 · sagittal · 4.0mm · 0.55mm/px · 5 of 13 slices shown (1 of 2)]
[im 1/13]
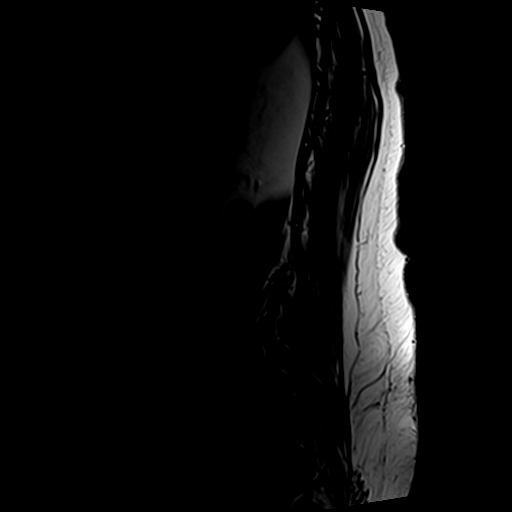
[im 4/13]
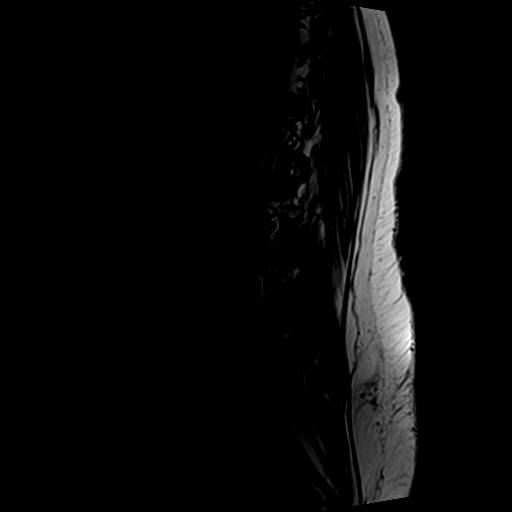
[im 7/13]
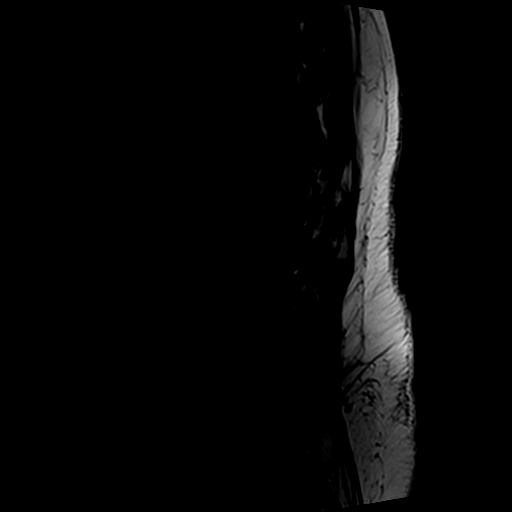
[im 10/13]
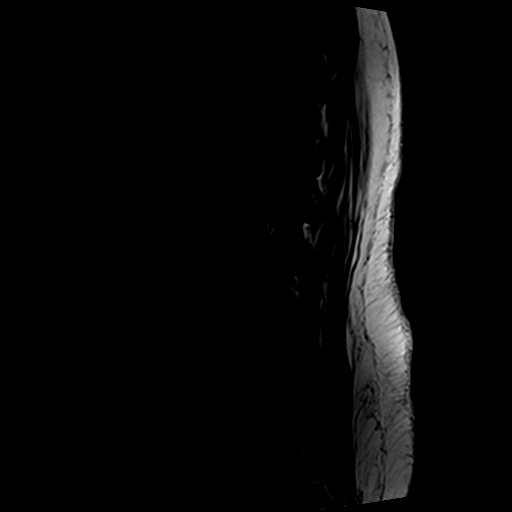
[im 13/13]
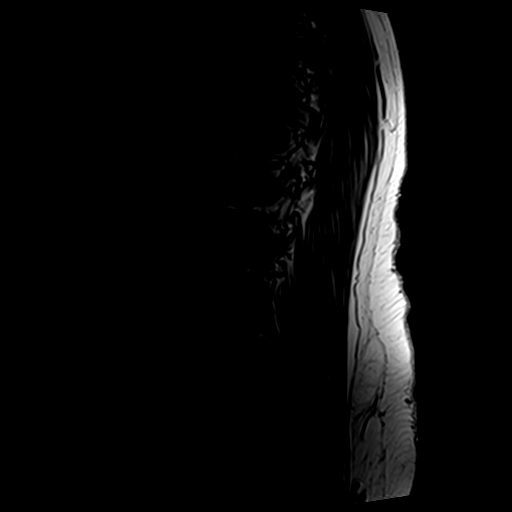

[Series 7: T1 · axial · 4.0mm · 0.35mm/px · z∈[-143,+21]mm · 4 of 38 slices shown (2 of 2)]
[im 3/38]
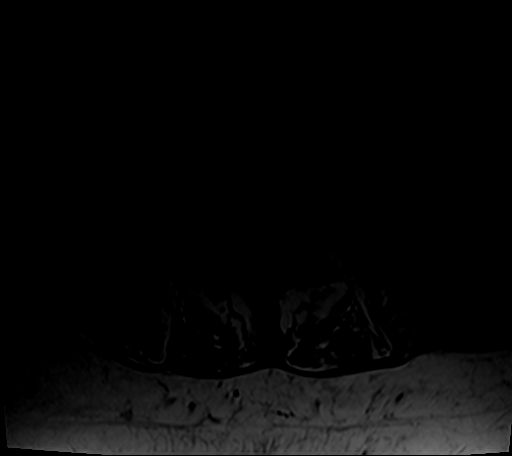
[im 5/38]
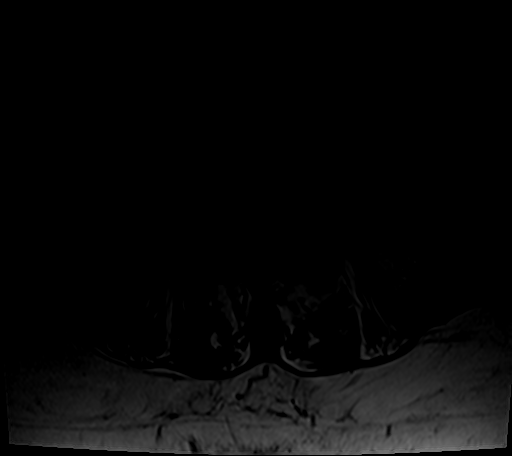
[im 20/38]
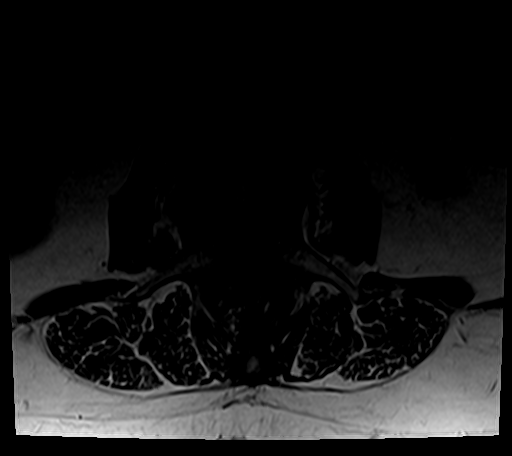
[im 33/38]
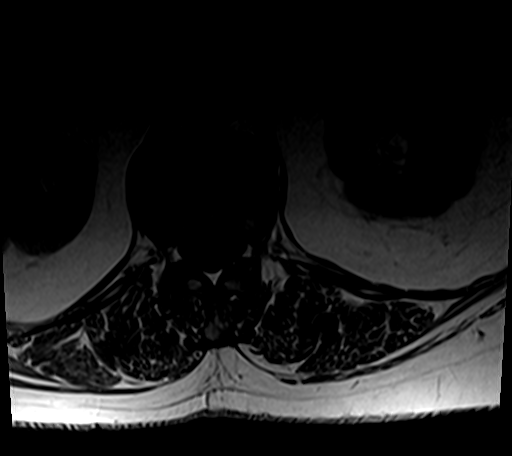

[Series 8: T2 · axial · 4.0mm · 0.70mm/px · z∈[-143,+65]mm · 10 of 38 slices shown]
[im 3/38]
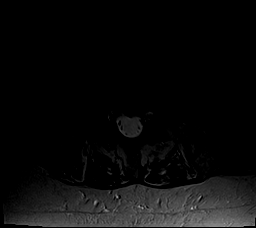
[im 5/38]
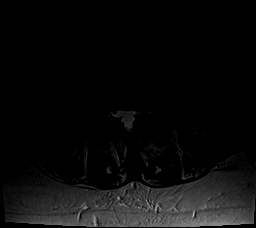
[im 8/38]
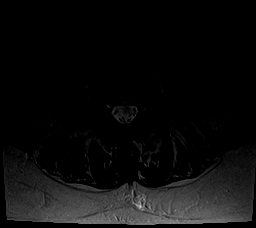
[im 13/38]
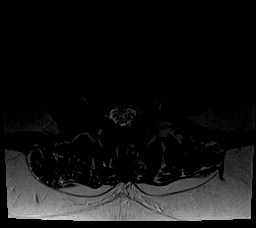
[im 18/38]
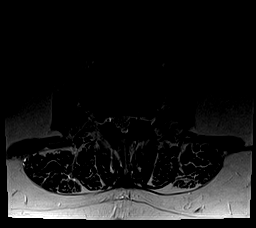
[im 20/38]
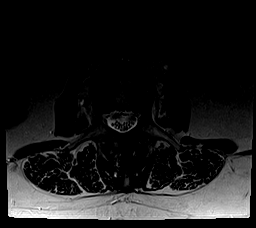
[im 23/38]
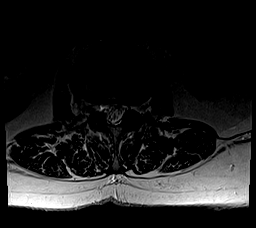
[im 28/38]
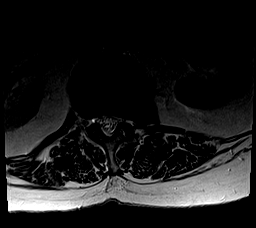
[im 33/38]
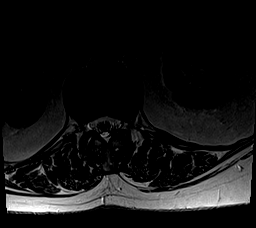
[im 38/38]
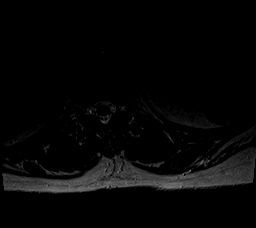

[25 of 48 positions shown; findings below may reference images not displayed]

FINDINGS: Segmentation:  Normal

Alignment:  Mild retrolisthesis L1-2.  Slight anterolisthesis L3-4.

Vertebrae: Negative for fracture or mass. Discogenic edema on the
right at L2-3.

Conus medullaris and cauda equina: Conus extends to the L1-2 level.
Conus and cauda equina appear normal.

Paraspinal and other soft tissues: Negative for paraspinous mass or
adenopathy.

Disc levels:

T12-L1: Mild disc bulging and facet degeneration. Negative for
stenosis

L1-2: Advanced disc degeneration with disc space narrowing and
spurring which is asymmetric on the left. Moderate subarticular
stenosis on the left with mild spinal stenosis

L2-3: Asymmetric disc degeneration on the right with disc space
narrowing and spurring on the right. Diffuse endplate spurring and
bilateral facet hypertrophy. Moderate spinal stenosis and moderate
subarticular stenosis bilaterally.

L3-4: Moderate to severe spinal stenosis due to disc bulging and
facet hypertrophy. Moderate subarticular stenosis bilaterally.

L4-5: Moderate spinal stenosis due to disc bulging and spurring as
well as bilateral facet hypertrophy. Moderate subarticular stenosis
bilaterally.

L5-S1: Advanced facet degeneration bilaterally causing moderate
subarticular stenosis on the left and mild subarticular stenosis on
the right.
IMPRESSION: Extensive multilevel degenerative change throughout the lumbar spine
with spinal and foraminal stenosis as above.

Moderate spinal stenosis L2-3 and moderate to severe spinal stenosis
at L3-4. Moderate spinal stenosis L4-5.

ADDENDUM:
Prior study now available for comparison from 08/13/2017.

Progressive extraforaminal disc and osteophyte complex at L1-2 which
could affect the left L1 nerve root.

Degenerative changes in the remainder of the lumbar spine similar to
the prior MRI.

*** End of Addendum ***
FINDINGS: Segmentation:  Normal

Alignment:  Mild retrolisthesis L1-2.  Slight anterolisthesis L3-4.

Vertebrae: Negative for fracture or mass. Discogenic edema on the
right at L2-3.

Conus medullaris and cauda equina: Conus extends to the L1-2 level.
Conus and cauda equina appear normal.

Paraspinal and other soft tissues: Negative for paraspinous mass or
adenopathy.

Disc levels:

T12-L1: Mild disc bulging and facet degeneration. Negative for
stenosis

L1-2: Advanced disc degeneration with disc space narrowing and
spurring which is asymmetric on the left. Moderate subarticular
stenosis on the left with mild spinal stenosis

L2-3: Asymmetric disc degeneration on the right with disc space
narrowing and spurring on the right. Diffuse endplate spurring and
bilateral facet hypertrophy. Moderate spinal stenosis and moderate
subarticular stenosis bilaterally.

L3-4: Moderate to severe spinal stenosis due to disc bulging and
facet hypertrophy. Moderate subarticular stenosis bilaterally.

L4-5: Moderate spinal stenosis due to disc bulging and spurring as
well as bilateral facet hypertrophy. Moderate subarticular stenosis
bilaterally.

L5-S1: Advanced facet degeneration bilaterally causing moderate
subarticular stenosis on the left and mild subarticular stenosis on
the right.
IMPRESSION: Extensive multilevel degenerative change throughout the lumbar spine
with spinal and foraminal stenosis as above.

Moderate spinal stenosis L2-3 and moderate to severe spinal stenosis
at L3-4. Moderate spinal stenosis L4-5.

## 2020-06-19 DIAGNOSIS — I10 Essential (primary) hypertension: Secondary | ICD-10-CM | POA: Diagnosis not present

## 2020-06-19 DIAGNOSIS — M47816 Spondylosis without myelopathy or radiculopathy, lumbar region: Secondary | ICD-10-CM | POA: Diagnosis not present

## 2020-06-19 DIAGNOSIS — E039 Hypothyroidism, unspecified: Secondary | ICD-10-CM | POA: Diagnosis not present

## 2020-06-23 ENCOUNTER — Other Ambulatory Visit: Payer: Self-pay | Admitting: Cardiology

## 2020-06-24 NOTE — Telephone Encounter (Signed)
Prescription refill request for Xarelto received.  Indication: PAF Last office visit: 03/29/20 Weight: 86.6kg Age: 75 Scr: 0.89 on 03/26/20 CrCl: 74.67  Based on the above findings Xarelto 20mg  daily is the appropriate dose.  Refill approved.

## 2020-07-17 ENCOUNTER — Other Ambulatory Visit: Payer: Self-pay | Admitting: Internal Medicine

## 2020-07-17 DIAGNOSIS — I4819 Other persistent atrial fibrillation: Secondary | ICD-10-CM

## 2020-07-17 DIAGNOSIS — I428 Other cardiomyopathies: Secondary | ICD-10-CM

## 2020-07-23 DIAGNOSIS — M810 Age-related osteoporosis without current pathological fracture: Secondary | ICD-10-CM | POA: Diagnosis not present

## 2020-07-23 DIAGNOSIS — Z780 Asymptomatic menopausal state: Secondary | ICD-10-CM | POA: Diagnosis not present

## 2020-08-14 ENCOUNTER — Ambulatory Visit (INDEPENDENT_AMBULATORY_CARE_PROVIDER_SITE_OTHER): Payer: Medicare Other

## 2020-08-14 DIAGNOSIS — I428 Other cardiomyopathies: Secondary | ICD-10-CM

## 2020-08-14 DIAGNOSIS — I4819 Other persistent atrial fibrillation: Secondary | ICD-10-CM | POA: Diagnosis not present

## 2020-08-14 LAB — ECHOCARDIOGRAM COMPLETE
AV Vena cont: 0.26 cm
Area-P 1/2: 2.56 cm2
Calc EF: 63.8 %
MV M vel: 4.72 m/s
MV Peak grad: 89.2 mmHg
P 1/2 time: 1584 msec
Radius: 0.25 cm
S' Lateral: 2.92 cm
Single Plane A2C EF: 66.9 %
Single Plane A4C EF: 60.8 %

## 2020-08-16 ENCOUNTER — Encounter: Payer: Medicare Other | Admitting: Internal Medicine

## 2020-08-23 ENCOUNTER — Ambulatory Visit: Payer: Medicare Other | Admitting: Internal Medicine

## 2020-08-23 ENCOUNTER — Encounter: Payer: Self-pay | Admitting: Internal Medicine

## 2020-08-23 ENCOUNTER — Other Ambulatory Visit: Payer: Self-pay

## 2020-08-23 VITALS — BP 148/80 | HR 65 | Ht 63.0 in | Wt 186.4 lb

## 2020-08-23 DIAGNOSIS — I4819 Other persistent atrial fibrillation: Secondary | ICD-10-CM

## 2020-08-23 DIAGNOSIS — I10 Essential (primary) hypertension: Secondary | ICD-10-CM | POA: Diagnosis not present

## 2020-08-23 DIAGNOSIS — I25119 Atherosclerotic heart disease of native coronary artery with unspecified angina pectoris: Secondary | ICD-10-CM

## 2020-08-23 MED ORDER — RIVAROXABAN 20 MG PO TABS: 20.0000 mg | ORAL_TABLET | Freq: Every day | ORAL | 0 refills | Status: DC

## 2020-08-23 NOTE — Progress Notes (Signed)
PCP: Denny Levy, PA Primary Cardiologist: Dr Domenic Polite Primary EP: Dr [name]  [name] Fry is a 75 y.o. female who presents today for routine electrophysiology followup.  Since last being seen in our clinic, the patient reports doing very well.  Today, she denies symptoms of palpitations, chest pain, shortness of breath,  lower extremity edema, dizziness, presyncope, or syncope.  The patient is otherwise without complaint today.   Past Medical History:  Diagnosis Date   Arthritis    Atrial fibrillation [facility])    Documented January 2017   Breast cancer [facility])    Left   Cataracts, bilateral    Coronary atherosclerosis of native coronary artery    DES OM 3/12   Essential hypertension    GERD (gastroesophageal reflux disease)    H/O hiatal hernia    Hyperlipidemia    Hypothyroidism    Sleep apnea    Past Surgical History:  Procedure Laterality Date   25 GAUGE PARS PLANA VITRECTOMY WITH 20 GAUGE MVR PORT Right 09/29/2012   Procedure: 25 GAUGE PARS PLANA VITRECTOMY WITH 20 GAUGE MVR PORT;  Surgeon: Hayden Pedro, MD;  Location: [place];  Service: Ophthalmology;  Laterality: Right;   APPENDECTOMY     CARDIAC CATHETERIZATION N/A 07/26/2015   Procedure: Left Heart Cath and Coronary Angiography;  Surgeon: [name], MD;  Location: [place] CV LAB;  Service: Cardiovascular;  Laterality: N/A;   CARDIOVERSION N/A 08/30/2015   Procedure: CARDIOVERSION;  Surgeon: Satira Sark, MD;  [facility];  Service: Cardiovascular;  Laterality: N/A;   CATARACT EXTRACTION W/PHACO Right 05/18/2013   Procedure: CATARACT EXTRACTION PHACO AND INTRAOCULAR LENS PLACEMENT RIGHT EYE;  Surgeon: Tonny [place], MD;  Location: [facility] ORS;  Service: Ophthalmology;  Laterality: Right;  CDE:13.35   CATARACT EXTRACTION W/PHACO Left 02/10/2016   Procedure: CATARACT EXTRACTION PHACO AND INTRAOCULAR LENS PLACEMENT LEFT EYE: CDE:  5.39;  Surgeon: Tonny [place], MD;  Location: [facility];  Service: Ophthalmology;   Laterality: Left;   CHOLECYSTECTOMY     COLONOSCOPY N/A 05/02/2018   Procedure: COLONOSCOPY;  Surgeon: Rogene Houston, MD;  [facility];  Service: Endoscopy;  Laterality: N/A;  1:15   ELECTROPHYSIOLOGIC STUDY N/A 11/19/2015   Procedure: Atrial Fibrillation Ablation;  Surgeon: Thompson Grayer, MD;  Location: [place] CV LAB;  Service: Cardiovascular;  Laterality: N/A;   GAS/FLUID EXCHANGE Right 09/29/2012   Procedure: GAS/FLUID EXCHANGE;  Surgeon: Hayden Pedro, MD;  Location: [place];  Service: Ophthalmology;  Laterality: Right;   LASER PHOTO ABLATION Right 09/29/2012   Procedure: LASER PHOTO ABLATION;  Surgeon: Hayden Pedro, MD;  Location: [place];  Service: Ophthalmology;  Laterality: Right;  Headscope laser   MASTECTOMY Left    1997; S/P chemotherapy/radiation   MEMBRANE PEEL Right 09/29/2012   Procedure: MEMBRANE PEEL;  Surgeon: Hayden Pedro, MD;  Location: [place];  Service: Ophthalmology;  Laterality: Right;   POLYPECTOMY  05/02/2018   Procedure: POLYPECTOMY;  Surgeon: Rogene Houston, MD;  [facility];  Service: Endoscopy;;  sigmoid colon times two, transverse colon times one   TEE WITHOUT CARDIOVERSION N/A 11/19/2015   Procedure: TRANSESOPHAGEAL ECHOCARDIOGRAM (TEE);  Surgeon: Thayer Headings, MD;  Location: [facility] ENDOSCOPY;  Service: Cardiovascular;  Laterality: N/A;   TUBAL LIGATION      ROS- all systems are reviewed and negatives except as per HPI above  Current Outpatient Medications  Medication Sig Dispense Refill   acetaminophen (TYLENOL) 500 MG tablet Take 500-1,000  mg by mouth every 6 (six) hours as needed for moderate pain or headache.     cyclobenzaprine (FLEXERIL) 5 MG tablet Take 5 mg by mouth at bedtime.     furosemide (LASIX) 40 MG tablet Take 1 tablet (40 mg total) by mouth daily as needed for fluid (or weight gain). 30 tablet 2   hydrochlorothiazide (HYDRODIURIL) 12.5 MG tablet TAKE 1 TABLET BY MOUTH DAILY 90 tablet 2   levothyroxine (SYNTHROID,  LEVOTHROID) 125 MCG tablet Take 125 mcg by mouth daily before breakfast.   3   losartan (COZAAR) 100 MG tablet TAKE 1 TABLET BY MOUTH DAILY 90 tablet 2   metoprolol succinate (TOPROL-XL) 25 MG 24 hr tablet TAKE 1 TABLET BY MOUTH EVERY DAY 90 tablet 3   Multiple Vitamins-Minerals (OCUVITE PRESERVISION PO) Take 1 tablet by mouth 2 (two) times daily.      NITROSTAT 0.4 MG SL tablet PLACE 1 TABLET UNDER THE TONGUE EVERY 5 MINUTES FOR 3 DOSES AS NEEDED CHEST PAIN 25 tablet 3   traMADol (ULTRAM) 50 MG tablet Take 50 mg by mouth 2 (two) times daily.     XARELTO 20 MG TABS tablet TAKE 1 TABLET BY MOUTH EVERY DAY WITH SUPPER 30 tablet 6   No current facility-administered medications for this visit.    Physical Exam: Vitals:   08/23/20 1318  BP: (!) 148/80  Pulse: 65  SpO2: 96%  Weight: 186 lb 6.4 oz (84.6 kg)  Height: 5\' 3"  (1.6 m)     GEN- The patient is well appearing, alert and oriented x 3 today.   Head- normocephalic, atraumatic Eyes-  Sclera clear, conjunctiva pink Ears- hearing intact Oropharynx- clear Lungs- Clear to ausculation bilaterally, normal work of breathing Heart- Regular rate and rhythm, no murmurs, rubs or gallops, PMI not laterally displaced GI- soft, NT, ND, + BS Extremities- no clubbing, cyanosis, or edema  Wt Readings from Last 3 Encounters:  08/23/20 186 lb 6.4 oz (84.6 kg)  03/29/20 191 lb (86.6 kg)  09/08/19 198 lb (89.8 kg)   Echo 08/14/20- EF 60%  EKG tracing ordered today is personally reviewed and shows sinus with LBBB  Assessment and Plan:  Persistent afib She has done well post ablation off AAD therapy Chads2vasc score is 5.  She is on xarelto She would like to stop [place] due to costs.  I discussed watchman as an option.  She would prefer to continue xarelto for now.  2. HTN Stable No change required today  3. CAD No ischemic symptoms  4. Nonischemic CM Resolved with sinus   Return in a year  Thompson Grayer MD, [facility] 08/23/2020 1:31  PM

## 2020-08-23 NOTE — Patient Instructions (Signed)
Medication Instructions:  Continue all current medications.  Labwork: none  Testing/Procedures: none  Follow-Up: 1 year   Any Other Special Instructions Will Be Listed Below (If Applicable).  If you need a refill on your cardiac medications before your next appointment, please call your pharmacy.  

## 2020-09-17 DIAGNOSIS — M47816 Spondylosis without myelopathy or radiculopathy, lumbar region: Secondary | ICD-10-CM | POA: Diagnosis not present

## 2020-09-17 DIAGNOSIS — I10 Essential (primary) hypertension: Secondary | ICD-10-CM | POA: Diagnosis not present

## 2020-09-17 DIAGNOSIS — E039 Hypothyroidism, unspecified: Secondary | ICD-10-CM | POA: Diagnosis not present

## 2020-09-21 ENCOUNTER — Other Ambulatory Visit: Payer: Self-pay | Admitting: Interventional Cardiology

## 2020-09-21 ENCOUNTER — Other Ambulatory Visit: Payer: Self-pay | Admitting: Cardiology

## 2020-10-07 ENCOUNTER — Encounter: Payer: Self-pay | Admitting: Cardiology

## 2020-10-07 ENCOUNTER — Other Ambulatory Visit: Payer: Self-pay

## 2020-10-07 ENCOUNTER — Ambulatory Visit: Payer: Medicare Other | Admitting: Cardiology

## 2020-10-07 VITALS — BP 150/70 | HR 63 | Ht 63.0 in | Wt 182.0 lb

## 2020-10-07 DIAGNOSIS — I25119 Atherosclerotic heart disease of native coronary artery with unspecified angina pectoris: Secondary | ICD-10-CM

## 2020-10-07 DIAGNOSIS — M791 Myalgia, unspecified site: Secondary | ICD-10-CM

## 2020-10-07 DIAGNOSIS — I4819 Other persistent atrial fibrillation: Secondary | ICD-10-CM

## 2020-10-07 DIAGNOSIS — T466X5A Adverse effect of antihyperlipidemic and antiarteriosclerotic drugs, initial encounter: Secondary | ICD-10-CM | POA: Diagnosis not present

## 2020-10-07 NOTE — Patient Instructions (Addendum)

## 2020-10-07 NOTE — Progress Notes (Signed)
Cardiology Office Note  Date: 10/07/2020   ID: Vonetta, Bole October 07, 1945, MRN [id number]  PCP:  Denny Levy, PA  Cardiologist:  Rozann Lesches, MD Electrophysiologist:  Thompson Grayer, MD   Chief Complaint  Patient presents with   Cardiac follow-up    History of Present Illness: [name] Fry is a 75 y.o. female last seen in January.  She is here for a follow-up visit.  Reports no exertional chest pain or palpitations.  Remains functional with ADLs and states that she has been compliant with her medications.  She continues to follow with Dr. [name], history of atrial fibrillation ablation.  I reviewed the recent note from June.  Recent follow-up echocardiogram in June revealed LVEF 60 to 65%, normal RV contraction, as well as mild mitral and aortic regurgitation.  We discussed these results today.  I reviewed her medications, no spontaneous bleeding problems reported on Xarelto.  She will have lab work with her PCP at next visit.  Past Medical History:  Diagnosis Date   Arthritis    Atrial fibrillation [facility])    Documented January 2017   Breast cancer [facility])    Left   Cataracts, bilateral    Coronary atherosclerosis of native coronary artery    DES OM 3/12   Essential hypertension    GERD (gastroesophageal reflux disease)    H/O hiatal hernia    Hyperlipidemia    Hypothyroidism    Sleep apnea     Past Surgical History:  Procedure Laterality Date   25 GAUGE PARS PLANA VITRECTOMY WITH 20 GAUGE MVR PORT Right 09/29/2012   Procedure: 25 GAUGE PARS PLANA VITRECTOMY WITH 20 GAUGE MVR PORT;  Surgeon: Hayden Pedro, MD;  Location: [place];  Service: Ophthalmology;  Laterality: Right;   APPENDECTOMY     CARDIAC CATHETERIZATION N/A 07/26/2015   Procedure: Left Heart Cath and Coronary Angiography;  Surgeon: [name], MD;  Location: [place] CV LAB;  Service: Cardiovascular;  Laterality: N/A;   CARDIOVERSION N/A 08/30/2015   Procedure: CARDIOVERSION;  Surgeon: Satira Sark, MD;  [facility];  Service: Cardiovascular;  Laterality: N/A;   CATARACT EXTRACTION W/PHACO Right 05/18/2013   Procedure: CATARACT EXTRACTION PHACO AND INTRAOCULAR LENS PLACEMENT RIGHT EYE;  Surgeon: Tonny Branch, MD;  [facility];  Service: Ophthalmology;  Laterality: Right;  CDE:13.35   CATARACT EXTRACTION W/PHACO Left 02/10/2016   Procedure: CATARACT EXTRACTION PHACO AND INTRAOCULAR LENS PLACEMENT LEFT EYE: CDE:  5.39;  Surgeon: Tonny Branch, MD;  [facility];  Service: Ophthalmology;  Laterality: Left;   CHOLECYSTECTOMY     COLONOSCOPY N/A 05/02/2018   Procedure: COLONOSCOPY;  Surgeon: Rogene Houston, MD;  [facility];  Service: Endoscopy;  Laterality: N/A;  1:15   ELECTROPHYSIOLOGIC STUDY N/A 11/19/2015   Procedure: Atrial Fibrillation Ablation;  Surgeon: Thompson Grayer, MD;  Location: [place] CV LAB;  Service: Cardiovascular;  Laterality: N/A;   GAS/FLUID EXCHANGE Right 09/29/2012   Procedure: GAS/FLUID EXCHANGE;  Surgeon: Hayden Pedro, MD;  Location: [place];  Service: Ophthalmology;  Laterality: Right;   LASER PHOTO ABLATION Right 09/29/2012   Procedure: LASER PHOTO ABLATION;  Surgeon: Hayden Pedro, MD;  Location: [place];  Service: Ophthalmology;  Laterality: Right;  Headscope laser   MASTECTOMY Left    1997; S/P chemotherapy/radiation   MEMBRANE PEEL Right 09/29/2012   Procedure: MEMBRANE PEEL;  Surgeon: Hayden Pedro, MD;  Location: [place];  Service: Ophthalmology;  Laterality: Right;  POLYPECTOMY  05/02/2018   Procedure: POLYPECTOMY;  Surgeon: Rogene Houston, MD;  [facility];  Service: Endoscopy;;  sigmoid colon times two, transverse colon times one   TEE WITHOUT CARDIOVERSION N/A 11/19/2015   Procedure: TRANSESOPHAGEAL ECHOCARDIOGRAM (TEE);  Surgeon: Thayer Headings, MD;  Location: [facility] ENDOSCOPY;  Service: Cardiovascular;  Laterality: N/A;   TUBAL LIGATION      Current Outpatient Medications  Medication Sig Dispense Refill    acetaminophen (TYLENOL) 500 MG tablet Take 500-1,000 mg by mouth every 6 (six) hours as needed for moderate pain or headache.     cyclobenzaprine (FLEXERIL) 5 MG tablet Take 5 mg by mouth at bedtime.     furosemide (LASIX) 40 MG tablet Take 1 tablet (40 mg total) by mouth daily as needed for fluid (or weight gain). 30 tablet 2   hydrochlorothiazide (HYDRODIURIL) 12.5 MG tablet TAKE 1 TABLET BY MOUTH DAILY 90 tablet 2   levothyroxine (SYNTHROID, LEVOTHROID) 125 MCG tablet Take 125 mcg by mouth daily before breakfast.   3   losartan (COZAAR) 100 MG tablet TAKE 1 TABLET BY MOUTH DAILY 90 tablet 2   metoprolol succinate (TOPROL-XL) 25 MG 24 hr tablet TAKE 1 TABLET BY MOUTH EVERY DAY 90 tablet 3   Multiple Vitamins-Minerals (OCUVITE PRESERVISION PO) Take 1 tablet by mouth 2 (two) times daily.      NITROSTAT 0.4 MG SL tablet PLACE 1 TABLET UNDER THE TONGUE EVERY 5 MINUTES FOR 3 DOSES AS NEEDED CHEST PAIN 25 tablet 3   rivaroxaban (XARELTO) 20 MG TABS tablet Take 1 tablet (20 mg total) by mouth daily with supper. 21 tablet 0   traMADol (ULTRAM) 50 MG tablet Take 50 mg by mouth 2 (two) times daily.     No current facility-administered medications for this visit.   Allergies:  Doxycycline   ROS: No palpitations or syncope.  Physical Exam: VS:  BP (!) 150/70   Pulse 63   Ht '5\' 3"'$  (1.6 m)   Wt 182 lb (82.6 kg)   LMP 09/28/2012   SpO2 100%   BMI 32.24 kg/m , BMI Body mass index is 32.24 kg/m.  Wt Readings from Last 3 Encounters:  10/07/20 182 lb (82.6 kg)  08/23/20 186 lb 6.4 oz (84.6 kg)  03/29/20 191 lb (86.6 kg)    General: Patient appears comfortable at rest. HEENT: Conjunctiva and lids normal, wearing a mask. Neck: Supple, no elevated JVP or carotid bruits, no thyromegaly. Lungs: Clear to auscultation, nonlabored breathing at rest. Cardiac: Regular rate and rhythm, no S3 or significant systolic murmur, no pericardial rub.  ECG:  An ECG dated 08/23/2020 was personally reviewed today  and demonstrated:  Sinus rhythm with left bundle branch block.  Recent Labwork:  January 2022: BUN 14, creatinine 0.89, potassium 4.2, AST 19, ALT 13, TSH 2.52, cholesterol 288, triglycerides 446, HDL 30, LDL 154  Other Studies Reviewed Today:  Echocardiogram 08/14/2020:  1. Left ventricular ejection fraction, by estimation, is 60 to 65%. The  left ventricle has normal function. The left ventricle has no regional  wall motion abnormalities. Normal global lonigtudinal strain of -17.9%.   2. Right ventricular systolic function is normal. The right ventricular  size is normal. There is normal pulmonary artery systolic pressure. The  estimated right ventricular systolic pressure is 99991111 mmHg.   3. The mitral valve is grossly normal. Mild mitral valve regurgitation.   4. The aortic valve is tricuspid. Aortic valve regurgitation is mild.  Aortic regurgitation PHT measures 1584  msec.   5. The inferior vena cava is normal in size with greater than 50%  respiratory variability, suggesting right atrial pressure of 3 mmHg.  Assessment and Plan:  1.  Persistent atrial fibrillation status post successful atrial fibrillation ablation and maintaining sinus rhythm.  CHA2DS2-VASc score is 5, she remains on Xarelto for stroke prophylaxis, also Toprol-XL.  Follow-up lab work with PCP at next visit.  2.  CAD status post DES to obtuse marginal in 2012.  She does not report any active angina.  LVEF normal at 60 to 65% by recent follow-up echocardiogram.  3.  Mixed hyperlipidemia with intolerance to statins and Zetia.  She has preferred to hold off on PCSK9 inhibitor.  Last LDL 154.  Medication Adjustments/Labs and Tests Ordered: Current medicines are reviewed at length with the patient today.  Concerns regarding medicines are outlined above.   Tests Ordered: No orders of the defined types were placed in this encounter.   Medication Changes: No orders of the defined types were placed in this  encounter.   Disposition:  Follow up  6 months.  Signed, Satira Sark, MD, [facility] 10/07/2020 1:26 PM    [facility], [place], [place] [address] Phone: [phone]; Fax: [phone]

## 2020-10-17 DIAGNOSIS — H40019 Open angle with borderline findings, low risk, unspecified eye: Secondary | ICD-10-CM | POA: Diagnosis not present

## 2020-10-17 DIAGNOSIS — H524 Presbyopia: Secondary | ICD-10-CM | POA: Diagnosis not present

## 2020-12-17 DIAGNOSIS — E7800 Pure hypercholesterolemia, unspecified: Secondary | ICD-10-CM | POA: Diagnosis not present

## 2020-12-17 DIAGNOSIS — I10 Essential (primary) hypertension: Secondary | ICD-10-CM | POA: Diagnosis not present

## 2020-12-17 DIAGNOSIS — M47816 Spondylosis without myelopathy or radiculopathy, lumbar region: Secondary | ICD-10-CM | POA: Diagnosis not present

## 2020-12-17 DIAGNOSIS — E039 Hypothyroidism, unspecified: Secondary | ICD-10-CM | POA: Diagnosis not present

## 2020-12-17 DIAGNOSIS — Z0000 Encounter for general adult medical examination without abnormal findings: Secondary | ICD-10-CM | POA: Diagnosis not present

## 2021-01-10 DIAGNOSIS — E7849 Other hyperlipidemia: Secondary | ICD-10-CM | POA: Diagnosis not present

## 2021-03-18 DIAGNOSIS — I10 Essential (primary) hypertension: Secondary | ICD-10-CM | POA: Diagnosis not present

## 2021-03-18 DIAGNOSIS — M47816 Spondylosis without myelopathy or radiculopathy, lumbar region: Secondary | ICD-10-CM | POA: Diagnosis not present

## 2021-03-18 DIAGNOSIS — E039 Hypothyroidism, unspecified: Secondary | ICD-10-CM | POA: Diagnosis not present

## 2021-03-20 DIAGNOSIS — H401112 Primary open-angle glaucoma, right eye, moderate stage: Secondary | ICD-10-CM | POA: Diagnosis not present

## 2021-04-07 ENCOUNTER — Encounter: Payer: Self-pay | Admitting: Cardiology

## 2021-04-07 DIAGNOSIS — I10 Essential (primary) hypertension: Secondary | ICD-10-CM | POA: Diagnosis not present

## 2021-04-07 DIAGNOSIS — E039 Hypothyroidism, unspecified: Secondary | ICD-10-CM | POA: Diagnosis not present

## 2021-04-07 DIAGNOSIS — R002 Palpitations: Secondary | ICD-10-CM | POA: Diagnosis not present

## 2021-04-07 DIAGNOSIS — R5383 Other fatigue: Secondary | ICD-10-CM | POA: Diagnosis not present

## 2021-04-23 DIAGNOSIS — K449 Diaphragmatic hernia without obstruction or gangrene: Secondary | ICD-10-CM | POA: Diagnosis not present

## 2021-04-23 DIAGNOSIS — I10 Essential (primary) hypertension: Secondary | ICD-10-CM | POA: Diagnosis not present

## 2021-04-24 DIAGNOSIS — Z1231 Encounter for screening mammogram for malignant neoplasm of breast: Secondary | ICD-10-CM | POA: Diagnosis not present

## 2021-05-01 DIAGNOSIS — H401112 Primary open-angle glaucoma, right eye, moderate stage: Secondary | ICD-10-CM | POA: Diagnosis not present

## 2021-05-22 ENCOUNTER — Other Ambulatory Visit: Payer: Self-pay | Admitting: Cardiology

## 2021-05-22 NOTE — Telephone Encounter (Signed)
Prescription refill request for Xarelto received.  ?Indication: Atrial fib ?Last office visit: 10/07/20  Myles Gip MD ?Weight: 82.6kg ?Age: 76 ?Scr: 0.82 on 04/07/21 ?CrCl: 76.11 ? ?Based on above findings Xarelto '20mg'$  daily is the appropriate dose.  Refill approved. ? ?

## 2021-06-12 DIAGNOSIS — E7800 Pure hypercholesterolemia, unspecified: Secondary | ICD-10-CM | POA: Diagnosis not present

## 2021-06-12 DIAGNOSIS — E782 Mixed hyperlipidemia: Secondary | ICD-10-CM | POA: Diagnosis not present

## 2021-06-12 DIAGNOSIS — E7849 Other hyperlipidemia: Secondary | ICD-10-CM | POA: Diagnosis not present

## 2021-06-12 DIAGNOSIS — E039 Hypothyroidism, unspecified: Secondary | ICD-10-CM | POA: Diagnosis not present

## 2021-06-12 DIAGNOSIS — I10 Essential (primary) hypertension: Secondary | ICD-10-CM | POA: Diagnosis not present

## 2021-06-16 DIAGNOSIS — E7849 Other hyperlipidemia: Secondary | ICD-10-CM | POA: Diagnosis not present

## 2021-06-16 DIAGNOSIS — M47816 Spondylosis without myelopathy or radiculopathy, lumbar region: Secondary | ICD-10-CM | POA: Diagnosis not present

## 2021-06-16 DIAGNOSIS — I10 Essential (primary) hypertension: Secondary | ICD-10-CM | POA: Diagnosis not present

## 2021-06-17 DIAGNOSIS — H40013 Open angle with borderline findings, low risk, bilateral: Secondary | ICD-10-CM | POA: Diagnosis not present

## 2021-06-19 ENCOUNTER — Other Ambulatory Visit: Payer: Self-pay | Admitting: Cardiology

## 2021-07-03 ENCOUNTER — Encounter: Payer: Self-pay | Admitting: Cardiology

## 2021-07-03 ENCOUNTER — Ambulatory Visit: Payer: Medicare Other | Admitting: Cardiology

## 2021-07-03 VITALS — BP 140/84 | HR 68 | Ht 63.5 in | Wt 181.8 lb

## 2021-07-03 DIAGNOSIS — I4819 Other persistent atrial fibrillation: Secondary | ICD-10-CM

## 2021-07-03 DIAGNOSIS — I25119 Atherosclerotic heart disease of native coronary artery with unspecified angina pectoris: Secondary | ICD-10-CM

## 2021-07-03 NOTE — Progress Notes (Signed)
? ? ?Cardiology Office Note ? ?Date: 07/03/2021  ? ?ID: [name] Fry, DOB 02/03/1946, MRN [id number] ? ?PCP:  Denny Levy, PA  ?Cardiologist:  Rozann Lesches, MD ?Electrophysiologist:  Thompson Grayer, MD  ? ?Chief Complaint  ?Patient presents with  ? Cardiac follow-up  ? ? ?History of Present Illness: ?[name] Fry is a 76 y.o. female last seen in July 2022.  She is here for a routine visit.  She tells me about some symptoms she was experiencing back in January during which time she was having cough.  Felt like something was "pushing" on her heart, sounds like she possibly had pleurisy based on description of symptoms.  She did have an ECG with her PCP in January which showed sinus rhythm and old left bundle branch block, no changes.  Since that time symptoms have resolved.  She does not describe any palpitations and reports compliance with medications. ? ?I reviewed her lab work from January.  She reports no spontaneous bleeding problems on Xarelto and remains on stable dose of Toprol-XL. ? ?Past Medical History:  ?Diagnosis Date  ? Arthritis   ? Atrial fibrillation ([place])   ? Documented January 2017  ? Breast cancer ([place])   ? Left  ? Cataracts, bilateral   ? Coronary atherosclerosis of native coronary artery   ? DES OM 3/12  ? Essential hypertension   ? GERD (gastroesophageal reflux disease)   ? H/O hiatal hernia   ? Hyperlipidemia   ? Hypothyroidism   ? Sleep apnea   ? ? ?Past Surgical History:  ?Procedure Laterality Date  ? 25 GAUGE PARS PLANA VITRECTOMY WITH 20 GAUGE MVR PORT Right 09/29/2012  ? Procedure: 25 GAUGE PARS PLANA VITRECTOMY WITH 20 GAUGE MVR PORT;  Surgeon: Hayden Pedro, MD;  Location: [place];  Service: Ophthalmology;  Laterality: Right;  ? APPENDECTOMY    ? CARDIAC CATHETERIZATION N/A 07/26/2015  ? Procedure: Left Heart Cath and Coronary Angiography;  Surgeon: [name], MD;  Location: [place] CV LAB;  Service: Cardiovascular;  Laterality: N/A;  ? CARDIOVERSION N/A 08/30/2015  ? Procedure:  CARDIOVERSION;  Surgeon: Satira Sark, MD;  [facility];  Service: Cardiovascular;  Laterality: N/A;  ? CATARACT EXTRACTION W/PHACO Right 05/18/2013  ? Procedure: CATARACT EXTRACTION PHACO AND INTRAOCULAR LENS PLACEMENT RIGHT EYE;  Surgeon: Tonny Branch, MD;  [facility];  Service: Ophthalmology;  Laterality: Right;  CDE:13.35  ? CATARACT EXTRACTION W/PHACO Left 02/10/2016  ? Procedure: CATARACT EXTRACTION PHACO AND INTRAOCULAR LENS PLACEMENT LEFT EYE: CDE:  5.39;  Surgeon: Tonny Branch, MD;  [facility];  Service: Ophthalmology;  Laterality: Left;  ? CHOLECYSTECTOMY    ? COLONOSCOPY N/A 05/02/2018  ? Procedure: COLONOSCOPY;  Surgeon: Rogene Houston, MD;  [facility];  Service: Endoscopy;  Laterality: N/A;  1:15  ? ELECTROPHYSIOLOGIC STUDY N/A 11/19/2015  ? Procedure: Atrial Fibrillation Ablation;  Surgeon: Thompson Grayer, MD;  Location: [place] [facility];  Service: Cardiovascular;  Laterality: N/A;  ? GAS/FLUID EXCHANGE Right 09/29/2012  ? Procedure: GAS/FLUID EXCHANGE;  Surgeon: Hayden Pedro, MD;  Location: [place];  Service: Ophthalmology;  Laterality: Right;  ? LASER PHOTO ABLATION Right 09/29/2012  ? Procedure: LASER PHOTO ABLATION;  Surgeon: Hayden Pedro, MD;  Location: [place];  Service: Ophthalmology;  Laterality: Right;  Headscope laser  ? MASTECTOMY Left   ? 1997; S/P chemotherapy/radiation  ? MEMBRANE PEEL Right 09/29/2012  ? Procedure: MEMBRANE PEEL;  Surgeon: Hayden Pedro, MD;  Location: [place] [facility];  Service: Ophthalmology;  Laterality: Right;  ? POLYPECTOMY  05/02/2018  ? Procedure: POLYPECTOMY;  Surgeon: Rogene Houston, MD;  [facility];  Service: Endoscopy;;  sigmoid colon times two, transverse colon times one  ? TEE WITHOUT CARDIOVERSION N/A 11/19/2015  ? Procedure: TRANSESOPHAGEAL ECHOCARDIOGRAM (TEE);  Surgeon: Thayer Headings, MD;  Location: [place];  Service: Cardiovascular;  Laterality: N/A;  ? TUBAL LIGATION    ? ? ?Current Outpatient Medications   ?Medication Sig Dispense Refill  ? acetaminophen (TYLENOL) 500 MG tablet Take 500-1,000 mg by mouth every 6 (six) hours as needed for moderate pain or headache.    ? cyclobenzaprine (FLEXERIL) 5 MG tablet Take 5 mg by mouth as needed.    ? furosemide (LASIX) 40 MG tablet Take 1 tablet (40 mg total) by mouth daily as needed for fluid (or weight gain). 30 tablet 2  ? gemfibrozil (LOPID) 600 MG tablet Take 600 mg by mouth 2 (two) times daily.    ? hydrochlorothiazide (HYDRODIURIL) 12.5 MG tablet TAKE 1 TABLET BY MOUTH DAILY 90 tablet 2  ? latanoprost (XALATAN) 0.005 % ophthalmic solution Place 1 drop into both eyes at bedtime.    ? levothyroxine (SYNTHROID, LEVOTHROID) 125 MCG tablet Take 125 mcg by mouth daily before breakfast.   3  ? losartan (COZAAR) 100 MG tablet TAKE 1 TABLET BY MOUTH DAILY (CAMBER BRAND: WHITE TABLETS ONLY) 90 tablet 2  ? LUMIGAN 0.01 % SOLN Place 1 drop into both eyes at bedtime.    ? metoprolol succinate (TOPROL-XL) 25 MG 24 hr tablet TAKE 1 TABLET BY MOUTH EVERY DAY 90 tablet 3  ? Multiple Vitamins-Minerals (OCUVITE PRESERVISION PO) Take 1 tablet by mouth 2 (two) times daily.     ? NITROSTAT 0.4 MG SL tablet PLACE 1 TABLET UNDER THE TONGUE EVERY 5 MINUTES FOR 3 DOSES AS NEEDED CHEST PAIN 25 tablet 3  ? rivaroxaban (XARELTO) 20 MG TABS tablet TAKE 1 TABLET BY MOUTH EVERY DAY WITH SUPPER 30 tablet 11  ? traMADol (ULTRAM) 50 MG tablet Take 50 mg by mouth 2 (two) times daily.    ? ?No current facility-administered medications for this visit.  ? ?Allergies:  Doxycycline  ? ?ROS:  No syncope. ? ?Physical Exam: ?VS:  BP 140/84   Pulse 68   Ht 5' 3.5" (1.613 m)   Wt 181 lb 12.8 oz (82.5 kg)   LMP 09/28/2012   SpO2 97%   BMI 31.70 kg/m? , BMI Body mass index is 31.7 kg/m?. ? ?Wt Readings from Last 3 Encounters:  ?07/03/21 181 lb 12.8 oz (82.5 kg)  ?10/07/20 182 lb (82.6 kg)  ?08/23/20 186 lb 6.4 oz (84.6 kg)  ?  ?General: Patient appears comfortable at rest. ?HEENT: Conjunctiva and lids  normal. ?Neck: Supple, no elevated JVP or carotid bruits, no thyromegaly. ?Lungs: Clear to auscultation, nonlabored breathing at rest. ?Cardiac: Regular rate and rhythm, no S3 or significant systolic murmur, no pericardial rub. ?Extremities: No pitting edema. ? ?ECG:  An ECG dated January 2023 was personally reviewed today and demonstrated:  Sinus rhythm with left bundle branch block. ? ?Recent Labwork: ? ?January 2023: BUN 13, creatinine 0.82, potassium 4.2, AST 18, ALT 10, TSH 0.58, hemoglobin 13.5, platelets 281 ? ?Other Studies Reviewed Today: ? ?Echocardiogram 08/14/2020: ? 1. Left ventricular ejection fraction, by estimation, is 60 to 65%. The  ?left ventricle has normal function. The left ventricle has no regional  ?wall motion abnormalities. Normal global lonigtudinal strain of -  17.9%.  ? 2. Right ventricular systolic function is normal. The right ventricular  ?size is normal. There is normal pulmonary artery systolic pressure. The  ?estimated right ventricular systolic pressure is 05.3 mmHg.  ? 3. The mitral valve is grossly normal. Mild mitral valve regurgitation.  ? 4. The aortic valve is tricuspid. Aortic valve regurgitation is mild.  ?Aortic regurgitation PHT measures 1584 msec.  ? 5. The inferior vena cava is normal in size with greater than 50%  ?respiratory variability, suggesting right atrial pressure of 3 mmHg. ? ?Assessment and Plan: ? ?1.  Persistent atrial fibrillation status post successful atrial fibrillation ablation.  Heart rate is regular and she reports no interval palpitations.  I did review her ECG from January which was stable.  CHA2DS2-VASc score is 5, she continues on Xarelto for stroke prophylaxis, lab work from January reviewed.  Continue Toprol-XL. ? ?2.  CAD status post DES to the obtuse marginal in 2012.  Continuing observation in the absence of angina symptoms.  LVEF 60 to 65% by follow-up last year.  She has a history of statin intolerance as well as intolerance to Zetia, prefers  to hold off on PCSK9 inhibitors.  She is on Lopid. ? ?Medication Adjustments/Labs and Tests Ordered: ?Current medicines are reviewed at length with the patient today.  Concerns regarding medicines are outlined above.

## 2021-07-03 NOTE — Patient Instructions (Addendum)

## 2021-07-07 DIAGNOSIS — L239 Allergic contact dermatitis, unspecified cause: Secondary | ICD-10-CM | POA: Diagnosis not present

## 2021-07-07 DIAGNOSIS — H7291 Unspecified perforation of tympanic membrane, right ear: Secondary | ICD-10-CM | POA: Diagnosis not present

## 2021-07-07 DIAGNOSIS — I10 Essential (primary) hypertension: Secondary | ICD-10-CM | POA: Diagnosis not present

## 2021-07-09 DIAGNOSIS — R21 Rash and other nonspecific skin eruption: Secondary | ICD-10-CM | POA: Diagnosis not present

## 2021-07-09 DIAGNOSIS — I10 Essential (primary) hypertension: Secondary | ICD-10-CM | POA: Diagnosis not present

## 2021-08-15 ENCOUNTER — Encounter: Payer: Medicare Other | Admitting: Internal Medicine

## 2021-08-20 DIAGNOSIS — E7800 Pure hypercholesterolemia, unspecified: Secondary | ICD-10-CM | POA: Diagnosis not present

## 2021-08-20 DIAGNOSIS — E7849 Other hyperlipidemia: Secondary | ICD-10-CM | POA: Diagnosis not present

## 2021-08-20 DIAGNOSIS — E782 Mixed hyperlipidemia: Secondary | ICD-10-CM | POA: Diagnosis not present

## 2021-08-22 ENCOUNTER — Ambulatory Visit: Payer: Medicare Other | Admitting: Internal Medicine

## 2021-08-22 ENCOUNTER — Encounter: Payer: Self-pay | Admitting: Internal Medicine

## 2021-08-22 VITALS — BP 114/69 | HR 61 | Ht 63.5 in | Wt 182.0 lb

## 2021-08-22 DIAGNOSIS — I428 Other cardiomyopathies: Secondary | ICD-10-CM | POA: Diagnosis not present

## 2021-08-22 DIAGNOSIS — I25119 Atherosclerotic heart disease of native coronary artery with unspecified angina pectoris: Secondary | ICD-10-CM

## 2021-08-22 DIAGNOSIS — I10 Essential (primary) hypertension: Secondary | ICD-10-CM

## 2021-08-22 DIAGNOSIS — I4819 Other persistent atrial fibrillation: Secondary | ICD-10-CM

## 2021-08-22 NOTE — Patient Instructions (Signed)
Medication Instructions:  Your physician recommends that you continue on your current medications as directed. Please refer to the Current Medication list given to you today.  *If you need a refill on your cardiac medications before your next appointment, please call your pharmacy*   Lab Work: None ordered.  If you have labs (blood work) drawn today and your tests are completely normal, you will receive your results only by: [place] (if you have MyChart) OR A paper copy in the mail If you have any lab test that is abnormal or we need to change your treatment, we will call you to review the results.   Testing/Procedures: None ordered.    Follow-Up: At [facility], you and your health needs are our priority.  As part of our continuing mission to provide you with exceptional heart care, we have created designated Provider Care Teams.  These Care Teams include your primary Cardiologist (physician) and Advanced Practice Providers (APPs -  Physician Assistants and Nurse Practitioners) who all work together to provide you with the care you need, when you need it.  We recommend signing up for the patient portal called "MyChart".  Sign up information is provided on this After Visit Summary.  MyChart is used to connect with patients for Virtual Visits (Telemedicine).  Patients are able to view lab/test results, encounter notes, upcoming appointments, etc.  Non-urgent messages can be sent to your provider as well.   To learn more about what you can do with MyChart, go to [email or web address].    Your next appointment:   12 months with Dr [name] :1}    Important Information About Sugar

## 2021-08-22 NOTE — Progress Notes (Signed)
PCP: Denny Levy, PA Primary Cardiologist: Dr Domenic Polite Primary EP: Dr [name]  [name] Fry is a 76 y.o. female who presents today for routine electrophysiology follo2wup.  Since last being seen in our clinic, the patient reports doing very well.  Today, she denies symptoms of palpitations, chest pain, shortness of breath,  lower extremity edema, dizziness, presyncope, or syncope.  The patient is otherwise without complaint today.   Past Medical History:  Diagnosis Date   Arthritis    Atrial fibrillation [facility])    Documented January 2017   Breast cancer [facility])    Left   Cataracts, bilateral    Coronary atherosclerosis of native coronary artery    DES OM 3/12   Essential hypertension    GERD (gastroesophageal reflux disease)    H/O hiatal hernia    Hyperlipidemia    Hypothyroidism    Sleep apnea    Past Surgical History:  Procedure Laterality Date   25 GAUGE PARS PLANA VITRECTOMY WITH 20 GAUGE MVR PORT Right 09/29/2012   Procedure: 25 GAUGE PARS PLANA VITRECTOMY WITH 20 GAUGE MVR PORT;  Surgeon: Hayden Pedro, MD;  Location: [place];  Service: Ophthalmology;  Laterality: Right;   APPENDECTOMY     CARDIAC CATHETERIZATION N/A 07/26/2015   Procedure: Left Heart Cath and Coronary Angiography;  Surgeon: [name], MD;  Location: [place] CV LAB;  Service: Cardiovascular;  Laterality: N/A;   CARDIOVERSION N/A 08/30/2015   Procedure: CARDIOVERSION;  Surgeon: Satira Sark, MD;  [facility];  Service: Cardiovascular;  Laterality: N/A;   CATARACT EXTRACTION W/PHACO Right 05/18/2013   Procedure: CATARACT EXTRACTION PHACO AND INTRAOCULAR LENS PLACEMENT RIGHT EYE;  Surgeon: Tonny Branch, MD;  Location: [facility];  Service: Ophthalmology;  Laterality: Right;  CDE:13.35   CATARACT EXTRACTION W/PHACO Left 02/10/2016   Procedure: CATARACT EXTRACTION PHACO AND INTRAOCULAR LENS PLACEMENT LEFT EYE: CDE:  5.39;  Surgeon: Tonny Branch, MD;  Location: [facility];  Service: Ophthalmology;   Laterality: Left;   CHOLECYSTECTOMY     COLONOSCOPY N/A 05/02/2018   Procedure: COLONOSCOPY;  Surgeon: Rogene Houston, MD;  [facility];  Service: Endoscopy;  Laterality: N/A;  1:15   ELECTROPHYSIOLOGIC STUDY N/A 11/19/2015   Procedure: Atrial Fibrillation Ablation;  Surgeon: Thompson Grayer, MD;  Location: [place] CV LAB;  Service: Cardiovascular;  Laterality: N/A;   GAS/FLUID EXCHANGE Right 09/29/2012   Procedure: GAS/FLUID EXCHANGE;  Surgeon: Hayden Pedro, MD;  Location: [place];  Service: Ophthalmology;  Laterality: Right;   LASER PHOTO ABLATION Right 09/29/2012   Procedure: LASER PHOTO ABLATION;  Surgeon: Hayden Pedro, MD;  Location: [place];  Service: Ophthalmology;  Laterality: Right;  Headscope laser   MASTECTOMY Left    1997; S/P chemotherapy/radiation   MEMBRANE PEEL Right 09/29/2012   Procedure: MEMBRANE PEEL;  Surgeon: Hayden Pedro, MD;  Location: [place];  Service: Ophthalmology;  Laterality: Right;   POLYPECTOMY  05/02/2018   Procedure: POLYPECTOMY;  Surgeon: Rogene Houston, MD;  [facility];  Service: Endoscopy;;  sigmoid colon times two, transverse colon times one   TEE WITHOUT CARDIOVERSION N/A 11/19/2015   Procedure: TRANSESOPHAGEAL ECHOCARDIOGRAM (TEE);  Surgeon: Thayer Headings, MD;  Location: [facility] ENDOSCOPY;  Service: Cardiovascular;  Laterality: N/A;   TUBAL LIGATION      ROS- all systems are reviewed and negatives except as per HPI above  Current Outpatient Medications  Medication Sig Dispense Refill   acetaminophen (TYLENOL) 500 MG tablet Take 500-1,000  mg by mouth every 6 (six) hours as needed for moderate pain or headache.     cyclobenzaprine (FLEXERIL) 5 MG tablet Take 5 mg by mouth as needed.     furosemide (LASIX) 40 MG tablet Take 1 tablet (40 mg total) by mouth daily as needed for fluid (or weight gain). 30 tablet 2   gemfibrozil (LOPID) 600 MG tablet Take 600 mg by mouth 2 (two) times daily.     hydrochlorothiazide (HYDRODIURIL) 12.5  MG tablet TAKE 1 TABLET BY MOUTH DAILY 90 tablet 2   latanoprost (XALATAN) 0.005 % ophthalmic solution Place 1 drop into both eyes at bedtime.     levothyroxine (SYNTHROID, LEVOTHROID) 125 MCG tablet Take 125 mcg by mouth daily before breakfast.   3   losartan (COZAAR) 100 MG tablet TAKE 1 TABLET BY MOUTH DAILY (CAMBER BRAND: WHITE TABLETS ONLY) 90 tablet 2   metoprolol succinate (TOPROL-XL) 25 MG 24 hr tablet TAKE 1 TABLET BY MOUTH EVERY DAY 90 tablet 3   Multiple Vitamins-Minerals (OCUVITE PRESERVISION PO) Take 1 tablet by mouth 2 (two) times daily.      NITROSTAT 0.4 MG SL tablet PLACE 1 TABLET UNDER THE TONGUE EVERY 5 MINUTES FOR 3 DOSES AS NEEDED CHEST PAIN 25 tablet 3   rivaroxaban (XARELTO) 20 MG TABS tablet TAKE 1 TABLET BY MOUTH EVERY DAY WITH SUPPER 30 tablet 11   traMADol (ULTRAM) 50 MG tablet Take 50 mg by mouth 2 (two) times daily.     No current facility-administered medications for this visit.    Physical Exam: Vitals:   08/22/21 1013  BP: 114/69  Pulse: 61  SpO2: 96%  Weight: 182 lb (82.6 kg)  Height: 5' 3.5" (1.613 m)    GEN- The patient is well appearing, alert and oriented x 3 today.   Head- normocephalic, atraumatic Eyes-  Sclera clear, conjunctiva pink Ears- hearing intact Oropharynx- clear Lungs- Clear to ausculation bilaterally, normal work of breathing Heart- Regular rate and rhythm, no murmurs, rubs or gallops, PMI not laterally displaced GI- soft, NT, ND, + BS Extremities- no clubbing, cyanosis, or edema  Wt Readings from Last 3 Encounters:  08/22/21 182 lb (82.6 kg)  07/03/21 181 lb 12.8 oz (82.5 kg)  10/07/20 182 lb (82.6 kg)    EKG tracing ordered today is personally reviewed and shows sinus  Assessment and Plan:  Persistent afib Well controlled post ablation off AAD therapy Chads2vasc score is 5.  She is on xarelto She would like to stop [facility] due to costs.  I discussed watchman as an option.  She would prefer to continue xarelto for  now.  2. CAD No ischemic symtoms  3. HTN Stable No change required today Labs 1/23 reviewed  Return in a year  Thompson Grayer MD, [facility] 08/22/2021 10:22 AM

## 2021-08-25 DIAGNOSIS — I10 Essential (primary) hypertension: Secondary | ICD-10-CM | POA: Diagnosis not present

## 2021-08-25 DIAGNOSIS — M47816 Spondylosis without myelopathy or radiculopathy, lumbar region: Secondary | ICD-10-CM | POA: Diagnosis not present

## 2021-08-25 DIAGNOSIS — E7849 Other hyperlipidemia: Secondary | ICD-10-CM | POA: Diagnosis not present

## 2021-09-18 ENCOUNTER — Other Ambulatory Visit: Payer: Self-pay | Admitting: Interventional Cardiology

## 2021-10-23 DIAGNOSIS — H524 Presbyopia: Secondary | ICD-10-CM | POA: Diagnosis not present

## 2021-10-23 DIAGNOSIS — H40013 Open angle with borderline findings, low risk, bilateral: Secondary | ICD-10-CM | POA: Diagnosis not present

## 2021-11-06 DIAGNOSIS — M1712 Unilateral primary osteoarthritis, left knee: Secondary | ICD-10-CM | POA: Diagnosis not present

## 2021-11-06 DIAGNOSIS — M25562 Pain in left knee: Secondary | ICD-10-CM | POA: Diagnosis not present

## 2021-11-06 DIAGNOSIS — L239 Allergic contact dermatitis, unspecified cause: Secondary | ICD-10-CM | POA: Diagnosis not present

## 2021-11-06 DIAGNOSIS — R030 Elevated blood-pressure reading, without diagnosis of hypertension: Secondary | ICD-10-CM | POA: Diagnosis not present

## 2021-11-21 DIAGNOSIS — E7849 Other hyperlipidemia: Secondary | ICD-10-CM | POA: Diagnosis not present

## 2021-11-25 DIAGNOSIS — M47816 Spondylosis without myelopathy or radiculopathy, lumbar region: Secondary | ICD-10-CM | POA: Diagnosis not present

## 2021-11-25 DIAGNOSIS — E7849 Other hyperlipidemia: Secondary | ICD-10-CM | POA: Diagnosis not present

## 2021-11-25 DIAGNOSIS — I10 Essential (primary) hypertension: Secondary | ICD-10-CM | POA: Diagnosis not present

## 2021-12-08 DIAGNOSIS — H401112 Primary open-angle glaucoma, right eye, moderate stage: Secondary | ICD-10-CM | POA: Diagnosis not present

## 2022-01-07 ENCOUNTER — Ambulatory Visit: Payer: Medicare Other | Attending: Cardiology | Admitting: Cardiology

## 2022-01-07 ENCOUNTER — Encounter: Payer: Self-pay | Admitting: Cardiology

## 2022-01-07 VITALS — BP 142/78 | HR 62 | Ht 63.5 in | Wt 187.0 lb

## 2022-01-07 DIAGNOSIS — I4819 Other persistent atrial fibrillation: Secondary | ICD-10-CM

## 2022-01-07 DIAGNOSIS — I25119 Atherosclerotic heart disease of native coronary artery with unspecified angina pectoris: Secondary | ICD-10-CM | POA: Diagnosis not present

## 2022-01-07 MED ORDER — RIVAROXABAN 20 MG PO TABS: ORAL_TABLET | ORAL | 0 refills | Status: DC

## 2022-01-07 NOTE — Patient Instructions (Signed)

## 2022-01-07 NOTE — Progress Notes (Signed)
Cardiology Office Note  Date: 01/07/2022   ID: [name], Fry 11-16-1945, MRN [id number]  PCP:  Denny Levy, PA  Cardiologist:  Rozann Lesches, MD Electrophysiologist:  Thompson Grayer, MD   Chief Complaint  Patient presents with   Cardiac follow-up    History of Present Illness: [name] Fry is a 76 y.o. female last seen in April.  She is here for a follow-up visit.  Reports no progressive sense of palpitations or recurring chest discomfort.  She is mainly limited by chronic back pain.  I reviewed her medications, she remains on Toprol-XL along with Xarelto.  We went over her lab work from January, she will have repeat lab work with PCP later this year.  No spontaneous bleeding problems reported.  Past Medical History:  Diagnosis Date   Arthritis    Atrial fibrillation [facility])    Documented January 2017   Breast cancer [facility])    Left   Cataracts, bilateral    Coronary atherosclerosis of native coronary artery    DES OM 3/12   Essential hypertension    GERD (gastroesophageal reflux disease)    H/O hiatal hernia    Hyperlipidemia    Hypothyroidism    Sleep apnea     Past Surgical History:  Procedure Laterality Date   25 GAUGE PARS PLANA VITRECTOMY WITH 20 GAUGE MVR PORT Right 09/29/2012   Procedure: 25 GAUGE PARS PLANA VITRECTOMY WITH 20 GAUGE MVR PORT;  Surgeon: Hayden Pedro, MD;  Location: [place];  Service: Ophthalmology;  Laterality: Right;   APPENDECTOMY     CARDIAC CATHETERIZATION N/A 07/26/2015   Procedure: Left Heart Cath and Coronary Angiography;  Surgeon: [name], MD;  Location: [place] CV LAB;  Service: Cardiovascular;  Laterality: N/A;   CARDIOVERSION N/A 08/30/2015   Procedure: CARDIOVERSION;  Surgeon: Satira Sark, MD;  [facility];  Service: Cardiovascular;  Laterality: N/A;   CATARACT EXTRACTION W/PHACO Right 05/18/2013   Procedure: CATARACT EXTRACTION PHACO AND INTRAOCULAR LENS PLACEMENT RIGHT EYE;  Surgeon: Tonny Branch, MD;   [facility]: [facility];  Service: Ophthalmology;  Laterality: Right;  CDE:13.35   CATARACT EXTRACTION W/PHACO Left 02/10/2016   Procedure: CATARACT EXTRACTION PHACO AND INTRAOCULAR LENS PLACEMENT LEFT EYE: CDE:  5.39;  Surgeon: Tonny Branch, MD;  [facility]: [facility];  Service: Ophthalmology;  Laterality: Left;   CHOLECYSTECTOMY     COLONOSCOPY N/A 05/02/2018   Procedure: COLONOSCOPY;  Surgeon: Rogene Houston, MD;  [facility];  Service: Endoscopy;  Laterality: N/A;  1:15   ELECTROPHYSIOLOGIC STUDY N/A 11/19/2015   Procedure: Atrial Fibrillation Ablation;  Surgeon: Thompson Grayer, MD;  Location: [place] CV LAB;  Service: Cardiovascular;  Laterality: N/A;   GAS/FLUID EXCHANGE Right 09/29/2012   Procedure: GAS/FLUID EXCHANGE;  Surgeon: Hayden Pedro, MD;  Location: [place];  Service: Ophthalmology;  Laterality: Right;   LASER PHOTO ABLATION Right 09/29/2012   Procedure: LASER PHOTO ABLATION;  Surgeon: Hayden Pedro, MD;  Location: [place];  Service: Ophthalmology;  Laterality: Right;  Headscope laser   MASTECTOMY Left    1997; S/P chemotherapy/radiation   MEMBRANE PEEL Right 09/29/2012   Procedure: MEMBRANE PEEL;  Surgeon: Hayden Pedro, MD;  Location: [place];  Service: Ophthalmology;  Laterality: Right;   POLYPECTOMY  05/02/2018   Procedure: POLYPECTOMY;  Surgeon: Rogene Houston, MD;  [facility];  Service: Endoscopy;;  sigmoid colon times two, transverse colon times one   TEE WITHOUT CARDIOVERSION N/A 11/19/2015  Procedure: TRANSESOPHAGEAL ECHOCARDIOGRAM (TEE);  Surgeon: Thayer Headings, MD;  Location: [facility] ENDOSCOPY;  Service: Cardiovascular;  Laterality: N/A;   TUBAL LIGATION      Current Outpatient Medications  Medication Sig Dispense Refill   acetaminophen (TYLENOL) 500 MG tablet Take 500-1,000 mg by mouth every 6 (six) hours as needed for moderate pain or headache.     cyclobenzaprine (FLEXERIL) 5 MG tablet Take 5 mg by mouth as needed.     furosemide (LASIX) 40 MG tablet  Take 1 tablet (40 mg total) by mouth daily as needed for fluid (or weight gain). 30 tablet 2   hydrochlorothiazide (HYDRODIURIL) 12.5 MG tablet TAKE 1 TABLET BY MOUTH DAILY 90 tablet 2   latanoprost (XALATAN) 0.005 % ophthalmic solution Place 1 drop into both eyes at bedtime.     levothyroxine (SYNTHROID, LEVOTHROID) 125 MCG tablet Take 125 mcg by mouth daily before breakfast.   3   losartan (COZAAR) 100 MG tablet TAKE 1 TABLET BY MOUTH DAILY (CAMBER BRAND: WHITE TABLETS ONLY) 90 tablet 2   metoprolol succinate (TOPROL-XL) 25 MG 24 hr tablet TAKE 1 TABLET BY MOUTH EVERY DAY 90 tablet 3   Multiple Vitamins-Minerals (OCUVITE PRESERVISION PO) Take 1 tablet by mouth 2 (two) times daily.      NITROSTAT 0.4 MG SL tablet PLACE 1 TABLET UNDER THE TONGUE EVERY 5 MINUTES FOR 3 DOSES AS NEEDED CHEST PAIN 25 tablet 3   traMADol (ULTRAM) 50 MG tablet Take 50 mg by mouth 2 (two) times daily.     rivaroxaban (XARELTO) 20 MG TABS tablet TAKE 1 TABLET BY MOUTH EVERY DAY WITH SUPPER 21 tablet 0   No current facility-administered medications for this visit.   Allergies:  Doxycycline   ROS: No orthopnea or PND.  Physical Exam: VS:  BP (!) 142/78   Pulse 62   Ht 5' 3.5" (1.613 m)   Wt 187 lb (84.8 kg)   LMP 09/28/2012   SpO2 98%   BMI 32.61 kg/m , BMI Body mass index is 32.61 kg/m.  Wt Readings from Last 3 Encounters:  01/07/22 187 lb (84.8 kg)  08/22/21 182 lb (82.6 kg)  07/03/21 181 lb 12.8 oz (82.5 kg)    General: Patient appears comfortable at rest. HEENT: Conjunctiva and lids normal. Neck: Supple, no elevated JVP or carotid bruits. Lungs: Clear to auscultation, nonlabored breathing at rest. Cardiac: Regular rate and rhythm, no S3 or significant systolic murmur. Extremities: No pitting edema.  ECG:  An ECG dated 08/22/2021 was personally reviewed today and demonstrated:  Sinus rhythm with left bundle branch block.  Recent Labwork:  January 2023: BUN 13, creatinine 0.82, potassium 4.2, AST  18, ALT 10, TSH 0.58, hemoglobin 13.5, platelets 281  Other Studies Reviewed Today:  Echocardiogram 08/14/2020:  1. Left ventricular ejection fraction, by estimation, is 60 to 65%. The  left ventricle has normal function. The left ventricle has no regional  wall motion abnormalities. Normal global lonigtudinal strain of -17.9%.   2. Right ventricular systolic function is normal. The right ventricular  size is normal. There is normal pulmonary artery systolic pressure. The  estimated right ventricular systolic pressure is 16.1 mmHg.   3. The mitral valve is grossly normal. Mild mitral valve regurgitation.   4. The aortic valve is tricuspid. Aortic valve regurgitation is mild.  Aortic regurgitation PHT measures 1584 msec.   5. The inferior vena cava is normal in size with greater than 50%  respiratory variability, suggesting right atrial pressure of 3 mmHg.  Assessment and Plan:  1.  Persistent atrial fibrillation status post successful atrial fibrillation ablation.  CHA2DS2-VASc score is 5 and she remains on Xarelto as well as Toprol-XL with normal heart rate today.  No progressive sense of palpitations.  Looking into assistance for Xarelto during the donut hole.  Otherwise no changes.  We will have lab work later this year with PCP.  2.  CAD status post DES to the obtuse marginal in 2012.  No angina reported.  She has history of statin intolerance as well as intolerance to Zetia and has not wanted to pursue PCSK9 inhibitors.  Medication Adjustments/Labs and Tests Ordered: Current medicines are reviewed at length with the patient today.  Concerns regarding medicines are outlined above.   Tests Ordered: No orders of the defined types were placed in this encounter.   Medication Changes: Meds ordered this encounter  Medications   rivaroxaban (XARELTO) 20 MG TABS tablet    Sig: TAKE 1 TABLET BY MOUTH EVERY DAY WITH SUPPER    Dispense:  21 tablet    Refill:  0    Lot # [id number] Exp  12/2022    Disposition:  Follow up  6 months.  Signed, Satira Sark, MD, [facility] 01/07/2022 2:18 PM    [facility] [place], [place], [place] [address] Phone: [phone]; Fax: [phone]

## 2022-02-04 ENCOUNTER — Other Ambulatory Visit: Payer: Self-pay | Admitting: Cardiology

## 2022-02-16 DIAGNOSIS — M25562 Pain in left knee: Secondary | ICD-10-CM | POA: Diagnosis not present

## 2022-02-16 DIAGNOSIS — G8929 Other chronic pain: Secondary | ICD-10-CM | POA: Diagnosis not present

## 2022-02-23 DIAGNOSIS — M25562 Pain in left knee: Secondary | ICD-10-CM | POA: Diagnosis not present

## 2022-02-27 DIAGNOSIS — Z0000 Encounter for general adult medical examination without abnormal findings: Secondary | ICD-10-CM | POA: Diagnosis not present

## 2022-03-04 DIAGNOSIS — I10 Essential (primary) hypertension: Secondary | ICD-10-CM | POA: Diagnosis not present

## 2022-03-04 DIAGNOSIS — E039 Hypothyroidism, unspecified: Secondary | ICD-10-CM | POA: Diagnosis not present

## 2022-03-04 DIAGNOSIS — Z0001 Encounter for general adult medical examination with abnormal findings: Secondary | ICD-10-CM | POA: Diagnosis not present

## 2022-03-04 DIAGNOSIS — M47816 Spondylosis without myelopathy or radiculopathy, lumbar region: Secondary | ICD-10-CM | POA: Diagnosis not present

## 2022-03-15 ENCOUNTER — Other Ambulatory Visit: Payer: Self-pay | Admitting: Cardiology

## 2022-04-28 DIAGNOSIS — E7849 Other hyperlipidemia: Secondary | ICD-10-CM | POA: Diagnosis not present

## 2022-04-28 DIAGNOSIS — E039 Hypothyroidism, unspecified: Secondary | ICD-10-CM | POA: Diagnosis not present

## 2022-04-28 DIAGNOSIS — E782 Mixed hyperlipidemia: Secondary | ICD-10-CM | POA: Diagnosis not present

## 2022-05-05 DIAGNOSIS — E039 Hypothyroidism, unspecified: Secondary | ICD-10-CM | POA: Diagnosis not present

## 2022-05-05 DIAGNOSIS — M47816 Spondylosis without myelopathy or radiculopathy, lumbar region: Secondary | ICD-10-CM | POA: Diagnosis not present

## 2022-05-05 DIAGNOSIS — I10 Essential (primary) hypertension: Secondary | ICD-10-CM | POA: Diagnosis not present

## 2022-05-05 DIAGNOSIS — G8929 Other chronic pain: Secondary | ICD-10-CM | POA: Diagnosis not present

## 2022-05-13 DIAGNOSIS — Z961 Presence of intraocular lens: Secondary | ICD-10-CM | POA: Diagnosis not present

## 2022-05-13 DIAGNOSIS — H401112 Primary open-angle glaucoma, right eye, moderate stage: Secondary | ICD-10-CM | POA: Diagnosis not present

## 2022-05-13 DIAGNOSIS — Z9849 Cataract extraction status, unspecified eye: Secondary | ICD-10-CM | POA: Diagnosis not present

## 2022-06-18 ENCOUNTER — Other Ambulatory Visit: Payer: Self-pay | Admitting: Cardiology

## 2022-06-18 DIAGNOSIS — I4819 Other persistent atrial fibrillation: Secondary | ICD-10-CM

## 2022-06-19 NOTE — Telephone Encounter (Signed)
Prescription refill request for Xarelto received.  Indication: Afib  Last office visit: 01/07/22 Diona Browner)  Weight: 84.8kg Age: 77 Scr: 0.85 (06/12/21 via [facility])  CrCl: 76.62ml/min   Labs overdue. Pt has scheduled appt with Dr Diona Browner on 07/29/22. Note placed on upcoming appt to have labs drawn at appt.   Appropriate dose. Refill sent.

## 2022-07-14 DIAGNOSIS — E782 Mixed hyperlipidemia: Secondary | ICD-10-CM | POA: Diagnosis not present

## 2022-07-14 DIAGNOSIS — I4891 Unspecified atrial fibrillation: Secondary | ICD-10-CM | POA: Diagnosis not present

## 2022-07-16 DIAGNOSIS — H401112 Primary open-angle glaucoma, right eye, moderate stage: Secondary | ICD-10-CM | POA: Diagnosis not present

## 2022-07-29 ENCOUNTER — Ambulatory Visit: Payer: Medicare Other | Attending: Cardiology | Admitting: Cardiology

## 2022-07-29 ENCOUNTER — Encounter: Payer: Self-pay | Admitting: *Deleted

## 2022-07-29 ENCOUNTER — Encounter: Payer: Self-pay | Admitting: Cardiology

## 2022-07-29 VITALS — BP 128/72 | HR 68 | Ht 63.5 in | Wt 186.6 lb

## 2022-07-29 DIAGNOSIS — I25119 Atherosclerotic heart disease of native coronary artery with unspecified angina pectoris: Secondary | ICD-10-CM | POA: Diagnosis not present

## 2022-07-29 DIAGNOSIS — I10 Essential (primary) hypertension: Secondary | ICD-10-CM

## 2022-07-29 DIAGNOSIS — M791 Myalgia, unspecified site: Secondary | ICD-10-CM

## 2022-07-29 DIAGNOSIS — E782 Mixed hyperlipidemia: Secondary | ICD-10-CM

## 2022-07-29 DIAGNOSIS — I4819 Other persistent atrial fibrillation: Secondary | ICD-10-CM

## 2022-07-29 DIAGNOSIS — T466X5D Adverse effect of antihyperlipidemic and antiarteriosclerotic drugs, subsequent encounter: Secondary | ICD-10-CM

## 2022-07-29 DIAGNOSIS — T466X5A Adverse effect of antihyperlipidemic and antiarteriosclerotic drugs, initial encounter: Secondary | ICD-10-CM

## 2022-07-29 NOTE — Progress Notes (Signed)
    Cardiology Office Note  Date: 07/29/2022   ID: [name] Fry, DOB 09-30-1945, MRN [id number]  History of Present Illness: [name] Fry is a 77 y.o. female last seen in October 2023.  She is here for a routine visit.  She does not report any sense of palpitations, no exertional chest pain or worsening dyspnea with typical activities.  She has had some recent seasonal allergy symptoms.  I reviewed her medications.  She reports no spontaneous bleeding problems on Xarelto, also continues on Toprol-XL.  ECG today shows sinus rhythm with left bundle branch block.  She is now on Lopid, history of both statin and Zetia intolerance.  She has not been interested in PCSK9 inhibitors.  Physical Exam: VS:  BP 128/72 (BP Location: Right Arm)   Pulse 68   Ht 5' 3.5" (1.613 m)   Wt 186 lb 9.6 oz (84.6 kg)   LMP 09/28/2012   SpO2 98%   BMI 32.54 kg/m , BMI Body mass index is 32.54 kg/m.  Wt Readings from Last 3 Encounters:  07/29/22 186 lb 9.6 oz (84.6 kg)  01/07/22 187 lb (84.8 kg)  08/22/21 182 lb (82.6 kg)    General: Patient appears comfortable at rest. HEENT: Conjunctiva and lids normal. Neck: Supple, no elevated JVP or carotid bruits. Lungs: Clear to auscultation, nonlabored breathing at rest. Cardiac: Regular rate and rhythm, no S3, 1/6 systolic murmur. Extremities: No pitting edema.  ECG:  An ECG dated 08/22/2021 was personally reviewed today and demonstrated:  Sinus rhythm with left bundle branch block.  Labwork:  March 2023: BUN 9, creatinine 0.85, potassium 4.1, AST 17, ALT 25 April 2022: Cholesterol 190, triglycerides 185, HDL 53, LDL 105, TSH 0.586  Other Studies Reviewed Today:  No interval cardiac testing for review today.  Assessment and Plan:  1.  Persistent atrial fibrillation status post atrial fibrillation ablation with CHA2DS2-VASc score of 5.  She remains on Xarelto.  She has done well with no major recurrences, ECG today shows sinus rhythm with left bundle  branch block.  Update CBC and BMET, no changes in therapy for now.  2.  CAD status post DES to the obtuse marginal in 2012.  She reports no active angina, not on aspirin given use of Xarelto.  3.  Mixed hyperlipidemia with statin myalgias.  She also has intolerance to Zetia and has declined consideration of PCSK9 inhibitors.  LDL 105 in February.  She is currently on Lopid 600 mg twice daily per PCP.  4.  Essential hypertension.  Recheck blood pressure today was reasonable, continue Cozaar, HCTZ, and Toprol-XL.  Disposition:  Follow up  6 months.  Signed, Jonelle Sidle, M.D., F.A.C.C. [facility] at [facility]

## 2022-07-29 NOTE — Patient Instructions (Addendum)

## 2022-08-21 ENCOUNTER — Ambulatory Visit: Payer: Medicare Other | Admitting: Internal Medicine

## 2022-08-26 DIAGNOSIS — E039 Hypothyroidism, unspecified: Secondary | ICD-10-CM | POA: Diagnosis not present

## 2022-08-26 DIAGNOSIS — I10 Essential (primary) hypertension: Secondary | ICD-10-CM | POA: Diagnosis not present

## 2022-09-02 DIAGNOSIS — E039 Hypothyroidism, unspecified: Secondary | ICD-10-CM | POA: Diagnosis not present

## 2022-09-02 DIAGNOSIS — I10 Essential (primary) hypertension: Secondary | ICD-10-CM | POA: Diagnosis not present

## 2022-09-02 DIAGNOSIS — G8929 Other chronic pain: Secondary | ICD-10-CM | POA: Diagnosis not present

## 2022-09-02 DIAGNOSIS — M47816 Spondylosis without myelopathy or radiculopathy, lumbar region: Secondary | ICD-10-CM | POA: Diagnosis not present

## 2022-09-11 ENCOUNTER — Other Ambulatory Visit: Payer: Self-pay | Admitting: Cardiology

## 2022-09-11 DIAGNOSIS — I4819 Other persistent atrial fibrillation: Secondary | ICD-10-CM

## 2022-09-11 NOTE — Telephone Encounter (Signed)
Prescription refill request for Xarelto received.  Indication: Afib  Last office visit: 07/29/22 Diona Browner)  Weight: 84.6kg Age: 77 Scr: 1.01 (08/26/22)  CrCl: 62.79ml/min  Appropriate dose. Refill sent.

## 2022-09-12 ENCOUNTER — Other Ambulatory Visit: Payer: Self-pay | Admitting: Cardiology

## 2022-09-18 ENCOUNTER — Encounter: Payer: Self-pay | Admitting: Cardiovascular Disease

## 2022-09-18 ENCOUNTER — Ambulatory Visit: Payer: Medicare Other | Admitting: Cardiovascular Disease

## 2022-09-18 ENCOUNTER — Ambulatory Visit: Payer: Medicare Other | Attending: Cardiovascular Disease | Admitting: Cardiovascular Disease

## 2022-09-18 VITALS — BP 142/88 | HR 62 | Ht 63.75 in | Wt 188.8 lb

## 2022-09-18 DIAGNOSIS — I2510 Atherosclerotic heart disease of native coronary artery without angina pectoris: Secondary | ICD-10-CM

## 2022-09-18 DIAGNOSIS — I4819 Other persistent atrial fibrillation: Secondary | ICD-10-CM | POA: Diagnosis not present

## 2022-09-18 NOTE — Progress Notes (Signed)
   PCP: Lawerance Sabal, PA Primary Cardiologist: Dr Diona Browner Primary EP: Dr Nelly Laurence  [name] Fry is a 77 y.o. female who presents today for routine electrophysiology followup.  Since last being seen in our clinic, the patient reports doing very well.   I reviewed Dr. Ival Bible note from May and the EKG from that visit.   Today, she denies symptoms of palpitations, chest pain, shortness of breath,  lower extremity edema, dizziness, presyncope, or syncope.  The patient is otherwise without complaint today.     Physical Exam: Vitals:   09/18/22 1037  BP: (!) 142/88  Pulse: 62  SpO2: 99%  Weight: 188 lb 12.8 oz (85.6 kg)  Height: 5' 3.75" (1.619 m)    Gen: Appears comfortable, well-nourished CV: RRR, no dependent edema Pulm: breathing easily   Wt Readings from Last 3 Encounters:  09/18/22 188 lb 12.8 oz (85.6 kg)  07/29/22 186 lb 9.6 oz (84.6 kg)  01/07/22 187 lb (84.8 kg)    EKG tracing 07/29/22 is personally reviewed and shows sinus  Assessment and Plan:  Persistent afib Well controlled post ablation off AAD therapy Chads2vasc score is 5.  She is on xarelto 20 She would like to stop OAC due to costs.  I discussed watchman as an option.  She would prefer to continue xarelto for now.  2. CAD No ischemic symtoms  3. HTN Stable No change required today Labs 6/24 reviewed  4. LBBB - old  Return in 6 months  Maurice Small, MD 09/18/2022 10:54 AM

## 2022-09-18 NOTE — Patient Instructions (Addendum)
Medication Instructions:  Continue all current medications.   Labwork: none  Testing/Procedures: none  Follow-Up: 6 months   Any Other Special Instructions Will Be Listed Below (If Applicable).   If you need a refill on your cardiac medications before your next appointment, please call your pharmacy.  

## 2022-10-26 DIAGNOSIS — H524 Presbyopia: Secondary | ICD-10-CM | POA: Diagnosis not present

## 2022-10-26 DIAGNOSIS — H401112 Primary open-angle glaucoma, right eye, moderate stage: Secondary | ICD-10-CM | POA: Diagnosis not present

## 2022-12-01 DIAGNOSIS — I10 Essential (primary) hypertension: Secondary | ICD-10-CM | POA: Diagnosis not present

## 2022-12-01 DIAGNOSIS — G8929 Other chronic pain: Secondary | ICD-10-CM | POA: Diagnosis not present

## 2022-12-01 DIAGNOSIS — M47816 Spondylosis without myelopathy or radiculopathy, lumbar region: Secondary | ICD-10-CM | POA: Diagnosis not present

## 2022-12-11 ENCOUNTER — Other Ambulatory Visit: Payer: Self-pay | Admitting: Cardiology

## 2022-12-16 DIAGNOSIS — R110 Nausea: Secondary | ICD-10-CM | POA: Diagnosis not present

## 2022-12-16 DIAGNOSIS — R519 Headache, unspecified: Secondary | ICD-10-CM | POA: Diagnosis not present

## 2022-12-16 DIAGNOSIS — R0981 Nasal congestion: Secondary | ICD-10-CM | POA: Diagnosis not present

## 2022-12-16 DIAGNOSIS — U071 COVID-19: Secondary | ICD-10-CM | POA: Diagnosis not present

## 2022-12-16 DIAGNOSIS — I10 Essential (primary) hypertension: Secondary | ICD-10-CM | POA: Diagnosis not present

## 2023-01-19 ENCOUNTER — Ambulatory Visit: Payer: Medicare Other | Attending: Cardiology | Admitting: Cardiology

## 2023-01-19 ENCOUNTER — Encounter: Payer: Self-pay | Admitting: Cardiology

## 2023-01-19 VITALS — BP 138/80 | HR 64 | Ht 63.0 in | Wt 183.2 lb

## 2023-01-19 DIAGNOSIS — T466X5D Adverse effect of antihyperlipidemic and antiarteriosclerotic drugs, subsequent encounter: Secondary | ICD-10-CM | POA: Diagnosis not present

## 2023-01-19 DIAGNOSIS — E782 Mixed hyperlipidemia: Secondary | ICD-10-CM | POA: Diagnosis not present

## 2023-01-19 DIAGNOSIS — M791 Myalgia, unspecified site: Secondary | ICD-10-CM

## 2023-01-19 DIAGNOSIS — I4819 Other persistent atrial fibrillation: Secondary | ICD-10-CM | POA: Diagnosis not present

## 2023-01-19 DIAGNOSIS — I25119 Atherosclerotic heart disease of native coronary artery with unspecified angina pectoris: Secondary | ICD-10-CM

## 2023-01-19 NOTE — Patient Instructions (Addendum)

## 2023-01-19 NOTE — Progress Notes (Signed)
    Cardiology Office Note  Date: 01/19/2023   ID: [name] Fry, DOB [birth year], MRN [id number]  History of Present Illness: [name] Fry is a 77 y.o. female last seen in May.  She is here for a routine visit.  Reports no angina or palpitations.  No change in stamina.  She had a visit with Dr. Nelly Laurence in July, I reviewed the note.  I reviewed her medications.  Current cardiac regimen includes Xarelto, Toprol-XL, Cozaar, HCTZ, and as needed nitroglycerin.  She stopped Lopid about 2 months ago complaining of leg pain which has subsequently resolved.  Plans to rechallenge and then discussed with PCP.  I did talk with her about the possibility of considering Nexletol.  She has prior intolerances to statins and Zetia.  She is also not interested in PCSK9 inhibitors.  Physical Exam: VS:  BP 138/80   Pulse 64   Ht 5\' 3"  (1.6 m)   Wt 183 lb 3.2 oz (83.1 kg)   LMP 09/28/2012   SpO2 96%   BMI 32.45 kg/m , BMI Body mass index is 32.45 kg/m.  Wt Readings from Last 3 Encounters:  01/19/23 183 lb 3.2 oz (83.1 kg)  09/18/22 188 lb 12.8 oz (85.6 kg)  07/29/22 186 lb 9.6 oz (84.6 kg)    General: Patient appears comfortable at rest. HEENT: Conjunctiva and lids normal. Neck: Supple, no elevated JVP or carotid bruits. Lungs: Clear to auscultation, nonlabored breathing at rest. Cardiac: Regular rate and rhythm, no S3, 1/6 systolic murmur, no pericardial rub. Extremities: No pitting edema.  ECG:  An ECG dated 07/29/2022 was personally reviewed today and demonstrated:  Sinus rhythm with left bundle-branch block.  Labwork:  February 2024: Cholesterol 190, triglycerides 185, HDL 53, LDL 105 June 2024: TSH 0.679  Other Studies Reviewed Today:  No interval cardiac testing for review today.  Assessment and Plan:  1.  Persistent atrial fibrillation status post atrial fibrillation ablation with CHA2DS2-VASc score of 5.  She has done well without obvious recurring arrhythmia.  Continue Toprol-XL and  Xarelto as before.  She does not report any spontaneous bleeding problems.   2.  CAD status post DES to the obtuse marginal in 2012.  No angina or nitroglycerin use.   3.  Mixed hyperlipidemia with statin myalgias.  She also has intolerance to Zetia and has declined consideration of PCSK9 inhibitors.  Potentially having some side effects related to Lopid as discussed above.  I did talk with her about considering Nexletol.   4.  Primary hypertension.  No changes made to current regimen, continue Toprol-XL, Cozaar, and HCTZ.  Keep follow-up with PCP in December.  Disposition:  Follow up  6 months.  Signed, Jonelle Sidle, M.D., F.A.C.C. [facility]

## 2023-01-26 DIAGNOSIS — H401112 Primary open-angle glaucoma, right eye, moderate stage: Secondary | ICD-10-CM | POA: Diagnosis not present

## 2023-03-03 DIAGNOSIS — I10 Essential (primary) hypertension: Secondary | ICD-10-CM | POA: Diagnosis not present

## 2023-03-03 DIAGNOSIS — Z0001 Encounter for general adult medical examination with abnormal findings: Secondary | ICD-10-CM | POA: Diagnosis not present

## 2023-03-03 DIAGNOSIS — M47816 Spondylosis without myelopathy or radiculopathy, lumbar region: Secondary | ICD-10-CM | POA: Diagnosis not present

## 2023-03-03 DIAGNOSIS — G8929 Other chronic pain: Secondary | ICD-10-CM | POA: Diagnosis not present

## 2023-03-18 ENCOUNTER — Other Ambulatory Visit: Payer: Self-pay | Admitting: Cardiology

## 2023-03-18 DIAGNOSIS — I4819 Other persistent atrial fibrillation: Secondary | ICD-10-CM

## 2023-03-18 NOTE — Telephone Encounter (Signed)
 Prescription refill request for Xarelto  received.  Indication: PAF Last office visit: 01/19/23  GORMAN Sierras MD Weight: 83.1kg Age: 78 Scr: 1.01 on 08/26/22  Epic CrCl: 60.22  Based on above findings Xarelto  20mg  daily is the appropriate dose.  Refill approved.

## 2023-04-05 ENCOUNTER — Telehealth: Payer: Self-pay | Admitting: Cardiology

## 2023-04-05 NOTE — Telephone Encounter (Signed)
Advised patient that the reason her medication is so high is that there is a deductible added and will need to be paid before it kicks in the copay amount. Advise that she can call insurance, but is due to new year starting over. Patient verbalized understanding

## 2023-04-05 NOTE — Telephone Encounter (Signed)
Pt c/o medication issue:  1. Name of Medication: XARELTO 20 MG TABS tablet   2. How are you currently taking this medication (dosage and times per day)?   3. Are you having a reaction (difficulty breathing--STAT)?   4. What is your medication issue? Patient is requesting call back to discuss the price of this medication and other options. She states this medication is now over $300 monthly.  Please advise.

## 2023-04-23 ENCOUNTER — Ambulatory Visit: Payer: Medicare Other | Admitting: Cardiovascular Disease

## 2023-04-30 ENCOUNTER — Ambulatory Visit: Payer: Medicare Other | Admitting: Cardiovascular Disease

## 2023-05-11 DIAGNOSIS — H401112 Primary open-angle glaucoma, right eye, moderate stage: Secondary | ICD-10-CM | POA: Diagnosis not present

## 2023-06-03 DIAGNOSIS — Z13220 Encounter for screening for lipoid disorders: Secondary | ICD-10-CM | POA: Diagnosis not present

## 2023-06-03 DIAGNOSIS — I10 Essential (primary) hypertension: Secondary | ICD-10-CM | POA: Diagnosis not present

## 2023-06-03 DIAGNOSIS — E559 Vitamin D deficiency, unspecified: Secondary | ICD-10-CM | POA: Diagnosis not present

## 2023-06-03 DIAGNOSIS — D519 Vitamin B12 deficiency anemia, unspecified: Secondary | ICD-10-CM | POA: Diagnosis not present

## 2023-06-03 DIAGNOSIS — E039 Hypothyroidism, unspecified: Secondary | ICD-10-CM | POA: Diagnosis not present

## 2023-06-08 ENCOUNTER — Other Ambulatory Visit: Payer: Self-pay | Admitting: Cardiology

## 2023-06-11 DIAGNOSIS — C50912 Malignant neoplasm of unspecified site of left female breast: Secondary | ICD-10-CM | POA: Diagnosis not present

## 2023-06-17 DIAGNOSIS — E785 Hyperlipidemia, unspecified: Secondary | ICD-10-CM | POA: Diagnosis not present

## 2023-06-17 DIAGNOSIS — E559 Vitamin D deficiency, unspecified: Secondary | ICD-10-CM | POA: Diagnosis not present

## 2023-06-17 DIAGNOSIS — E039 Hypothyroidism, unspecified: Secondary | ICD-10-CM | POA: Diagnosis not present

## 2023-07-20 DIAGNOSIS — C50912 Malignant neoplasm of unspecified site of left female breast: Secondary | ICD-10-CM | POA: Diagnosis not present

## 2023-08-17 DIAGNOSIS — E039 Hypothyroidism, unspecified: Secondary | ICD-10-CM | POA: Diagnosis not present

## 2023-08-17 DIAGNOSIS — E559 Vitamin D deficiency, unspecified: Secondary | ICD-10-CM | POA: Diagnosis not present

## 2023-08-17 DIAGNOSIS — I10 Essential (primary) hypertension: Secondary | ICD-10-CM | POA: Diagnosis not present

## 2023-08-17 DIAGNOSIS — E7849 Other hyperlipidemia: Secondary | ICD-10-CM | POA: Diagnosis not present

## 2023-08-30 DIAGNOSIS — R079 Chest pain, unspecified: Secondary | ICD-10-CM | POA: Diagnosis not present

## 2023-08-30 DIAGNOSIS — I10 Essential (primary) hypertension: Secondary | ICD-10-CM | POA: Diagnosis not present

## 2023-08-30 DIAGNOSIS — E039 Hypothyroidism, unspecified: Secondary | ICD-10-CM | POA: Diagnosis not present

## 2023-08-30 DIAGNOSIS — M940 Chondrocostal junction syndrome [Tietze]: Secondary | ICD-10-CM | POA: Diagnosis not present

## 2023-09-05 ENCOUNTER — Other Ambulatory Visit: Payer: Self-pay | Admitting: Cardiology

## 2023-09-07 DIAGNOSIS — J209 Acute bronchitis, unspecified: Secondary | ICD-10-CM | POA: Diagnosis not present

## 2023-09-07 DIAGNOSIS — J019 Acute sinusitis, unspecified: Secondary | ICD-10-CM | POA: Diagnosis not present

## 2023-09-08 ENCOUNTER — Other Ambulatory Visit: Payer: Self-pay | Admitting: Cardiology

## 2023-09-08 DIAGNOSIS — I4819 Other persistent atrial fibrillation: Secondary | ICD-10-CM

## 2023-09-08 NOTE — Telephone Encounter (Signed)
 Prescription refill request for Xarelto  received.  Indication: AF Last office visit: 01/19/23  GORMAN Sierras MD Weight: 83.1kg Age: 78 Scr: 1.01 on 08/26/22 CrCl: 60.22  Based on above findings Xarelto  20mg  daily is the appropriate dose.  Refill approved.

## 2023-11-11 ENCOUNTER — Encounter: Payer: Self-pay | Admitting: Cardiology

## 2023-11-11 ENCOUNTER — Ambulatory Visit: Attending: Cardiology | Admitting: Cardiology

## 2023-11-11 VITALS — BP 128/70 | HR 65 | Ht 63.75 in | Wt 182.2 lb

## 2023-11-11 DIAGNOSIS — I25119 Atherosclerotic heart disease of native coronary artery with unspecified angina pectoris: Secondary | ICD-10-CM

## 2023-11-11 DIAGNOSIS — M79604 Pain in right leg: Secondary | ICD-10-CM | POA: Diagnosis not present

## 2023-11-11 DIAGNOSIS — M79605 Pain in left leg: Secondary | ICD-10-CM

## 2023-11-11 DIAGNOSIS — E782 Mixed hyperlipidemia: Secondary | ICD-10-CM

## 2023-11-11 DIAGNOSIS — I4819 Other persistent atrial fibrillation: Secondary | ICD-10-CM | POA: Diagnosis not present

## 2023-11-11 MED ORDER — NEXLETOL 180 MG PO TABS: 1.0000 | ORAL_TABLET | Freq: Every day | ORAL | 6 refills | Status: DC

## 2023-11-11 NOTE — Patient Instructions (Addendum)
 Medication Instructions:  Your physician has recommended you make the following change in your medication:  Start nexletol  180 mg daily Continue all other medications as prescribed  Labwork: Fasting lipid panel in 6 months if you are able to take nexletol . Please do not eat or drink for at least 8 hours when you have this done. You may take your medications that morning with a sip of water .  Testing/Procedures: Your physician has requested that you have an ankle brachial index (ABI). During this test an ultrasound and blood pressure cuff are used to evaluate the arteries that supply the arms and legs with blood. Allow thirty minutes for this exam. There are no restrictions or special instructions.  Please note: We ask at that you not bring children with you during ultrasound (echo/ vascular) testing. Due to room size and safety concerns, children are not allowed in the ultrasound rooms during exams. Our front office staff cannot provide observation of children in our lobby area while testing is being conducted. An adult accompanying a patient to their appointment will only be allowed in the ultrasound room at the discretion of the ultrasound technician under special circumstances. We apologize for any inconvenience.  Follow-Up: Your physician recommends that you schedule a follow-up appointment in: 6 months  Any Other Special Instructions Will Be Listed Below (If Applicable).  If you need a refill on your cardiac medications before your next appointment, please call your pharmacy.

## 2023-11-11 NOTE — Progress Notes (Signed)
    Cardiology Office Note  Date: 11/11/2023   ID: Kharter, Sestak 08-19-1945, MRN [id number]  History of Present Illness: [name] Fry is a 78 y.o. female last seen in November 2024.  She is here with family member for a follow-up visit.  She states that she experienced a recurring sense of chest soreness back in May, lasted for about 4 weeks and potentially related to muscle strain.  This has not been a recurring symptom since that time.  She also reports chronic bilateral leg pain, generally an achiness most of the time, not specifically exertional.  She has not been evaluated for PAD.  Symptoms could potentially be neuropathic however.  We discussed her medications.  Current cardiac regimen includes Xarelto  20 mg daily, as needed nitroglycerin , Toprol -XL 25 mg daily, losartan  100 mg daily, and HCTZ 12.5 mg daily.  Blood pressure was initially elevated at presentation, rechecked by me at 128/70.  I reviewed her most recent lipid panel, LDL 105 as of February 2024.  I reviewed her ECG today which shows sinus rhythm with left bundle branch block.  Physical Exam: VS:  BP 128/70   Pulse 65   Ht 5' 3.75 (1.619 m)   Wt 182 lb 3.2 oz (82.6 kg)   LMP 09/28/2012   SpO2 98%   BMI 31.52 kg/m , BMI Body mass index is 31.52 kg/m.  Wt Readings from Last 3 Encounters:  11/11/23 182 lb 3.2 oz (82.6 kg)  01/19/23 183 lb 3.2 oz (83.1 kg)  09/18/22 188 lb 12.8 oz (85.6 kg)    General: Patient appears comfortable at rest. HEENT: Conjunctiva and lids normal. Neck: Supple, no elevated JVP or carotid bruits. Lungs: Clear to auscultation, nonlabored breathing at rest. Cardiac: Regular rate and rhythm, no S3, 1/6 systolic murmur. Extremities: No pitting edema.  ECG:  An ECG dated 07/29/2022 was personally reviewed today and demonstrated:  Sinus rhythm with left bundle branch block.  Labwork:  February 2024: Cholesterol 190, triglycerides 185, HDL 53, LDL 105 June 2024: Potassium 4.3, BUN 19,  creatinine 1.01, GFR 57, AST 16, ALT 25 May 2023: TSH 6.06  Other Studies Reviewed Today:  No interval cardiac testing for review today.  Assessment and Plan:  1.  Persistent atrial fibrillation status post atrial fibrillation ablation with CHA2DS2-VASc score of 5.  She is symptomatically stable and in sinus rhythm by ECG today.  Continue Toprol -XL 25 mg daily and Xarelto  20 mg daily.  Creatinine clearance 59.   2.  CAD status post DES to the obtuse marginal in 2012.  She does not report any angina.  As needed nitroglycerin  available.   3.  Mixed hyperlipidemia with statin myalgias.  She also has intolerance to Zetia  and has declined consideration of PCSK9 inhibitors.  We will trial bempedoic acid  180 mg daily..  If she tolerates, recheck FLP in 6 months.   4.  Primary hypertension.  Continue with present regimen.  5.  Bilateral leg pain.  Obtain screening ABIs to assess for PAD.  Symptoms sound potentially more neuropathic however.  Disposition:  Follow up 6 months.  Signed, Jayson JUDITHANN Sierras, M.D., F.A.C.C. [facility] at [facility]

## 2023-11-12 ENCOUNTER — Other Ambulatory Visit (HOSPITAL_COMMUNITY): Payer: Self-pay

## 2023-11-12 ENCOUNTER — Telehealth: Payer: Self-pay | Admitting: Pharmacy Technician

## 2023-11-12 DIAGNOSIS — E782 Mixed hyperlipidemia: Secondary | ICD-10-CM

## 2023-11-12 DIAGNOSIS — Z79899 Other long term (current) drug therapy: Secondary | ICD-10-CM

## 2023-11-12 NOTE — Telephone Encounter (Signed)
 Pharmacy Patient Advocate Encounter Received notification from CoverMyMeds that prior authorization for NEXLETOL  is required/requested.   Insurance verification completed.   The patient is insured through [facility] .   Per test claim: PA required; PA started via CoverMyMeds. KEY [id number] . Please see clinical question(s) below that I am not finding the answer to in their chart and advise.   Insurance is asking for labs done in the last 120 days. Last labs look to be done in 04/2022. Can she get new labs?

## 2023-11-18 ENCOUNTER — Encounter: Payer: Self-pay | Admitting: *Deleted

## 2023-11-18 NOTE — Telephone Encounter (Signed)
 Multiple attempts to reach by phone with no success. Letter mailed requesting patient to call office to discuss.

## 2023-11-23 ENCOUNTER — Telehealth: Payer: Self-pay | Admitting: Cardiology

## 2023-11-23 NOTE — Telephone Encounter (Signed)
 Pt said [facility] faxed us  for a Prior Auth on a medication and they will have to order the medication as well

## 2023-12-02 ENCOUNTER — Ambulatory Visit: Attending: Cardiology

## 2023-12-02 DIAGNOSIS — M79604 Pain in right leg: Secondary | ICD-10-CM | POA: Diagnosis not present

## 2023-12-02 DIAGNOSIS — M79605 Pain in left leg: Secondary | ICD-10-CM | POA: Diagnosis not present

## 2023-12-03 ENCOUNTER — Ambulatory Visit: Payer: Self-pay | Admitting: Cardiology

## 2023-12-03 DIAGNOSIS — H401112 Primary open-angle glaucoma, right eye, moderate stage: Secondary | ICD-10-CM | POA: Diagnosis not present

## 2023-12-03 DIAGNOSIS — H524 Presbyopia: Secondary | ICD-10-CM | POA: Diagnosis not present

## 2023-12-03 LAB — VAS US ABI WITH/WO TBI
Left ABI: 1.08
Right ABI: 0.85

## 2023-12-05 ENCOUNTER — Other Ambulatory Visit: Payer: Self-pay | Admitting: Cardiology

## 2023-12-06 ENCOUNTER — Encounter: Payer: Self-pay | Admitting: *Deleted

## 2023-12-06 NOTE — Telephone Encounter (Signed)
 Advised that FLP needed for nexletol  prior auth. Reports last lipid panel was done at [facility] on 08/17/2023. Request sent to PCP for last lipid panel.

## 2023-12-07 ENCOUNTER — Encounter: Payer: Self-pay | Admitting: Family Medicine

## 2023-12-07 NOTE — Telephone Encounter (Signed)
Patient informed and verbalized understanding of plan. Lab order faxed to [facility] 

## 2023-12-07 NOTE — Telephone Encounter (Signed)
 08/17/2023 Lipid panel done by PCP is available for review.

## 2023-12-07 NOTE — Addendum Note (Signed)
 Addended by: Ahriyah Vannest M on: 12/07/2023 10:24 AM   Modules accepted: Orders

## 2023-12-08 ENCOUNTER — Telehealth: Payer: Self-pay | Admitting: Cardiology

## 2023-12-08 NOTE — Telephone Encounter (Signed)
 Pt is wondering if her lab orders can be sent to [facility] in [facility]. Please advise.

## 2023-12-08 NOTE — Telephone Encounter (Signed)
 Called patient advised her that I have routed her orders to [facility]. No further issues

## 2023-12-16 DIAGNOSIS — E039 Hypothyroidism, unspecified: Secondary | ICD-10-CM | POA: Diagnosis not present

## 2023-12-16 DIAGNOSIS — G8929 Other chronic pain: Secondary | ICD-10-CM | POA: Diagnosis not present

## 2023-12-16 DIAGNOSIS — R42 Dizziness and giddiness: Secondary | ICD-10-CM | POA: Diagnosis not present

## 2023-12-16 DIAGNOSIS — M47816 Spondylosis without myelopathy or radiculopathy, lumbar region: Secondary | ICD-10-CM | POA: Diagnosis not present

## 2023-12-16 DIAGNOSIS — I10 Essential (primary) hypertension: Secondary | ICD-10-CM | POA: Diagnosis not present

## 2023-12-17 ENCOUNTER — Other Ambulatory Visit: Payer: Self-pay | Admitting: Family Medicine

## 2023-12-17 DIAGNOSIS — R42 Dizziness and giddiness: Secondary | ICD-10-CM

## 2023-12-21 ENCOUNTER — Other Ambulatory Visit (HOSPITAL_COMMUNITY): Payer: Self-pay

## 2023-12-21 NOTE — Telephone Encounter (Signed)
 Pharmacy Patient Advocate Encounter   Received notification from Physician's Office that prior authorization for NEXLETOL  is required/requested.   Insurance verification completed.   The patient is insured through [facility].   Per test claim: PA required; PA submitted to above mentioned insurance via Latent Key/confirmation #/EOC [facility] Status is pending

## 2023-12-22 ENCOUNTER — Other Ambulatory Visit (HOSPITAL_COMMUNITY): Payer: Self-pay

## 2023-12-22 ENCOUNTER — Encounter: Payer: Self-pay | Admitting: *Deleted

## 2023-12-22 NOTE — Telephone Encounter (Signed)
 Pharmacy Patient Advocate Encounter  Received notification from [employer] that Prior Authorization for NEXLETOL  has been APPROVED from 12/21/23 to 06/20/24. Ran test claim, Copay is $0. This test claim was processed through [facility]- copay amounts may vary at other pharmacies due to pharmacy/plan contracts, or as the patient moves through the different stages of their insurance plan.

## 2023-12-22 NOTE — Telephone Encounter (Signed)
 Faxed to [facility].

## 2023-12-29 ENCOUNTER — Telehealth: Payer: Self-pay | Admitting: Cardiology

## 2023-12-29 MED ORDER — NEXLETOL 180 MG PO TABS: 1.0000 | ORAL_TABLET | Freq: Every day | ORAL | 3 refills | Status: AC

## 2023-12-29 NOTE — Telephone Encounter (Signed)
*  STAT* If patient is at the pharmacy, call can be transferred to refill team.   1. Which medications need to be refilled? (please list name of each medication and dose if known)   Bempedoic Acid  (NEXLETOL ) 180 MG TABS    2. Which pharmacy/location (including street and city if local pharmacy) is medication to be sent to?  [facility] - [place], [place] - [address]    3. Do they need a 30 day or 90 day supply? 90

## 2023-12-29 NOTE — Telephone Encounter (Signed)
 RX sent in

## 2024-01-05 DIAGNOSIS — R42 Dizziness and giddiness: Secondary | ICD-10-CM | POA: Diagnosis not present

## 2024-01-05 DIAGNOSIS — R519 Headache, unspecified: Secondary | ICD-10-CM | POA: Diagnosis not present

## 2024-03-05 ENCOUNTER — Other Ambulatory Visit: Payer: Self-pay | Admitting: Cardiology

## 2024-03-05 DIAGNOSIS — I4819 Other persistent atrial fibrillation: Secondary | ICD-10-CM

## 2024-03-06 NOTE — Telephone Encounter (Signed)
 Pt last saw Dr Debera 11/11/23, last labs 12/16/23 Creat 0.78, age 78, weight 82.6kg, 76.26, based on CrCl pt is on appropriate dosage of Xarelto  20mg  every day for afib.  Will refill rx.
